# Patient Record
Sex: Female | Born: 1945 | Race: White | Hispanic: No | Marital: Married | Smoking: Never smoker
Health system: Southern US, Community
[De-identification: ages and names within clinical notes are randomized; demographics above are authoritative.]

## PROBLEM LIST (undated history)

## (undated) DIAGNOSIS — C801 Malignant (primary) neoplasm, unspecified: Secondary | ICD-10-CM

## (undated) DIAGNOSIS — E785 Hyperlipidemia, unspecified: Secondary | ICD-10-CM

## (undated) HISTORY — DX: Hyperlipidemia, unspecified: E78.5

## (undated) HISTORY — PX: TONSILLECTOMY: SUR1361

---

## 2015-01-31 ENCOUNTER — Ambulatory Visit
Admission: EM | Admit: 2015-01-31 | Discharge: 2015-01-31 | Disposition: A | Discharge status: Home / Self Care | Payer: Medicare Other

## 2020-12-24 NOTE — Progress Notes (Signed)
 Results reviewed with patient on the evening of 12/19/2020. Labs do not demonstrate clear explanation for right upper quadrant abdominal discomfort. X-ray demonstrates constipation.  Possibility the right upper quadrant pain could be due to constipation. Treatment for constipation reviewed. Standard education guidance.  Conservative measures reviewed.  Precautions reviewed. Notify office if not improving as expected. Keep planned follow-up appointment.

## 2020-12-27 DIAGNOSIS — C801 Malignant (primary) neoplasm, unspecified: Secondary | ICD-10-CM

## 2020-12-27 HISTORY — DX: Malignant (primary) neoplasm, unspecified: C80.1

## 2020-12-28 NOTE — Telephone Encounter (Signed)
 Patient is requesting the following refill Requested Prescriptions   Pending Prescriptions Disp Refills  . alendronate (FOSAMAX) 70 MG tablet [Pharmacy Med Name: ALENDRONATE SODIUM 70 MG TAB] 12 tablet 1    Sig: TAKE 1 TABLET BY MOUTH EVERY SEVEN DAYS.    Recent Visits Date Type Provider Dept  12/19/20 Office Visit Alm Alto Eagles, MD [facility]  07/07/20 Office Visit Alm [place] Eagles, MD [facility]  Showing recent visits within past 365 days with a meds authorizing provider and meeting all other requirements Future Appointments Date Type Provider Dept  01/09/21 Appointment Alm Alto Eagles, MD [facility]  Showing future appointments within next 365 days with a meds authorizing provider and meeting all other requirements    Labs: Not applicable this refill

## 2020-12-29 NOTE — Telephone Encounter (Signed)
 Patient was seen today in clinic for right sided lower chest upper abdominal pain.  She had presented on 12/19/2020 with some upper quadrant abdominal pain.  Please see that office visit notes, labs and x-rays for evaluation.  She came into the office today for reevaluation.  See office notes for details.  In summary she had ongoing pain in the right low chest right upper abdomen and was seen today on 12/29/2020 for evaluation.  CT of the abdomen pelvis with contrast was ordered.  See CT of the abdomen pelvis with contrast from 12/29/2020.  Radiology call report.  Findings significant for:  1. Short segment colonic wall thickening at the hepatic flexure with pericolonic stranding and nodularity, concerning for primary colonic neoplasm. Recommend correlation with colonoscopy. 2. Findings of metastatic disease including multifocal omental, mesenteric, and peritoneal/retroperitoneal soft tissue implants, portocaval and retroperitoneal lymphadenopathy and multiple hepatic metastases. 3. Question lucent lesion involving the S2 vertebral body with soft tissue density extending into the adjacent canal and partially expanded left S2 neural foramina. Recommend dedicated MRI of the sacrum for further characterization. 4. Subpleural 0.7 cm nodule in the lingula is indeterminate. Further evaluation with dedicated CT chest is recommended. 5. A 1.0 cm lesion in the pancreatic neck without pancreatic ductal dilatation. This could be further characterized with MRI of the abdomen.  Results discussed with radiology reading provider.  Patient and husband were called back to clinic and results were discussed in detail.  Of course a surprising diagnosis for patient.  She had prior colonoscopy 03/08/2016.  Results available for review under notes section, operative reports/op note in epic for 03/08/2016.  Patient had some fatigue leading up to this but did not make much of it.  No additional current concerning  symptoms.  Specifically no fevers, nausea, vomiting or clear evidence or symptoms of bowel obstruction.  She had been taking stool softeners, over-the-counter generic MiraLAX and over-the-counter stimulant laxative with results.  No blood or mucus in stools.  No current analgesia for pain.  Denies symptoms clearly treatable to sacral lesion noted on MRI.  She has some general back pain on the right side but does not attribute any lower sacral pain, paresthesias or change in bowel or bladder function or perineal or saddle anesthesia.  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  Discussed options for intervention and further evaluation.  Discussed healthcare systems available for evaluation regionally.  Discussed likelihood of chemotherapy and multiple need for further consultations imaging procedures and therapeutic interventions.  At time discussion patient wanted to go home and speak with daughters and discuss further.  Subsequent phone call in the evening 12/29/2020 to patient who placed me on speaker phone with her self, husband and 2 daughters.  Phone call for approximately 30 minutes providing and reviewing available information options and the impact of the pandemic which has made scheduling more challenging including possibility of delays in evaluation, additional imaging and treatment interventions.  Patient does not report new symptoms or concerns.  Standard education guidance including red flag symptoms, precautions, dietary intake, adequate fiber diet, use of MiraLAX and stool softeners, symptoms to watch for including fever symptoms of bowel obstruction or red flag symptoms related to spinal lesions.  Referrals placed to oncology [facility], oncology externally, referral to general surgery for colonoscopy at [facility], referral for interventional radiology for possible biopsy liver lesions,  MRI of the lumbar spine to evaluate lumbosacral lesions, referral for radiation oncology for additional  consultation given the spinal lesions.  Given that today is Thursday, 12/29/2020 late in the evening advised patient that referral coordinators would likely work on these referrals on Friday, 12/30/2020.  Advised patient if she was not hearing anything back by 01/02/2021 to contact the office.  For now we will also cancel ultrasound has been scheduled for 01/02/2021.  For now we will plan to keep appointment for 01/09/2021 with me however will reschedule as appropriate.  Patient voiced appropriate understanding of assessment and plan of care.  Alm Eagles, MD [facility] [address], [address] [place], [place] [address] Phone [phone]

## 2020-12-31 NOTE — Progress Notes (Signed)
 Results reviewed with patient in person in the office on 12/29/2020.  Follow-up telephone encounter on the evening of 12/29/2020.  See corresponding documentation for details.

## 2021-01-01 NOTE — Progress Notes (Signed)
 Assessment/Plan   Diagnoses and all orders for this visit:  Patient with right upper quadrant abdominal discomfort, acute right low back pain and weight loss.  Clinical concern is for yet identified intra-abdominal pathology given ongoing symptoms without current clear explanation. Standard education guidance.  Conservative measures reviewed.  Will obtain CT of the abdomen pelvis with contrast.  Right upper quadrant abdominal pain -     CT Abdomen Pelvis W Contrast; Future  Acute right-sided low back pain without sciatica -     CT Abdomen Pelvis W Contrast; Future  Weight loss -     CT Abdomen Pelvis W Contrast; Future    Addendum: CT abdomen pelvis with contrast demonstrates findings concerning for an intra-abdominal malignancy metastatic. Please see telephone encounter from 12/29/2020. Patient and husband were called back to the office where the findings related to CT from 12/29/2020 were reviewed. Standard education guidance reviewed. Discussed need for additional evaluation including consultation, likely further imaging and biopsy. Discussed regional health care. Discussed delays associated with the COVID-19 pandemic. Will begin with placing referrals to hematology oncology and oncology externally. Patient husband was At home and talk with their daughters before making further decisions. See telephone encounter from 12/29/2020.  Metastatic malignant neoplasm, unspecified site (CMS-HCC) -     Ambulatory referral to Hematology / Oncology; Future -     Oncology; Future    Diagnoses and plan along with any newly prescribed medication(s), recommendations, orders and therapies were discussed in detail with patient today.  Standard education and anticipatory guidance regarding goals, objectives and potential sequelae were reviewed. Patient voiced appropriate understanding of assessment and plan of care.  Pertinent handouts were given today and reviewed with the patient as  indicated.  The Care Plan and Self-Management goals have been included on the AVS and the AVS has been printed.  Where helpful for monitoring and management I have encouraged the patient to keep regular logs for me to review at their next visit. Any outside resources or referrals needed at this time are noted above. No additional referrals necessary at this time. Patient voiced understanding and all questions have been answered to satisfaction.   To complete this documentation in a timely manner, portions of this encounter may have been completed utilizing Dragon voice recognition software. Please take this into consideration in noting nuances of documentation, grammar, punctuation, and wording. If and when a question exists, or for clarification, please contact the author.  Norma Eagles, MD [facility] at [facility] [address], [address] [place], [place] [address] Phone [phone]  Subjective:   Norma FORBES Eagles, MD  Norma Morales, is a 75 y.o. female  Patient presents: Alone.  Chief Complaint  Patient presents with  . Chest Pain    Patient states that last night she was having a pain under her her right boob. States the pain has gotten a little better. Patient stated that if she took a really deep breath then the pain would get worse.    HPI:  Nursing notes and history reviewed with patient.  Patient presents the office initially describing what sounds like right-sided chest pain and concern for heart.  However further review shows that this is pain in the right upper quadrant of the abdomen below the breast and inferiorly.  Symptoms seem to be worse with deep breathing and lying down.  Patient reports appetite has been okay.  No nausea or vomiting.  She did take MiraLAX and over-the-counter stimulant laxative  which seemed to be beneficial.  Symptoms of the right upper quadrant discomfort have otherwise persisted.  No nausea or vomiting.  Notes  weight loss of 153 down to 148.  She reports she has been somewhat fatigued for the past few weeks but really has not made much of it.  She has some mild right-sided low back pain.  No hematuria.   No lower urinary tract symptoms.  No other complaints. No additional recent health care visits elsewhere. No urgent care or ED visits. No additional palliative, provocative, modifying, quantifying, qualifying, or other related factors. Unless otherwise outlined above patient is pleased with current medication regimen, admits compliance and denies side effects of medication.  The following portions of the patient's history were reviewed and updated as appropriate: allergies, current medications, past family history, past medical history, past social history, past surgical history and problem list.  ROS: Pertinent positives and negatives are as outlined in the HPI above. The remaining balance of the 12 point review of systems is otherwise benign.   PCMH Components:    Goals    . Increase water intake       Family characteristics, patient's social characteristics, patient's cultural characteristics, patient's communication needs, patient's health literacy and behaviors affecting patient's health are outlined under the patient's History Section, Longitudinal Care Plan or Problem List in Epic or as pertinent in History of Present Illness.  Barriers to goals identified and addressed.  See Assessment and Plan for additional pertinent details.  Objective:   Physical Exam  BP 128/64   Pulse 67   Temp 36.7 C (98 F) (Oral)   Resp 18   Ht 162.6 cm (5' 4.02)   Wt 67.4 kg (148 lb 9 oz)   SpO2 98%   BMI 25.49 kg/m  Constitutional: Oriented to person, place, and time. Appears well-developed and well-nourished. No distress.  Head: Normocephalic and atraumatic.  Mouth/Throat: Oropharynx is without mass or concerning lesions. Moist mucous membranes.   Eyes: Conjunctivae are normal. No scleral  icterus.  Neck: Neck supple. Normal range of motion. No JVD present. No thyromegaly present. No concerning neck mass.  Cardiovascular: Normal rate. Regular rhythm. Normal heart sounds. No murmur heard. Pulmonary/Chest: Effort normal. No respiratory distress. Breath sounds normal. No wheezes. No rhonchi. No rales. No focal breath sound changes. No chest tenderness. Abdominal: Soft. Non-distended.  There appears to be a little more prominence of the liver and the right upper quadrant with mild tenderness with moderate to deep palpation. No rebound. No guarding.  Musculoskeletal: No active synovitis. No concerning joint erythema, warmth or swelling.  Range of motion is without evidence of significant impairment.  Strength is without evidence of significant impairment.  No edema of the extremities.  Lymphadenopathy: There is no lymphadenopathy about the head or neck. Neurological: Alert and oriented to person, place, and time. No cranial nerve deficit. Normal muscle tone. Normal coordination. Skin: Skin is warm and dry. No pallor. No rash noted.  Psychiatric: Normal mood, affect and behavior. Judgment and thought content normal.

## 2021-01-02 ENCOUNTER — Telehealth: Payer: Self-pay | Admitting: Oncology

## 2021-01-02 NOTE — Telephone Encounter (Signed)
[facility] Scheduling Phone Note  I contacted Norma Morales regarding a referral from Dr. Jene Every [facility] [place]) for purpose of evaluation and work up of findings concerning for colonic neoplasm.  Norma Morales was aware of her referral and reason.  Information on reason for referral was not necessary.  Patient was informed about the [facility] and the intent being to complete a diagnostic/prognostic work-up for her suspicious findings.  She is aware her appointment is with our Physician Assistant to identify a diagnostic plan of care and to arrange further oncologist or specialist care as indicated.  I confirmed with Norma Morales she has transportation to her [facility] appointment.  Patient is aware to bring a list of medications, as well as insurance cards.  Visitor policy reviewed with patient as well, including what to expect on arrival to the [facility].  Thank you for the referral, and we look forward to meeting Norma Morales and completing her diagnostic work-up and arranging her subsequent appointment with our oncologist team.  Diagnostic Clinic Specific Info:  Best contact/way to contact patient:  [phone] [facility] updated to reflect?:  not applicable Is there a HC POA?:  No  Location of Diagnostic Clinic Appointment and Contact Info Provided to Patient: Norma Morales at [facility], [place] [address] [phone]   Clinical referral information:  All referral records, including imaging referenced below are in Media > AMB New Patient  11/3 CT Abdomen  Short segment colonic wall thickening at the hepatic flexure with pericolonic stranding and nodularity, concerning for primary colonic neoplasm.  Findings of metastatic disease including multifocal omental, mesenteric, and peritoneal/retroperitoneal soft tissue implants, portocaval and retroperitoneal lymphadenopathy and  multiple hepatic metastases.  Lesion in pancreatic neck as well.

## 2021-01-04 ENCOUNTER — Inpatient Hospital Stay: Payer: Medicare HMO

## 2021-01-04 ENCOUNTER — Other Ambulatory Visit: Payer: Self-pay

## 2021-01-04 ENCOUNTER — Encounter: Payer: Self-pay | Admitting: Physician Assistant

## 2021-01-04 ENCOUNTER — Inpatient Hospital Stay: Payer: Medicare HMO | Attending: Physician Assistant | Admitting: Physician Assistant

## 2021-01-04 VITALS — BP 140/65 | HR 76 | Temp 99.6°F | Resp 16 | Ht 62.0 in | Wt 146.3 lb

## 2021-01-04 DIAGNOSIS — C787 Secondary malignant neoplasm of liver and intrahepatic bile duct: Secondary | ICD-10-CM | POA: Insufficient documentation

## 2021-01-04 DIAGNOSIS — C801 Malignant (primary) neoplasm, unspecified: Secondary | ICD-10-CM | POA: Insufficient documentation

## 2021-01-04 DIAGNOSIS — R970 Elevated carcinoembryonic antigen [CEA]: Secondary | ICD-10-CM | POA: Insufficient documentation

## 2021-01-04 DIAGNOSIS — C7880 Secondary malignant neoplasm of unspecified digestive organ: Secondary | ICD-10-CM

## 2021-01-04 DIAGNOSIS — C799 Secondary malignant neoplasm of unspecified site: Secondary | ICD-10-CM

## 2021-01-04 DIAGNOSIS — Z79899 Other long term (current) drug therapy: Secondary | ICD-10-CM | POA: Insufficient documentation

## 2021-01-04 DIAGNOSIS — C786 Secondary malignant neoplasm of retroperitoneum and peritoneum: Secondary | ICD-10-CM | POA: Diagnosis present

## 2021-01-04 DIAGNOSIS — R1011 Right upper quadrant pain: Secondary | ICD-10-CM | POA: Insufficient documentation

## 2021-01-04 DIAGNOSIS — R978 Other abnormal tumor markers: Secondary | ICD-10-CM | POA: Insufficient documentation

## 2021-01-04 DIAGNOSIS — R933 Abnormal findings on diagnostic imaging of other parts of digestive tract: Secondary | ICD-10-CM | POA: Diagnosis not present

## 2021-01-04 DIAGNOSIS — R590 Localized enlarged lymph nodes: Secondary | ICD-10-CM | POA: Diagnosis not present

## 2021-01-04 DIAGNOSIS — K6389 Other specified diseases of intestine: Secondary | ICD-10-CM

## 2021-01-04 LAB — CBC WITH DIFFERENTIAL (CANCER CENTER ONLY)
Abs Immature Granulocytes: 0.13 10*3/uL — ABNORMAL HIGH (ref 0.00–0.07)
Basophils Absolute: 0.1 10*3/uL (ref 0.0–0.1)
Basophils Relative: 1 %
Eosinophils Absolute: 0.4 10*3/uL (ref 0.0–0.5)
Eosinophils Relative: 3 %
HCT: 33.3 % — ABNORMAL LOW (ref 36.0–46.0)
Hemoglobin: 10.8 g/dL — ABNORMAL LOW (ref 12.0–15.0)
Immature Granulocytes: 1 %
Lymphocytes Relative: 11 %
Lymphs Abs: 1.2 10*3/uL (ref 0.7–4.0)
MCH: 27 pg (ref 26.0–34.0)
MCHC: 32.4 g/dL (ref 30.0–36.0)
MCV: 83.3 fL (ref 80.0–100.0)
Monocytes Absolute: 0.9 10*3/uL (ref 0.1–1.0)
Monocytes Relative: 8 %
Neutro Abs: 8.4 10*3/uL — ABNORMAL HIGH (ref 1.7–7.7)
Neutrophils Relative %: 76 %
Platelet Count: 378 10*3/uL (ref 150–400)
RBC: 4 MIL/uL (ref 3.87–5.11)
RDW: 13.2 % (ref 11.5–15.5)
WBC Count: 11 10*3/uL — ABNORMAL HIGH (ref 4.0–10.5)
nRBC: 0 % (ref 0.0–0.2)

## 2021-01-04 LAB — CMP (CANCER CENTER ONLY)
ALT: 54 U/L — ABNORMAL HIGH (ref 0–44)
AST: 74 U/L — ABNORMAL HIGH (ref 15–41)
Albumin: 3.4 g/dL — ABNORMAL LOW (ref 3.5–5.0)
Alkaline Phosphatase: 228 U/L — ABNORMAL HIGH (ref 38–126)
Anion gap: 10 (ref 5–15)
BUN: 6 mg/dL — ABNORMAL LOW (ref 8–23)
CO2: 26 mmol/L (ref 22–32)
Calcium: 9.3 mg/dL (ref 8.9–10.3)
Chloride: 102 mmol/L (ref 98–111)
Creatinine: 0.67 mg/dL (ref 0.44–1.00)
GFR, Estimated: >60 mL/min (ref >60)
Glucose, Bld: 91 mg/dL (ref 70–99)
Potassium: 4 mmol/L (ref 3.5–5.1)
Sodium: 138 mmol/L (ref 135–145)
Total Bilirubin: 0.4 mg/dL (ref 0.3–1.2)
Total Protein: 7.2 g/dL (ref 6.5–8.1)

## 2021-01-04 LAB — CEA (IN HOUSE-CHCC): CEA (CHCC-In House): 40.71 ng/mL — ABNORMAL HIGH (ref 0.00–5.00)

## 2021-01-04 NOTE — Progress Notes (Signed)
[place] [phone]   [phone]  INITIAL CONSULTATION:  Patient Care Team: Dustin Flock, Hershal Coria as PCP - General (Physician Assistant)  CHIEF COMPLAINTS/PURPOSE OF CONSULTATION:  Abnormal CT imaging concerning for primary colonic neoplasm.   ONCOLOGIC HISTORY: 1) 12/19/2020: Presented to PCP with RUQ abdominal pain and constipation.   2) 12/28/2020: CT abdomen/pelvis: Short segment colonic wall thickening at the hepatic flexure with pericolonic stranding and nodularity, concerning for primary colonic neoplasm. Recommend correlation with colonoscopy.  Findings of metastatic disease including multifocal omental, mesenteric, and peritoneal/retroperitoneal soft tissue implants, portocaval and retroperitoneal lymphadenopathy and multiple hepatic metastases. Question lucent lesion involving the S2 vertebral body with soft tissue density extending into the adjacent canal and partially expanded left S2 neural foramina. Recommend dedicated MRI of the sacrum for further characterization. Subpleural 0.7 cm nodule in the lingula is indeterminate. Further evaluation with dedicated CT chest is recommended.  A 1.0 cm lesion in the pancreatic neck without pancreatic ductal dilatation. This could be further characterized with MRI of the abdomen.  3) 01/04/2021: Establish care at [facility].    HISTORY OF PRESENTING ILLNESS:  Norma Morales 75 y.o. female with medical history significant for hyperlipidemia presents to the clinic for evaluation for CT imaging concerning for primary colonic neoplasm with metastatic disease.  Patient is accompanied by her 2 daughters for this visit.  On exam today, Norma Morales reports progressive fatigue for the last 2 weeks.  She can complete her daily activities independently but has noticed she requires frequent resting.  Her appetite has decreased with an approximate 5 to 6 pound weight loss over the  last 3 to 4 months.  She denies any nausea or vomiting.  In the last 2 weeks, patient has noticed intermittent episodes of right upper quadrant pain that she rates as 4 out of 10 on the pain scale.  She does not require any pain medication for the abdominal pain.  Her bowel movements are unchanged without any diarrhea or constipation.  She denies easy bruising or signs of bleeding.  Patient denies neuropathy or peripheral edema.  Patient had 1 episode of right sided chest pain with deep inspiration that has not reoccurred.  She has chronic low back pain that has been present for several years but pain has worsened in the last couple of weeks.  She denies any fevers, chills, night sweats, shortness of breath, cough.  She has no other complaints.  Rest of the 10 point ROS is below.  MEDICAL HISTORY:  Past Medical History:  Diagnosis Date   Hyperlipidemia     SURGICAL HISTORY: Past Surgical History:  Procedure Laterality Date   TONSILLECTOMY      SOCIAL HISTORY: Social History   Socioeconomic History   Marital status: Married    Spouse name: Not on file   Number of children: Not on file   Years of education: Not on file   Highest education level: Not on file  Occupational History   Not on file  Tobacco Use   Smoking status: Never   Smokeless tobacco: Never  Substance and Sexual Activity   Alcohol use: Never   Drug use: Never   Sexual activity: Not on file  Other Topics Concern   Not on file  Social History Narrative   Not on file   Social Determinants of Health   Financial Resource Strain: Not on file  Food Insecurity: Not on file  Transportation Needs: Not on file  Physical  Activity: Not on file  Stress: Not on file  Social Connections: Not on file  Intimate Partner Violence: Not on file    FAMILY HISTORY: History reviewed. No pertinent family history.  ALLERGIES:  is allergic to aspirin.  MEDICATIONS:  Current Outpatient Medications  Medication Sig Dispense  Refill   alendronate (FOSAMAX) 70 MG tablet TAKE 1 TABLET BY MOUTH EVERY SEVEN DAYS.     ascorbic acid (VITAMIN C) 500 MG tablet Take by mouth.     atenolol (TENORMIN) 25 MG tablet Take by mouth.     b complex vitamins capsule Take 1 capsule by mouth daily.     bisacodyl (DULCOLAX) 5 MG EC tablet Take by mouth.     Calcium Carbonate (CALCIUM 600 PO) Take by mouth daily.     Cholecalciferol 25 MCG (1000 UT) capsule Take by mouth.     Coenzyme Q10 (COQ10 PO) Take 50 mg by mouth daily.     ezetimibe (ZETIA) 10 MG tablet Take 1 tablet by mouth daily.     Glucosamine-Chondroitin (GLUCOSAMINE CHONDR COMPLEX PO) Take by mouth daily.     Omega-3 1000 MG CAPS Take by mouth.     polyethylene glycol powder (GLYCOLAX/MIRALAX) 17 GM/SCOOP powder Take by mouth.     Probiotic Product (PROBIOTIC BLEND PO) Take by mouth daily.     Resveratrol-Quercetin 100-100 MG TABS Take by mouth.     Vitamin E 268 MG (400 UNIT) CAPS Take by mouth daily.     Zinc 50 MG TABS Take by mouth daily.     No current facility-administered medications for this visit.    REVIEW OF SYSTEMS:   Constitutional: ( - ) fevers, ( - )  chills , ( - ) night sweats Eyes: ( - ) blurriness of vision, ( - ) double vision, ( - ) watery eyes Ears, nose, mouth, throat, and face: ( - ) mucositis, ( - ) sore throat Respiratory: ( - ) cough, ( - ) dyspnea, ( - ) wheezes Cardiovascular: ( - ) palpitation, ( - ) chest discomfort, ( - ) lower extremity swelling Gastrointestinal:  ( - ) nausea, ( - ) heartburn, ( - ) change in bowel habits Skin: ( - ) abnormal skin rashes Lymphatics: ( - ) new lymphadenopathy, ( - ) easy bruising Neurological: ( - ) numbness, ( - ) tingling, ( - ) new weaknesses Behavioral/Psych: ( - ) mood change, ( - ) new changes  All other systems were reviewed with the patient and are negative.  PHYSICAL EXAMINATION: ECOG PERFORMANCE STATUS: 1 - Symptomatic but completely ambulatory  Vitals:   01/04/21 1253  BP: 140/65   Pulse: 76  Resp: 16  Temp: 99.6 F (37.6 C)  SpO2: 100%   Filed Weights   01/04/21 1253  Weight: 146 lb 4.8 oz (66.4 kg)    GENERAL: well appearing female in NAD  SKIN: skin color, texture, turgor are normal, no rashes or significant lesions EYES: conjunctiva are pink and non-injected, sclera clear OROPHARYNX: no exudate, no erythema; lips, buccal mucosa, and tongue normal  NECK: supple, non-tender LYMPH:  no palpable lymphadenopathy in the cervical, axillary or supraclavicular lymph nodes.  LUNGS: clear to auscultation and percussion with normal breathing effort HEART: regular rate & rhythm and no murmurs and no lower extremity edema ABDOMEN: soft, non-distended, normal bowel sounds. Tenderness to palpation in RUQ and epigastric region. No hepatosplenomegaly.  Musculoskeletal: no cyanosis of digits and no clubbing  PSYCH: alert & oriented x 3, fluent  speech NEURO: no focal motor/sensory deficits  LABORATORY DATA:  I have reviewed the data as listed CBC Latest Ref Rng & Units 01/04/2021  WBC 4.0 - 10.5 K/uL 11.0(H)  Hemoglobin 12.0 - 15.0 g/dL 10.8(L)  Hematocrit 36.0 - 46.0 % 33.3(L)  Platelets 150 - 400 K/uL 378    CMP Latest Ref Rng & Units 01/04/2021  Glucose 70 - 99 mg/dL 91  BUN 8 - 23 mg/dL 6(L)  Creatinine 0.44 - 1.00 mg/dL 0.67  Sodium 135 - 145 mmol/L 138  Potassium 3.5 - 5.1 mmol/L 4.0  Chloride 98 - 111 mmol/L 102  CO2 22 - 32 mmol/L 26  Calcium 8.9 - 10.3 mg/dL 9.3  Total Protein 6.5 - 8.1 g/dL 7.2  Total Bilirubin 0.3 - 1.2 mg/dL 0.4  Alkaline Phos 38 - 126 U/L 228(H)  AST 15 - 41 U/L 74(H)  ALT 0 - 44 U/L 54(H)     RADIOGRAPHIC STUDIES: I have personally reviewed the radiological images as listed and agreed with the findings in the report. No results found.  OSI CT abdomen/pelvis from 12/29/2020:  1. Short segment colonic wall thickening at the hepatic flexure with pericolonic stranding and nodularity, concerning for primary colonic neoplasm.  Recommend correlation with colonoscopy.  2. Findings of metastatic disease including multifocal omental, mesenteric, and peritoneal/retroperitoneal soft tissue implants, portocaval and retroperitoneal lymphadenopathy and multiple hepatic metastases.  3. Question lucent lesion involving the S2 vertebral body with soft tissue density extending into the adjacent canal and partially expanded left S2 neural foramina. Recommend dedicated MRI of the sacrum for further characterization.  4. Subpleural 0.7 cm nodule in the lingula is indeterminate. Further evaluation with dedicated CT chest is recommended.  5. A 1.0 cm lesion in the pancreatic neck without pancreatic ductal dilatation. This could be further characterized with MRI of the abdomen.   ASSESSMENT & PLAN Norma Morales is a 75 y.o. female who presents to the diagnostic clinic for abnormal CT imaging that is concerning for primary colonic neoplasm.  I reviewed the CT scan results in detail the patient and her daughters that shows multifocal metastatic disease involving the liver, peritoneum, lymph nodes and possibly S2 vertebrae.  Patient is scheduled for a diagnostic colonoscopy with Dr. Kathreen Devoid (general surgery) this Friday on 01/06/2021.  If colonoscopy is nondiagnostic, we will need to pursue another location for a biopsy.   Additionally, staging needs to be complete with a CT scan of the chest. Dr. Marian Sorrow has ordered an MRI of the lumbar spine to further evaluate the questionable lesion involving the S2 vertebral, currently scheduled for 01/27/2021.   I briefly reviewed the goals of therapy with metastatic colon cancer which in most cases is not curable.  Goal of treatment is to slow the growth and improve associated symptoms.  The mainstay treatment will include intravenous chemotherapy and she will need Port-A-Cath.  Once we get pathologic confirmation, we will request molecular studies.   #Abnormal CT scan concerning for colonic neoplasm with  metastatic disease: --Colonoscopy scheduled for 01/06/2021 with Dr. Marian Sorrow ([facility]) --MRI lumbar spine scheduled for 01/27/2021 to evaluate S2 vertebral lesion.  --Labs today to check CBC, CMP, CEA and CA19-9 levels --Need CT chest to complete staging --RTC in 1 week to follow up with Dr. Lorenso Courier to review pathology and finalize treatment recommendations.   #Pancreatic neck mass: --seen on CT scan on 12/29/2020. No evidence of ductal dilation. --Need to consider MRI abdomen to further characterize.   #RUQ pain: --Likely secondary  to hepatic metastases.  --Currently managed with OTC analgesics.   #Supportive care: --Placed order for port a cath placement --Placed referral to nutrition  Orders Placed This Encounter  Procedures   IR Fluoro Guide CV Line Right    Indicate type of CVC ordering    Standing Status:   Future    Standing Expiration Date:   01/05/2022    Order Specific Question:   Reason for exam:    Answer:   port placement for chemotherapy    Order Specific Question:   Preferred Imaging Location?    Answer:   [facility]   CT Chest W Contrast    Standing Status:   Future    Standing Expiration Date:   01/05/2022    Order Specific Question:   If indicated for the ordered procedure, I authorize the administration of contrast media per Radiology protocol    Answer:   Yes    Order Specific Question:   Preferred imaging location?    Answer:   [facility]   CBC with Differential ([place] Only)    Standing Status:   Future    Number of Occurrences:   1    Standing Expiration Date:   01/04/2022   CEA (IN HOUSE-[facility])FOR [facility] ONLY    Standing Status:   Future    Number of Occurrences:   1    Standing Expiration Date:   01/04/2022   CMP ([place] only)    Standing Status:   Future    Number of Occurrences:   1    Standing Expiration Date:   01/04/2022   CA 19.9    Standing Status:   Future    Number of Occurrences:   1     Standing Expiration Date:   01/04/2022   Ambulatory Referral to [facility] Nutrition    Referral Priority:   Routine    Referral Type:   Consultation    Referral Reason:   Specialty Services Required    Number of Visits Requested:   1    All questions were answered. The patient knows to call the clinic with any problems, questions or concerns.  I have spent a total of 60 minutes minutes of face-to-face and non-face-to-face time, preparing to see the patient, obtaining and/or reviewing separately obtained history, performing a medically appropriate examination, counseling and educating the patient, ordering tests/procedures, referring and communicating with other health care professionals, documenting clinical information in the electronic health record,  and care coordination.   Dede Query, PA-C Department of Hematology/Oncology [place] at [facility] Phone: [phone]  Patient was seen with Dr. Lorenso Courier.  I have read the above note and personally examined the patient. I agree with the assessment and plan as noted above.  Briefly Norma Morales is a 75 year old female who presents for evaluation of metastatic carcinoma, work-up in process for possible adenocarcinoma of the colon.  She recently had a CT scan of the abdomen concerning for metastatic colon cancer with thickening of the colonic mucosa.  She is currently scheduled for a colonoscopy on 01/06/2021 in order to obtain a biopsy to confirm diagnosis.  Today we will plan to complete staging with CT scan of the chest.  Additionally we will order tumor markers CEA and CA 19-9.  We will plan to see the patient back next week to discuss the results of the biopsy.  In the interim we will also order a port in preparation for starting chemotherapy.  The patient and her daughters voiced understanding of this plan moving forward.   Ledell Peoples, MD Department of Hematology/Oncology [place] at [facility] Phone: [phone] Pager: [phone] Email: [email or web address]

## 2021-01-05 ENCOUNTER — Encounter: Payer: Self-pay | Admitting: Physician Assistant

## 2021-01-05 ENCOUNTER — Ambulatory Visit
Admission: RE | Admit: 2021-01-05 | Discharge: 2021-01-05 | Disposition: A | Discharge status: Home / Self Care | Payer: Self-pay | Source: Ambulatory Visit | Attending: Physician Assistant | Admitting: Physician Assistant

## 2021-01-05 DIAGNOSIS — K6389 Other specified diseases of intestine: Secondary | ICD-10-CM

## 2021-01-05 DIAGNOSIS — C799 Secondary malignant neoplasm of unspecified site: Secondary | ICD-10-CM | POA: Insufficient documentation

## 2021-01-05 LAB — CANCER ANTIGEN 19-9: CA 19-9: 180 U/mL — ABNORMAL HIGH (ref 0–35)

## 2021-01-05 NOTE — Progress Notes (Signed)
Placed order for CT imaging to be downloaded to MR.

## 2021-01-06 ENCOUNTER — Telehealth: Payer: Self-pay | Admitting: Physician Assistant

## 2021-01-06 DIAGNOSIS — M899 Disorder of bone, unspecified: Secondary | ICD-10-CM

## 2021-01-06 NOTE — Telephone Encounter (Signed)
Scheduled per sch msg. Called and spoke with patient. Confifmed appt

## 2021-01-06 NOTE — Telephone Encounter (Signed)
I called and spoke to [name] to review the lab results form 01/04/2021.  CBC shows normocytic anemia with a hemoglobin of 10.8 mild leukocytosis with neutrophil predominance.  Additionally, CMP reveals mild elevation of liver enzymes which could be coming from hepatic metastasis seen on CT imaging.  Both CEA and CA 19-9 tumor markers were elevated.  Patient is scheduled for colonoscopy later today.  I explained that we will await the pathology results and plan to have a follow-up in one 1 week to finalize treatment recommendations.  Patient adds that she would like for [place] to schedule the MRI of the sacrum as [facility] has currently scheduled the scan for 01/27/2021.  She has asked for [place] to expedite the scan if possible.  We are in the process of scheduling staging CT scan of the chest.

## 2021-01-09 ENCOUNTER — Other Ambulatory Visit: Payer: Self-pay

## 2021-01-09 ENCOUNTER — Telehealth: Payer: Self-pay | Admitting: Nutrition

## 2021-01-09 ENCOUNTER — Other Ambulatory Visit: Payer: Self-pay | Admitting: Physician Assistant

## 2021-01-09 DIAGNOSIS — C7989 Secondary malignant neoplasm of other specified sites: Secondary | ICD-10-CM

## 2021-01-09 NOTE — Progress Notes (Signed)
Opened in error

## 2021-01-09 NOTE — Telephone Encounter (Signed)
Cancelled nutrition appt per 11/14 pt request, pt said they will call back when they want to r/s

## 2021-01-10 ENCOUNTER — Other Ambulatory Visit: Payer: Self-pay

## 2021-01-10 ENCOUNTER — Ambulatory Visit (HOSPITAL_COMMUNITY)
Admission: RE | Admit: 2021-01-10 | Discharge: 2021-01-10 | Disposition: A | Discharge status: Home / Self Care | Payer: Medicare HMO | Source: Ambulatory Visit | Attending: Physician Assistant | Admitting: Physician Assistant

## 2021-01-10 DIAGNOSIS — M899 Disorder of bone, unspecified: Secondary | ICD-10-CM | POA: Insufficient documentation

## 2021-01-10 IMAGING — MR MR SACRUM / SI JOINTS WO/W CM
9 series · 48 of 48 positions shown · non-contrast
Comparison: None.

CT examination dated [DATE]

CLINICAL DATA: Suspected bone lesion seen on prior CT examination

EXAM:
MRI SACRUM WITH AND WITHOUT CONTRAST
TECHNIQUE: Multiplanar multi-sequence MR imaging of the sacrum was performed
without and with intravenous contrast.

[Series 4: T1 · axial · 4.0mm · 0.94mm/px · z∈[-118,+13]mm · 6 of 34 slices shown (1 of 2)]
[im 1/34]
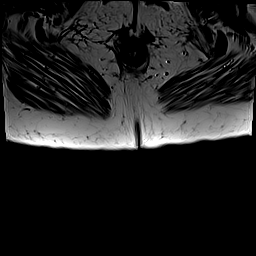
[im 7/34]
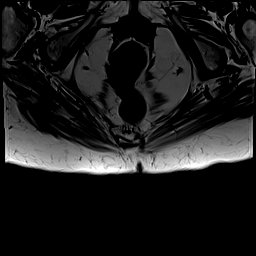
[im 14/34]
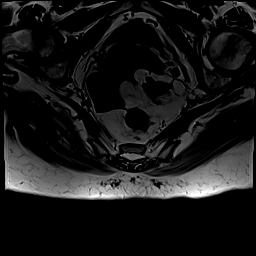
[im 20/34]
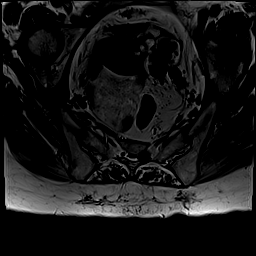
[im 27/34]
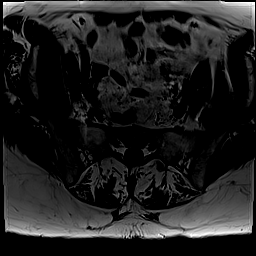
[im 34/34]
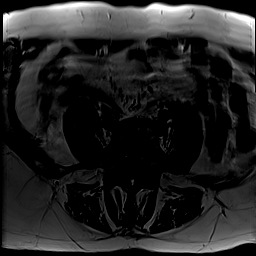

[Series 5: T2 fat-sat · axial · 4.0mm · 0.94mm/px · z∈[-118,+13]mm · 5 of 34 slices shown (1 of 2)]
[im 1/34]
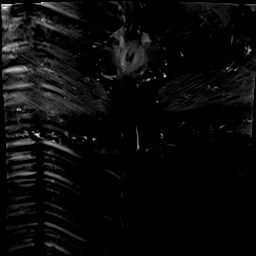
[im 9/34]
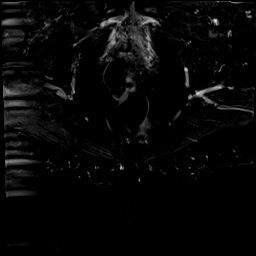
[im 17/34]
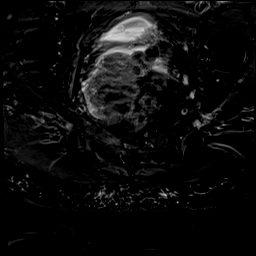
[im 25/34]
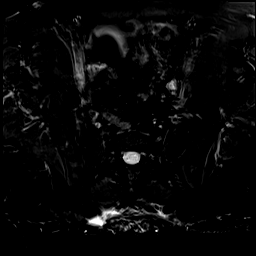
[im 34/34]
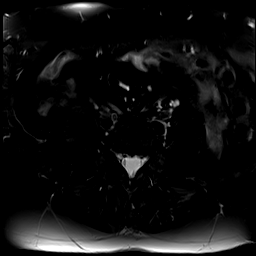

[Series 6: T1 · oblique · 3.0mm · 0.86mm/px · 6 of 38 slices shown (2 of 2)]
[im 1/38]
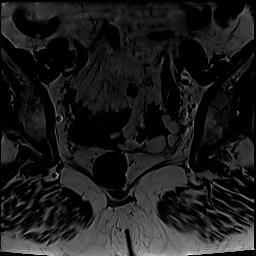
[im 8/38]
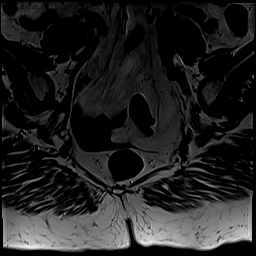
[im 15/38]
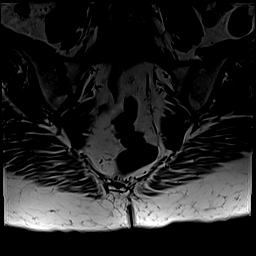
[im 23/38]
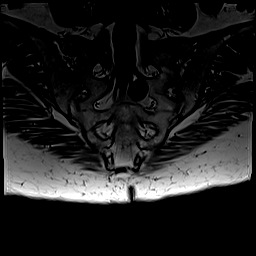
[im 30/38]
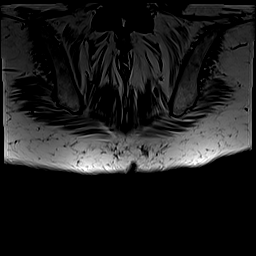
[im 38/38]
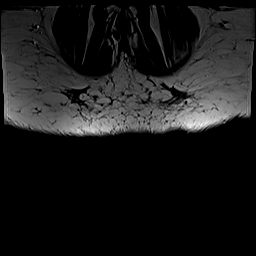

[Series 7: STIR · oblique · 3.0mm · 0.43mm/px · 6 of 38 slices shown]
[im 1/38]
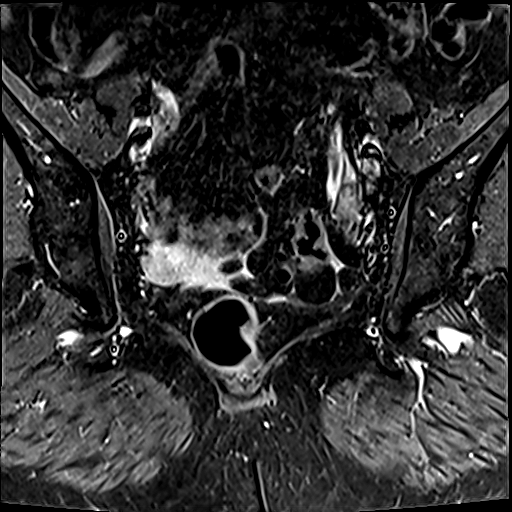
[im 8/38]
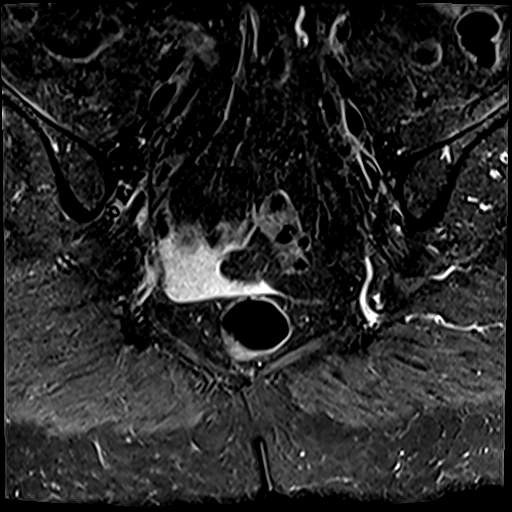
[im 15/38]
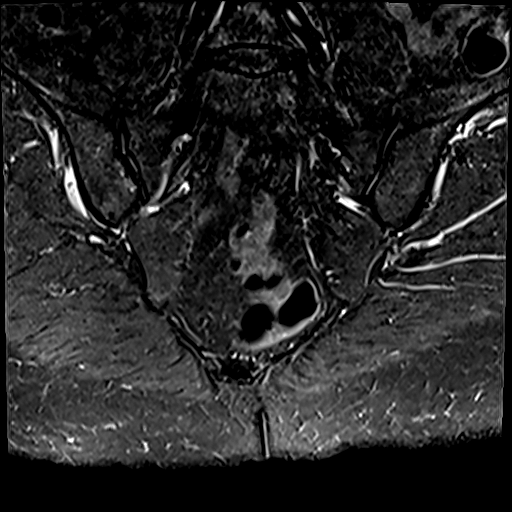
[im 23/38]
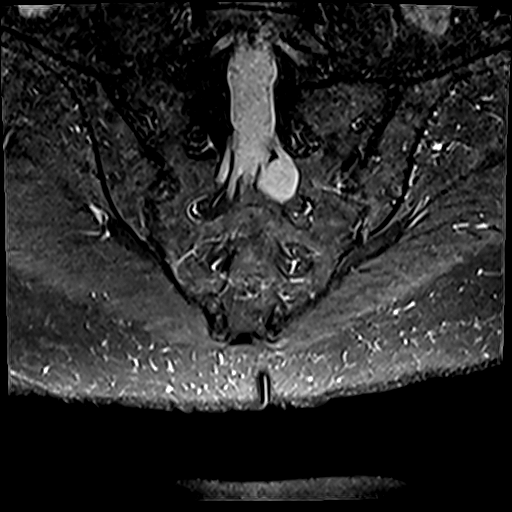
[im 30/38]
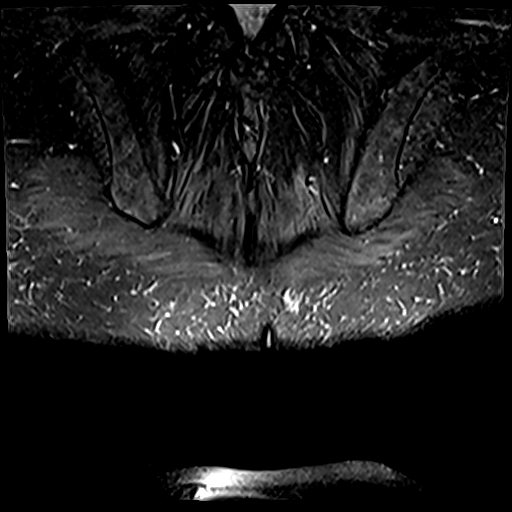
[im 38/38]
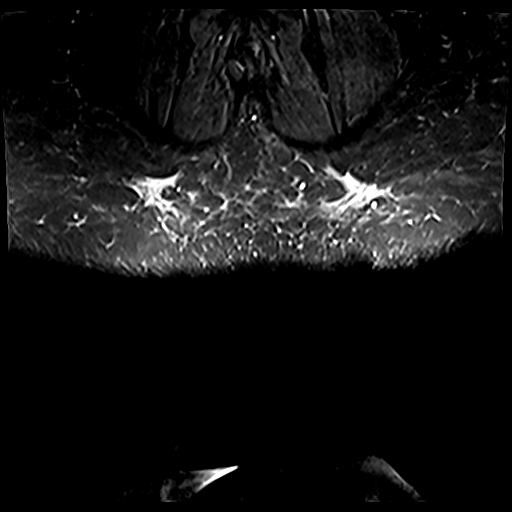

[Series 8: T2 · axial · 4.0mm · 0.94mm/px · z∈[-118,+13]mm · 5 of 34 slices shown]
[im 1/34]
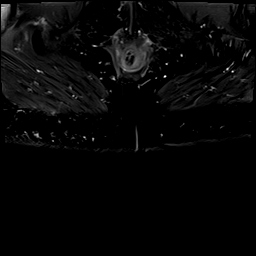
[im 9/34]
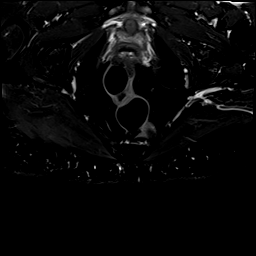
[im 17/34]
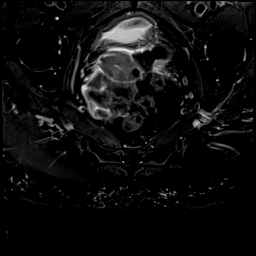
[im 25/34]
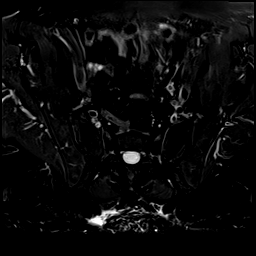
[im 34/34]
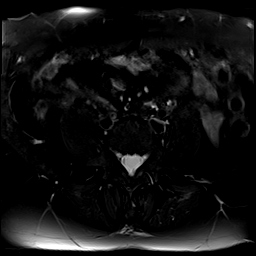

[Series 9: T2 fat-sat · sagittal · 4.0mm · 0.75mm/px · 4 of 29 slices shown (2 of 2)]
[im 1/29]
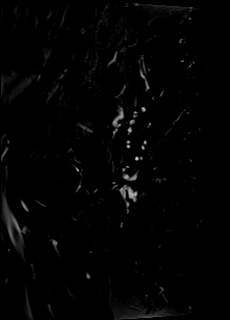
[im 10/29]
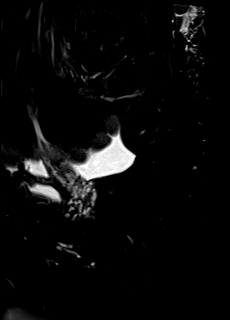
[im 19/29]
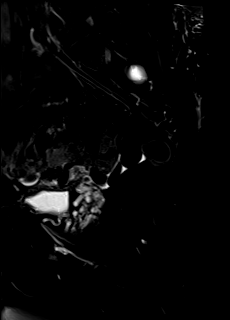
[im 29/29]
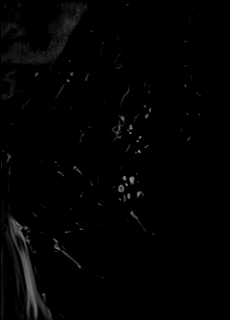

[Series 10: T1 fat-sat · axial · non-contrast · 4.0mm · 0.94mm/px · z∈[-62,+101]mm · 5 of 34 slices shown]
[im 1/34]
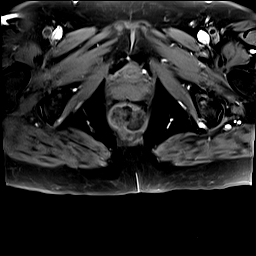
[im 9/34]
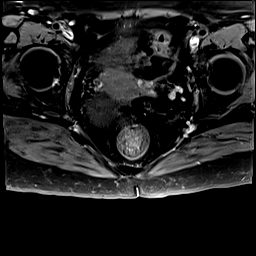
[im 17/34]
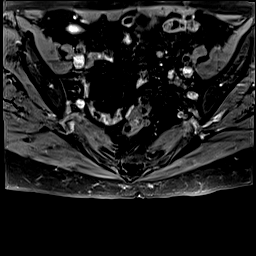
[im 25/34]
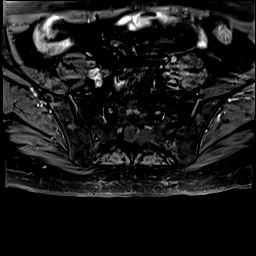
[im 34/34]
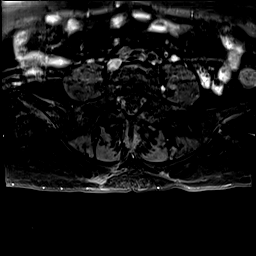

[Series 11: T1 fat-sat post-contrast · axial · 4.0mm · 0.94mm/px · z∈[-118,+13]mm · 5 of 34 slices shown (1 of 2)]
[im 1/34]
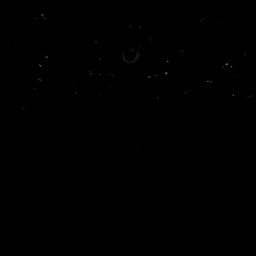
[im 9/34]
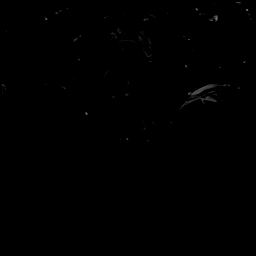
[im 17/34]
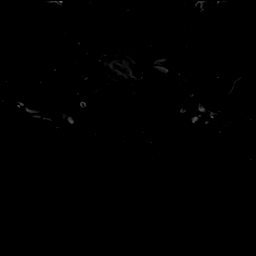
[im 25/34]
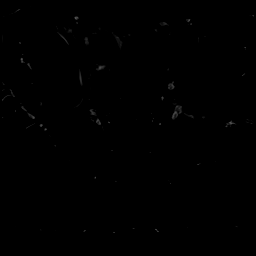
[im 34/34]
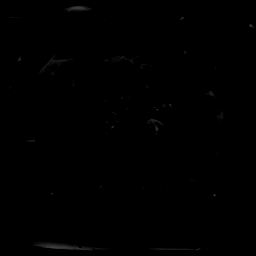

[Series 12: T1 fat-sat post-contrast · oblique · 3.0mm · 0.86mm/px · 6 of 38 slices shown (2 of 2)]
[im 1/38]
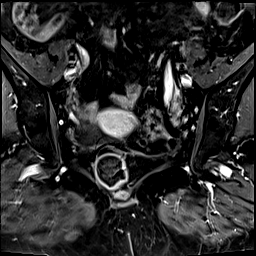
[im 8/38]
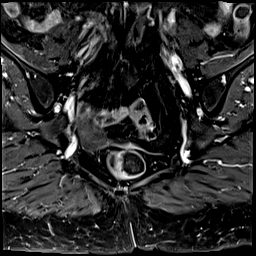
[im 15/38]
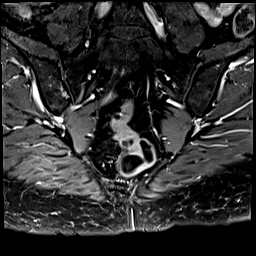
[im 23/38]
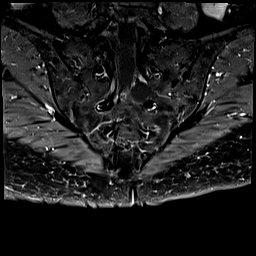
[im 30/38]
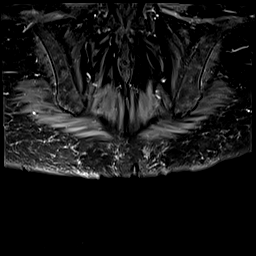
[im 38/38]
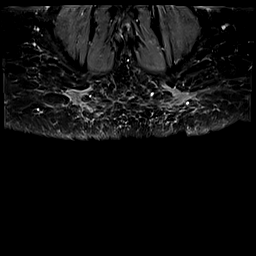

[48 of 48 positions shown; findings below may reference images not displayed]

FINDINGS: Bones/Joint/Cartilage

No fracture or dislocation. Normal alignment. No joint effusion. No
marrow signal abnormality.

No SI joint widening or erosive changes. No subchondral reactive
marrow changes. No SI joint effusion. There is a T1 hypointense, T2
hyperintense nonenhancing structure abutting the left dorsal root of
S2 measuring approximately 1.3 x 1.6 x 1.7 cm, most consistent with
a Tarlov cyst, a benign process.

No focal lumbar spine abnormality. Degenerate disc disease of the
L5-S1.

Ligaments, Muscles and Tendons

Muscles are normal. No muscle atrophy. No muscle edema. Piriformis
muscles are normal bilaterally without signal abnormality.

Soft tissue
No fluid collection or hematoma. No soft tissue mass. Normal
neurovascular bundles.

Visualized pelvic viscera are unremarkable.
IMPRESSION: 1.  Tarlov cyst about left dorsal root of S2, a benign process.

2.  No suspicious osseous lesion.

## 2021-01-10 MED ORDER — GADOBUTROL 1 MMOL/ML IV SOLN
7.0000 mL | Freq: Once | INTRAVENOUS | Status: AC | PRN
  Administered 2021-01-10: 7 mL via INTRAVENOUS

## 2021-01-10 NOTE — Progress Notes (Signed)
I spoke with Norma Morales and reviewed CT Chest appt, date, time, location, and instructions.  I also reviewed her upcoming appts at [facility]. She verbalized understanding.

## 2021-01-11 ENCOUNTER — Encounter (HOSPITAL_COMMUNITY): Payer: Self-pay

## 2021-01-11 ENCOUNTER — Telehealth: Payer: Self-pay | Admitting: Physician Assistant

## 2021-01-11 ENCOUNTER — Ambulatory Visit (HOSPITAL_COMMUNITY)
Admission: RE | Admit: 2021-01-11 | Discharge: 2021-01-11 | Disposition: A | Discharge status: Home / Self Care | Payer: Medicare HMO | Source: Ambulatory Visit | Attending: Physician Assistant | Admitting: Physician Assistant

## 2021-01-11 DIAGNOSIS — R160 Hepatomegaly, not elsewhere classified: Secondary | ICD-10-CM

## 2021-01-11 DIAGNOSIS — C7989 Secondary malignant neoplasm of other specified sites: Secondary | ICD-10-CM | POA: Diagnosis present

## 2021-01-11 IMAGING — CT CT CHEST W/ CM
1 of 4 series · 13 of 31 positions shown, 17 images · IV contrast (OMNIPAQUE)
Comparison: None.

CLINICAL DATA: Staging for metastatic disease in the abdomen/pelvis

EXAM:
CT CHEST WITH CONTRAST
TECHNIQUE: Multidetector CT imaging of the chest was performed during
intravenous contrast administration.
CONTRAST:  60mL OMNIPAQUE IOHEXOL 350 MG/ML SOLN

[Series 2: axial st · axial · 0.67mm/px · z∈[-316,-22]mm · 13 of 173 slices shown, 17 images]
[im 13/173  mediastinal]
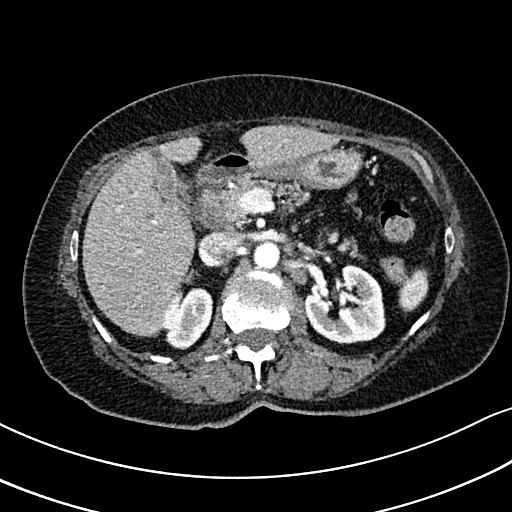
[im 13/173  lung]
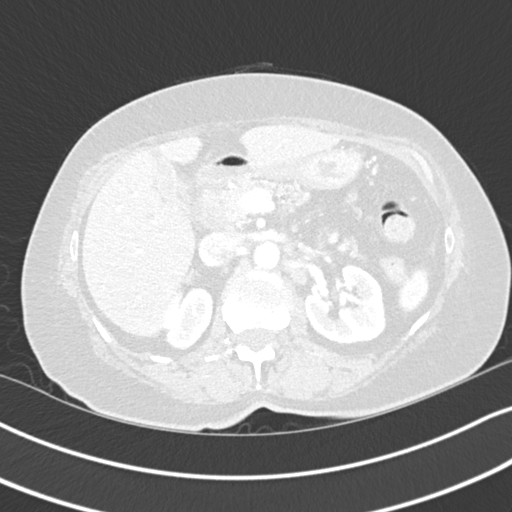
[im 25/173  lung]
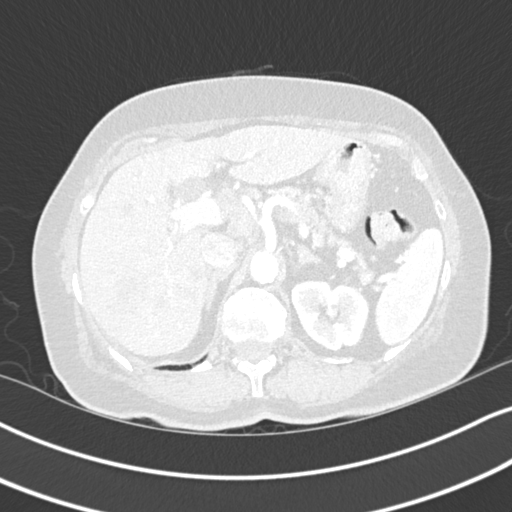
[im 37/173  lung]
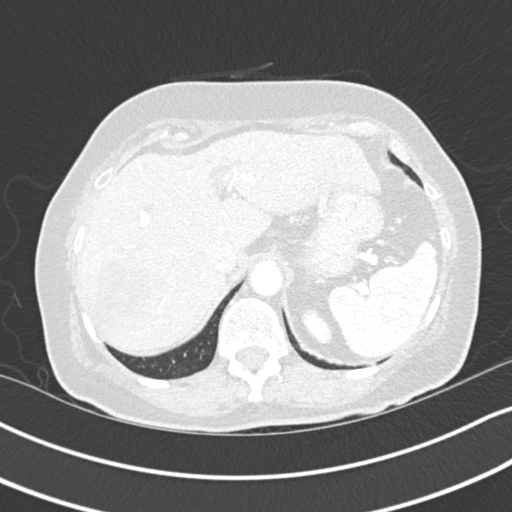
[im 50/173  lung]
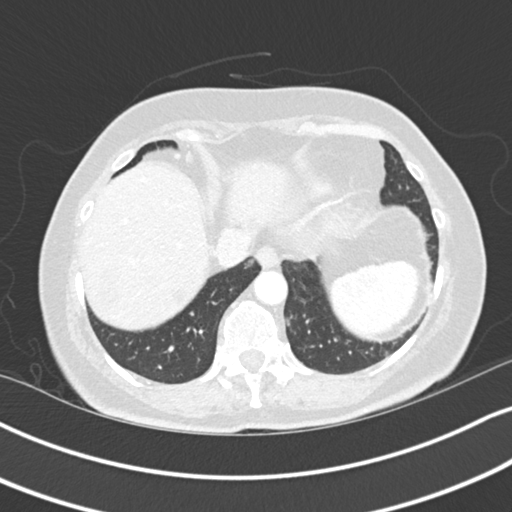
[im 62/173  mediastinal]
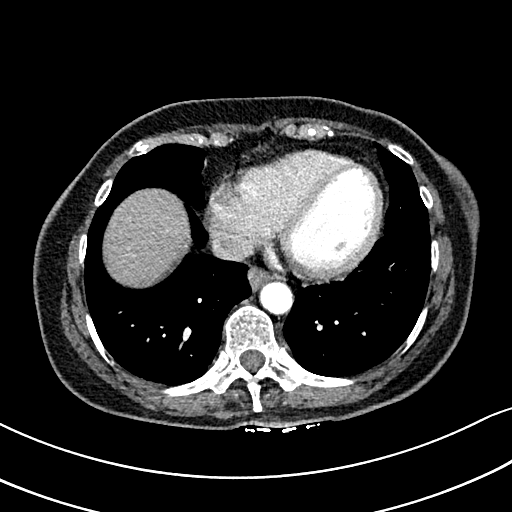
[im 62/173  lung]
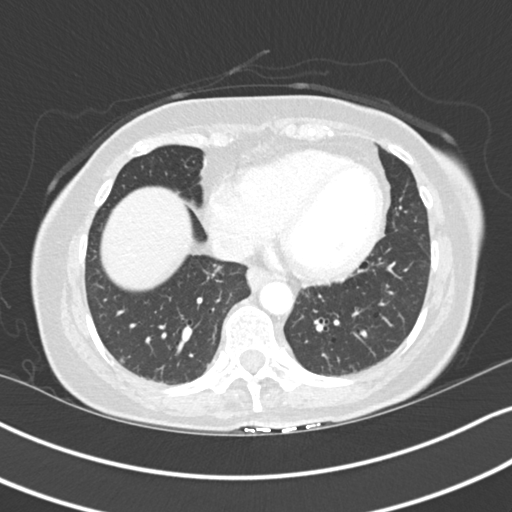
[im 74/173  lung]
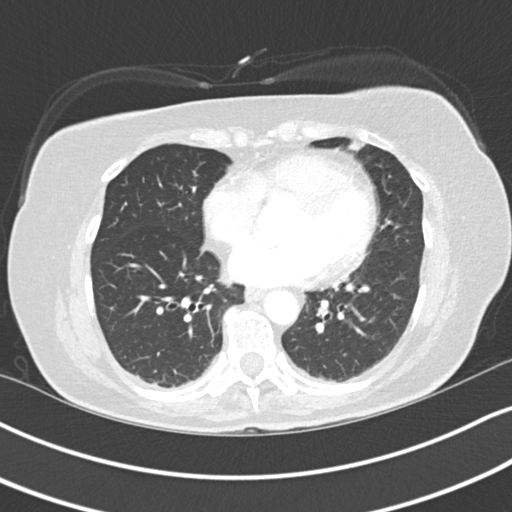
[im 87/173  lung]
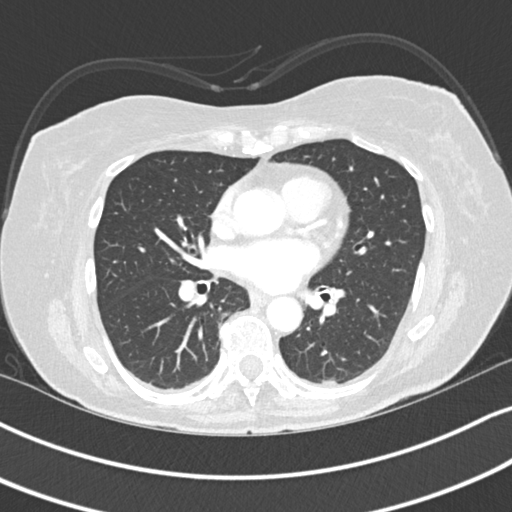
[im 99/173  lung]
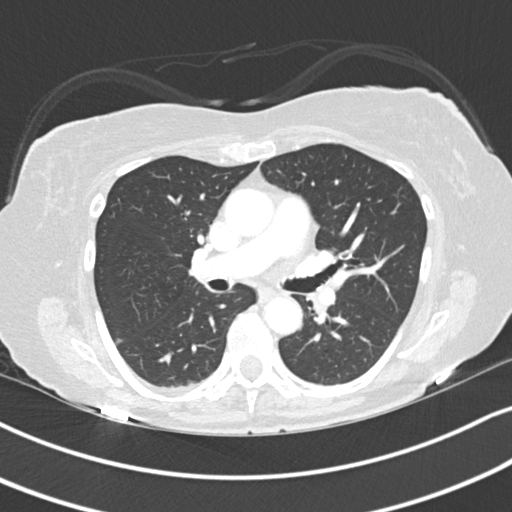
[im 111/173  mediastinal]
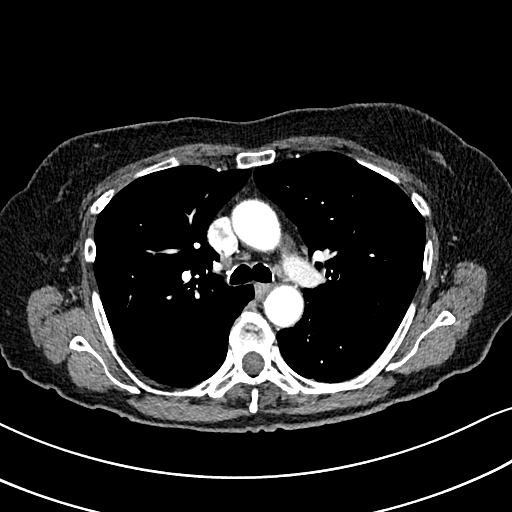
[im 111/173  lung]
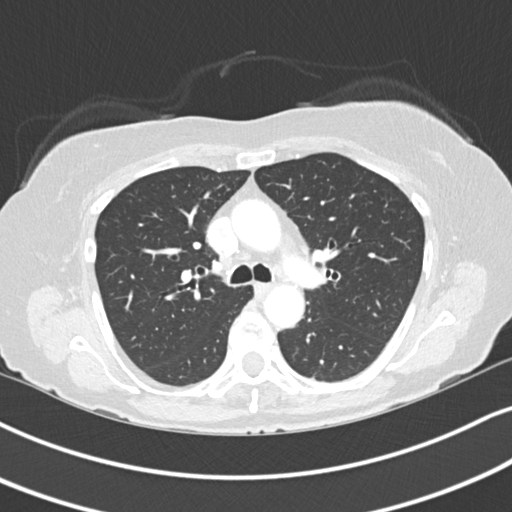
[im 123/173  lung]
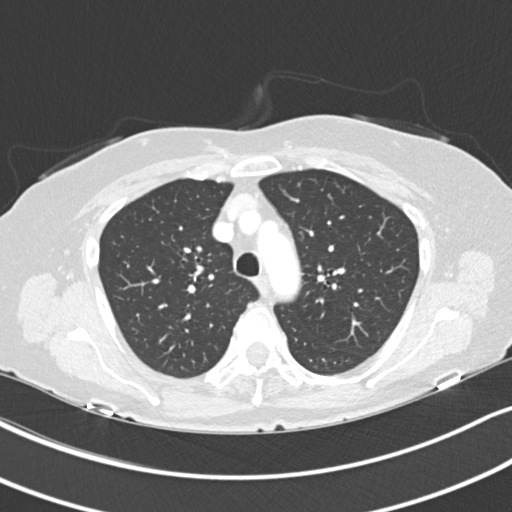
[im 136/173  lung]
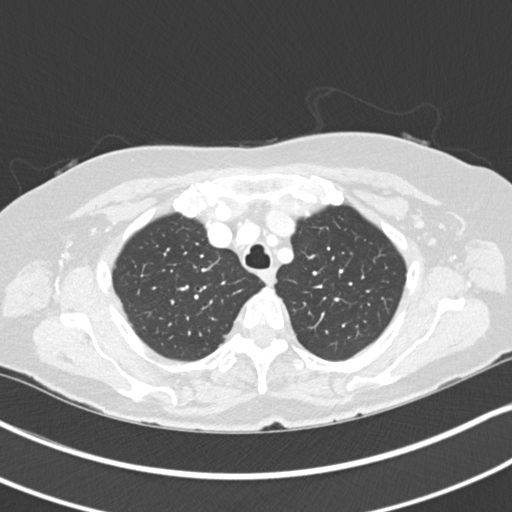
[im 148/173  lung]
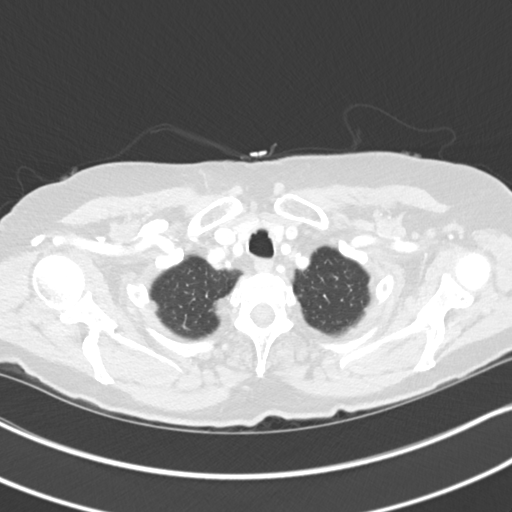
[im 160/173  mediastinal]
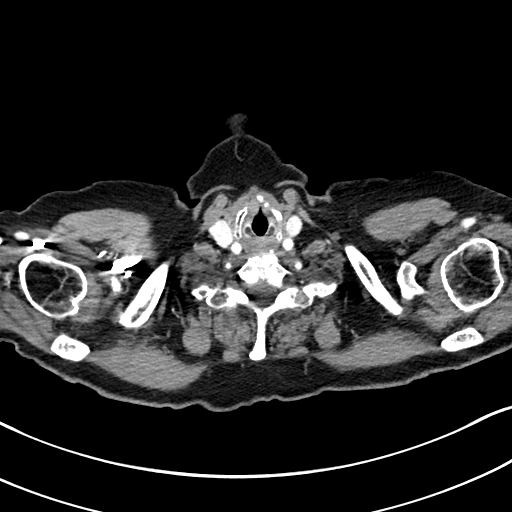
[im 160/173  lung]
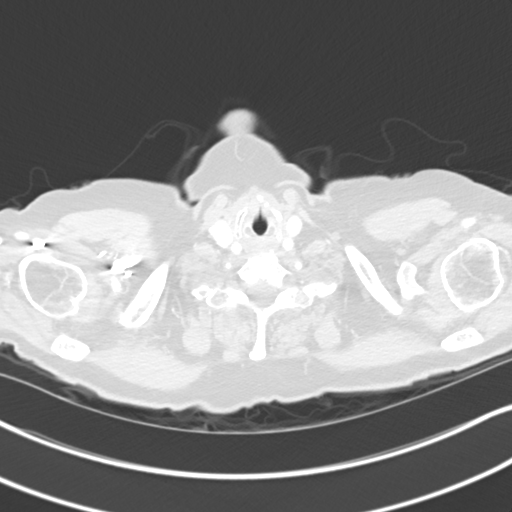

[13 of 31 positions shown; findings below may reference images not displayed]

FINDINGS: Cardiovascular: No significant vascular findings. Normal heart size.
No pericardial effusion.

Mediastinum/Nodes: No enlarged mediastinal, hilar, or axillary lymph
nodes. Thyroid gland, trachea, and esophagus demonstrate no
significant findings.

Lungs/Pleura: Trachea and central bronchi are patent. No
endobronchial lesion. Biapical pleural/parenchymal scarring. 2 mm
nodular density in the anterior aspect of the right upper lobe
(status 7 image 47). 3 mm nodular density in the posterior right
lower lobe (series 7 image 80). There is a 6 mm density in the
medial aspect of the left lower lobe abutting the pleura (series 7
image 122). Another density on the same image measuring 1.0 x
cm. Another density in the posterolateral aspect of the left lower
lobe (image 115) measuring approximately 0.8 x 0.9 cm. And other
nodular density in the left lower lobe on image 92 measuring
approximately 4 mm.

Upper Abdomen: There is a ill-defined hypodense lesion in the right
hepatic lobe on series 2 image 136, measures at least 5.5 x 4.0 cm.
Few other small hypodense lesions in the liver.

Musculoskeletal: No chest wall abnormality. No acute or significant
osseous findings.
IMPRESSION: 1. Multiple small pulmonary densities in the bilateral lower lobes,
of indeterminate etiology, this may represent atelectasis or
metastatic disease. Short-term follow-up examination in 3-6 months
is recommended.

2. Ill-defined hypodense lesion in the liver measuring at least
x 4.0 cm as well as few small hypodense lesions concerning for
malignant process.

3.  No mediastinal lymphadenopathy.

4.  No suspicious osseous lesion.

## 2021-01-11 MED ORDER — IOHEXOL 350 MG/ML SOLN
75.0000 mL | Freq: Once | INTRAVENOUS | Status: AC | PRN
  Administered 2021-01-11: 60 mL via INTRAVENOUS

## 2021-01-11 NOTE — Telephone Encounter (Signed)
I spoke to [name] to review the CT scan and MRI results. MRI of the sacrum revealed a benign process involving the left dorsal root of S2, most consistent with Tarlov cyst. The CT chest showed multiple pulmonary nodules, largest measuring 1 cm, indeterminate etiology. Plan to follow with serial imaging.   Patient inquired about biopsy results from the colonoscopy on 01/06/2021. 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 There was no pathologic confirmation of malignancy. I discussed the need for another biopsy to confirm diagnosis. Discussed case with Dr. Lorenso Morales who recommend a liver biopsy. We will reach out to IR and see if they can arrange a STAT liver biopsy.   We will delay our follow up visit until the biopsy is done. Patient expressed understanding of the plan provided.

## 2021-01-12 ENCOUNTER — Telehealth: Payer: Self-pay | Admitting: Physician Assistant

## 2021-01-12 NOTE — Telephone Encounter (Signed)
Scheduled per 11/17 request, pt has been called and confirmed appt

## 2021-01-13 ENCOUNTER — Other Ambulatory Visit: Payer: Medicare (Managed Care)

## 2021-01-13 ENCOUNTER — Ambulatory Visit: Payer: Medicare (Managed Care) | Admitting: Physician Assistant

## 2021-01-16 ENCOUNTER — Other Ambulatory Visit: Payer: Self-pay | Admitting: Internal Medicine

## 2021-01-16 ENCOUNTER — Encounter: Payer: Medicare (Managed Care) | Admitting: Nutrition

## 2021-01-16 NOTE — H&P (Signed)
Chief Complaint: Patient was seen in consultation today for port a catheter placement and liver mass biopsy at the request of Rockledge T  Referring Physician(s): Lincoln Brigham  Supervising Physician: Daryll Brod  Patient Status: [facility] - Out-pt  History of Present Illness: Norma Morales is a 75 y.o. female with PMH of HLD. 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 Pt referred to IR for port a catheter placement and liver mass biopsy by Dr. Dede Query. Liver mass biopsy was approved by Dr. Vernard Gambles, IR.  Past Medical History:  Diagnosis Date   Hyperlipidemia     Past Surgical History:  Procedure Laterality Date   TONSILLECTOMY      Allergies: Aspirin  Medications: Prior to Admission medications   Medication Sig Start Date End Date Taking? Authorizing Provider  alendronate (FOSAMAX) 70 MG tablet TAKE 1 TABLET BY MOUTH EVERY SEVEN DAYS. 12/28/20   [provider]  ascorbic acid (VITAMIN C) 500 MG tablet Take by mouth.    [provider]  atenolol (TENORMIN) 25 MG tablet Take by mouth. 06/14/10   [provider]  b complex vitamins capsule Take 1 capsule by mouth daily.    [provider]  bisacodyl (DULCOLAX) 5 MG EC tablet Take by mouth. 01/02/21 02/01/21  [provider]  Calcium Carbonate (CALCIUM 600 PO) Take by mouth daily.    [provider]  Cholecalciferol 25 MCG (1000 UT) capsule Take by mouth.    [provider]  Coenzyme Q10 (COQ10 PO) Take 50 mg by mouth daily.    [provider]  ezetimibe (ZETIA) 10 MG tablet Take 1 tablet by mouth daily. 07/07/20   [provider]  Glucosamine-Chondroitin (GLUCOSAMINE CHONDR COMPLEX PO) Take by mouth daily.    [provider]  Omega-3 1000 MG CAPS Take by mouth.    [provider]  polyethylene glycol powder (GLYCOLAX/MIRALAX) 17 GM/SCOOP powder Take by  mouth. 12/19/20 01/18/21  [provider]  Probiotic Product (PROBIOTIC BLEND PO) Take by mouth daily.    [provider]  Resveratrol-Quercetin 100-100 MG TABS Take by mouth.    [provider]  Vitamin E 268 MG (400 UNIT) CAPS Take by mouth daily.    [provider]  Zinc 50 MG TABS Take by mouth daily.    [provider]     No family history on file.  Social History   Socioeconomic History   Marital status: Married    Spouse name: Not on file   Number of children: Not on file   Years of education: Not on file   Highest education level: Not on file  Occupational History   Not on file  Tobacco Use   Smoking status: Never   Smokeless tobacco: Never  Substance and Sexual Activity   Alcohol use: Never   Drug use: Never   Sexual activity: Not on file  Other Topics Concern   Not on file  Social History Narrative   Not on file   Social Determinants of Health   Financial Resource Strain: Not on file  Food Insecurity: Not on file  Transportation Needs: Not on file  Physical Activity: Not on file  Stress: Not on file  Social Connections: Not on file     Review of Systems: A 12 point ROS discussed and pertinent positives are indicated in the HPI above.  All other systems are negative.  Review of Systems  Constitutional:  Negative for chills and fever.  HENT:  Negative for nosebleeds.   Eyes:  Negative for visual disturbance.  Respiratory:  Negative for cough and shortness of breath.   Cardiovascular:  Negative for chest pain and leg swelling.  Gastrointestinal:  Positive for abdominal pain. Negative for blood in stool, nausea and vomiting.  Genitourinary:  Negative for hematuria.  Musculoskeletal:  Positive for back pain.  Neurological:  Negative for dizziness, light-headedness and headaches.   Vital Signs: BP (!) 122/52   Pulse 70   Temp 98 F (36.7 C) (Oral)   Resp 16   SpO2 100%   Physical Exam Constitutional:       Appearance: Normal appearance. She is not ill-appearing.  HENT:     Head: Normocephalic and atraumatic.     Mouth/Throat:     Mouth: Mucous membranes are moist.     Pharynx: Oropharynx is clear.  Eyes:     Pupils: Pupils are equal, round, and reactive to light.  Cardiovascular:     Rate and Rhythm: Normal rate and regular rhythm.     Pulses: Normal pulses.     Heart sounds: Normal heart sounds. No murmur heard.   No friction rub. No gallop.  Pulmonary:     Effort: Pulmonary effort is normal. No respiratory distress.     Breath sounds: Normal breath sounds. No stridor. No wheezing, rhonchi or rales.  Abdominal:     General: Bowel sounds are normal. There is no distension.     Palpations: Abdomen is soft.     Tenderness: There is abdominal tenderness. There is no guarding.  Musculoskeletal:     Right lower leg: No edema.     Left lower leg: No edema.  Skin:    General: Skin is warm and dry.  Neurological:     Mental Status: She is alert and oriented to person, place, and time.  Psychiatric:        Mood and Affect: Mood normal.        Behavior: Behavior normal.        Thought Content: Thought content normal.        Judgment: Judgment normal.    Imaging: CT Chest W Contrast  Result Date: 01/11/2021 CLINICAL DATA:  Staging for metastatic disease in the abdomen/pelvis EXAM: CT CHEST WITH CONTRAST TECHNIQUE: Multidetector CT imaging of the chest was performed during intravenous contrast administration. CONTRAST:  63mL OMNIPAQUE IOHEXOL 350 MG/ML SOLN COMPARISON:  None. FINDINGS: Cardiovascular: No significant vascular findings. Normal heart size. No pericardial effusion. Mediastinum/Nodes: No enlarged mediastinal, hilar, or axillary lymph nodes. Thyroid gland, trachea, and esophagus demonstrate no significant findings. Lungs/Pleura: Trachea and central bronchi are patent. No endobronchial lesion. Biapical pleural/parenchymal scarring. 2 mm nodular density in the anterior aspect of  the right upper lobe (status 7 image 47). 3 mm nodular density in the posterior right lower lobe (series 7 image 80). There is a 6 mm density in the medial aspect of the left lower lobe abutting the pleura (series 7 image 122). Another density on the same image measuring 1.0 x 0.3 cm. Another density in the posterolateral aspect of the left lower lobe (image 115) measuring approximately 0.8 x 0.9 cm. And other nodular density in the left lower lobe on image 92 measuring approximately 4 mm. Upper Abdomen: There is a ill-defined hypodense lesion in the right hepatic lobe on series 2 image 136, measures at least 5.5 x 4.0 cm. Few other small hypodense lesions in the liver. Musculoskeletal: No chest  wall abnormality. No acute or significant osseous findings. IMPRESSION: 1. Multiple small pulmonary densities in the bilateral lower lobes, of indeterminate etiology, this may represent atelectasis or metastatic disease. Short-term follow-up examination in 3-6 months is recommended. 2. Ill-defined hypodense lesion in the liver measuring at least 5.5 x 4.0 cm as well as few small hypodense lesions concerning for malignant process. 3.  No mediastinal lymphadenopathy. 4.  No suspicious osseous lesion. Electronically Signed   By: Keane Police D.O.   On: 01/11/2021 15:01   MR SACRUM SI JOINTS W WO CONTRAST  Result Date: 01/10/2021 CLINICAL DATA:  Suspected bone lesion seen on prior CT examination EXAM: MRI SACRUM WITH AND WITHOUT CONTRAST TECHNIQUE: Multiplanar multi-sequence MR imaging of the sacrum was performed without and with intravenous contrast. COMPARISON:  None. CT examination dated December 29, 2020 FINDINGS: Bones/Joint/Cartilage No fracture or dislocation. Normal alignment. No joint effusion. No marrow signal abnormality. No SI joint widening or erosive changes. No subchondral reactive marrow changes. No SI joint effusion. There is a T1 hypointense, T2 hyperintense nonenhancing structure abutting the left dorsal  root of S2 measuring approximately 1.3 x 1.6 x 1.7 cm, most consistent with a Tarlov cyst, a benign process. No focal lumbar spine abnormality. Degenerate disc disease of the L5-S1. Ligaments, Muscles and Tendons Muscles are normal. No muscle atrophy. No muscle edema. Piriformis muscles are normal bilaterally without signal abnormality. Soft tissue No fluid collection or hematoma. No soft tissue mass. Normal neurovascular bundles. Visualized pelvic viscera are unremarkable. IMPRESSION: 1.  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 2.  No suspicious osseous lesion. Electronically Signed   By: Keane Police D.O.   On: 01/10/2021 17:50    Labs:  CBC: Recent Labs    01/04/21 1424 01/17/21 1140  WBC 11.0* 14.0*  HGB 10.8* 11.1*  HCT 33.3* 34.8*  PLT 378 454*    COAGS: No results for input(s): INR, APTT in the last 8760 hours.  BMP: Recent Labs    01/04/21 1424  NA 138  K 4.0  CL 102  CO2 26  GLUCOSE 91  BUN 6*  CALCIUM 9.3  CREATININE 0.67  GFRNONAA >60    LIVER FUNCTION TESTS: Recent Labs    01/04/21 1424  BILITOT 0.4  AST 74*  ALT 54*  ALKPHOS 228*  PROT 7.2  ALBUMIN 3.4*    TUMOR MARKERS: No results for input(s): AFPTM, CEA, CA199, CHROMGRNA in the last 8760 hours.  Assessment and Plan: History of HLD. 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 Pt referred to IR for port a catheter placement and liver mass biopsy by Dr. Dede Query. Liver mass biopsy was approved by Dr. Vernard Gambles, IR.   Pt resting quietly on stretcher. She is is A&O, calm and pleasant. She is in no distress.  Pt states she is NPO per order.  Today's labs pending.  VSS.   Risks and benefits of image guided port-a-catheter placement was discussed with the patient including, but not limited to bleeding, infection, pneumothorax, or fibrin sheath development and need for additional procedures.  All of the  patient's questions were answered, patient is agreeable to proceed. Consent signed and in chart.   Risks and benefits of liver mass biopsy was discussed with the patient and/or patient's family including, but not limited to bleeding, infection, damage to adjacent structures or low yield requiring additional tests.  All of the questions were answered and there  is agreement to proceed.  Consent signed and in chart.   Thank you for this interesting consult.  I greatly enjoyed meeting Norma Morales and look forward to participating in their care.  A copy of this report was sent to the requesting provider on this date.  Electronically Signed: Tyson Alias, NP 01/17/2021, 11:59 AM   I spent a total of 30 minutes in face to face in clinical consultation, greater than 50% of which was counseling/coordinating care for port a catheter placement and liver mass biopsy.

## 2021-01-17 ENCOUNTER — Ambulatory Visit (HOSPITAL_COMMUNITY)
Admission: RE | Admit: 2021-01-17 | Discharge: 2021-01-17 | Disposition: A | Discharge status: Home / Self Care | Payer: Medicare HMO | Source: Ambulatory Visit | Attending: Physician Assistant | Admitting: Physician Assistant

## 2021-01-17 ENCOUNTER — Other Ambulatory Visit: Payer: Self-pay | Admitting: Physician Assistant

## 2021-01-17 ENCOUNTER — Other Ambulatory Visit: Payer: Self-pay

## 2021-01-17 ENCOUNTER — Encounter (HOSPITAL_COMMUNITY): Payer: Self-pay

## 2021-01-17 DIAGNOSIS — R160 Hepatomegaly, not elsewhere classified: Secondary | ICD-10-CM

## 2021-01-17 DIAGNOSIS — K6389 Other specified diseases of intestine: Secondary | ICD-10-CM

## 2021-01-17 DIAGNOSIS — C189 Malignant neoplasm of colon, unspecified: Secondary | ICD-10-CM | POA: Insufficient documentation

## 2021-01-17 DIAGNOSIS — E785 Hyperlipidemia, unspecified: Secondary | ICD-10-CM | POA: Diagnosis not present

## 2021-01-17 DIAGNOSIS — C787 Secondary malignant neoplasm of liver and intrahepatic bile duct: Secondary | ICD-10-CM | POA: Diagnosis not present

## 2021-01-17 HISTORY — PX: IR IMAGING GUIDED PORT INSERTION: IMG5740

## 2021-01-17 HISTORY — DX: Malignant (primary) neoplasm, unspecified: C80.1

## 2021-01-17 HISTORY — PX: IR US GUIDE BX ASP/DRAIN: IMG2392

## 2021-01-17 LAB — COMPREHENSIVE METABOLIC PANEL
ALT: 187 U/L — ABNORMAL HIGH (ref 0–44)
AST: 201 U/L — ABNORMAL HIGH (ref 15–41)
Albumin: 3.6 g/dL (ref 3.5–5.0)
Alkaline Phosphatase: 404 U/L — ABNORMAL HIGH (ref 38–126)
Anion gap: 11 (ref 5–15)
BUN: 9 mg/dL (ref 8–23)
CO2: 26 mmol/L (ref 22–32)
Calcium: 9.2 mg/dL (ref 8.9–10.3)
Chloride: 100 mmol/L (ref 98–111)
Creatinine, Ser: 0.64 mg/dL (ref 0.44–1.00)
GFR, Estimated: >60 mL/min (ref >60)
Glucose, Bld: 114 mg/dL — ABNORMAL HIGH (ref 70–99)
Potassium: 4.1 mmol/L (ref 3.5–5.1)
Sodium: 137 mmol/L (ref 135–145)
Total Bilirubin: 0.9 mg/dL (ref 0.3–1.2)
Total Protein: 7.7 g/dL (ref 6.5–8.1)

## 2021-01-17 LAB — CBC
HCT: 34.8 % — ABNORMAL LOW (ref 36.0–46.0)
Hemoglobin: 11.1 g/dL — ABNORMAL LOW (ref 12.0–15.0)
MCH: 26.7 pg (ref 26.0–34.0)
MCHC: 31.9 g/dL (ref 30.0–36.0)
MCV: 83.7 fL (ref 80.0–100.0)
Platelets: 454 10*3/uL — ABNORMAL HIGH (ref 150–400)
RBC: 4.16 MIL/uL (ref 3.87–5.11)
RDW: 13.5 % (ref 11.5–15.5)
WBC: 14 10*3/uL — ABNORMAL HIGH (ref 4.0–10.5)
nRBC: 0 % (ref 0.0–0.2)

## 2021-01-17 LAB — PROTIME-INR
INR: 1 (ref 0.8–1.2)
Prothrombin Time: 13.7 seconds (ref 11.4–15.2)

## 2021-01-17 IMAGING — US IR US GUIDANCE
1 series · 8 of 8 positions shown · non-contrast
Comparison: none

INDICATION: HEPATIC METASTATIC DISEASE.

[Series 1: ir us guidance · 8 acquisitions, 8 frames shown]
[im 1/8]
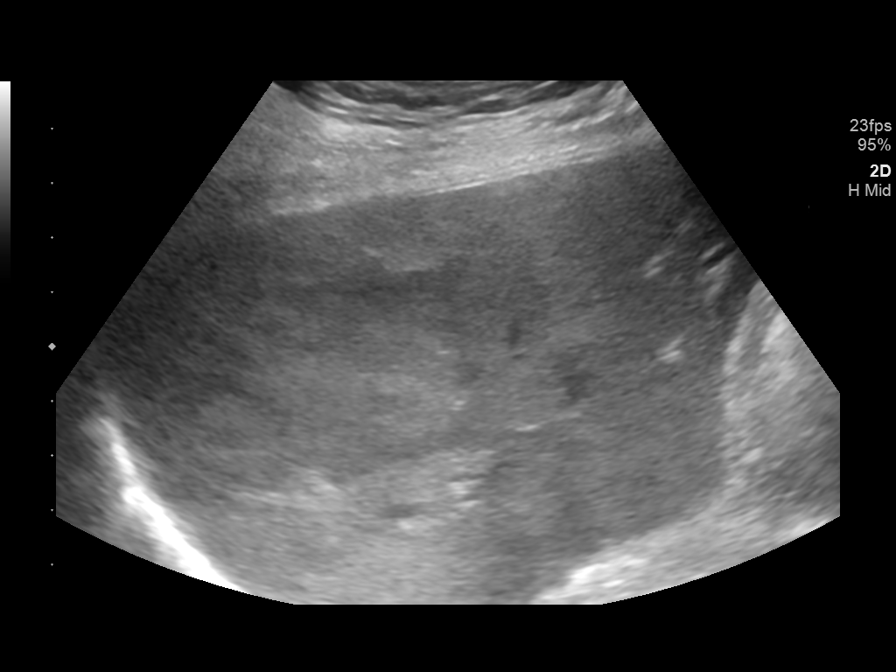
[im 2/8]
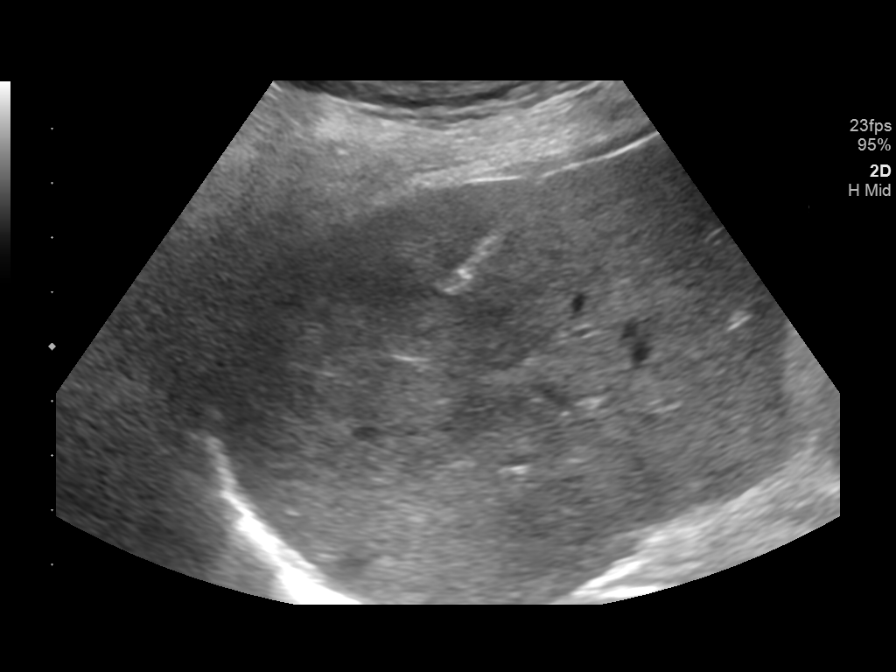
[im 3/8]
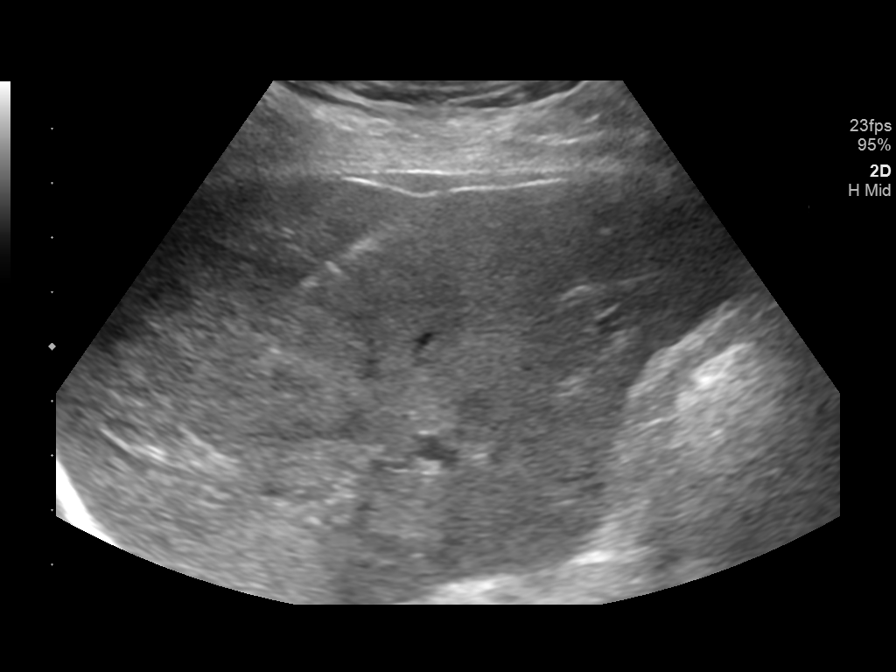
[im 4/8]
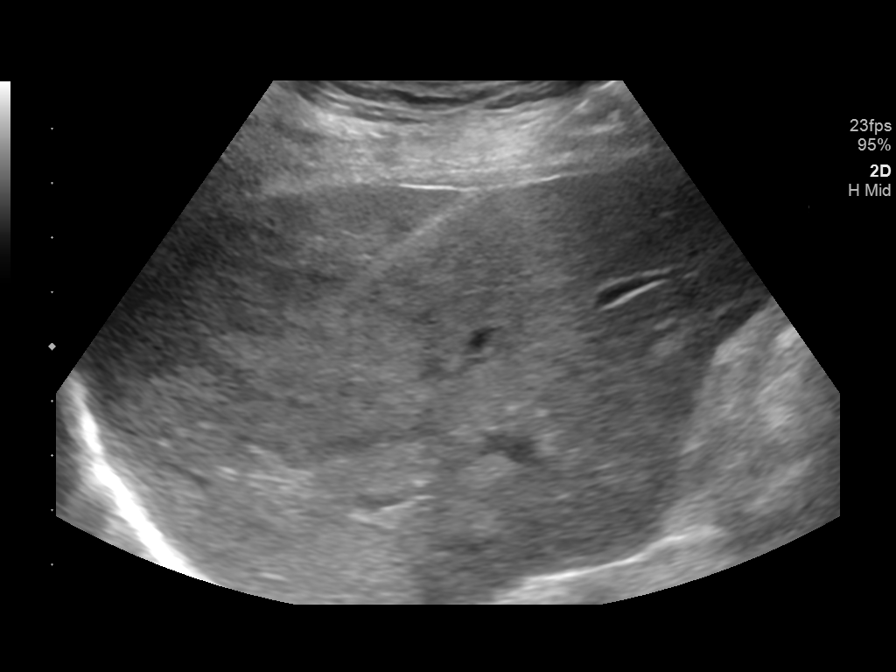
[im 5/8]
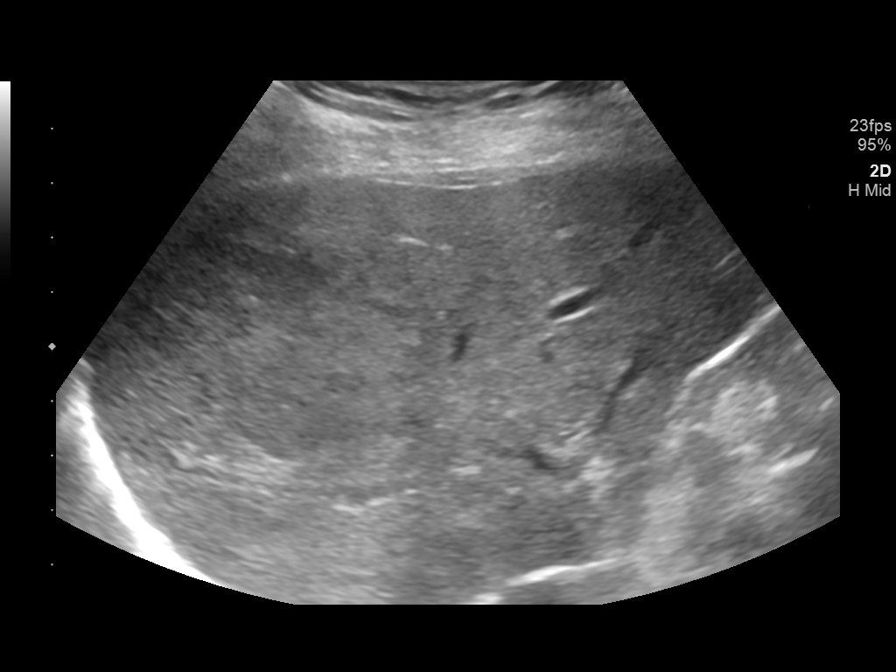
[im 6/8]
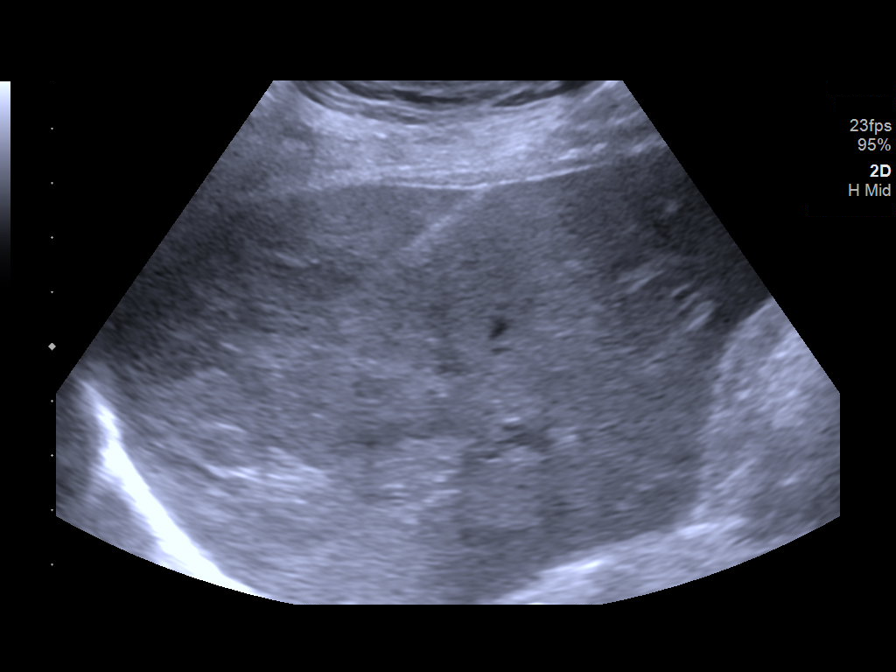
[im 7/8]
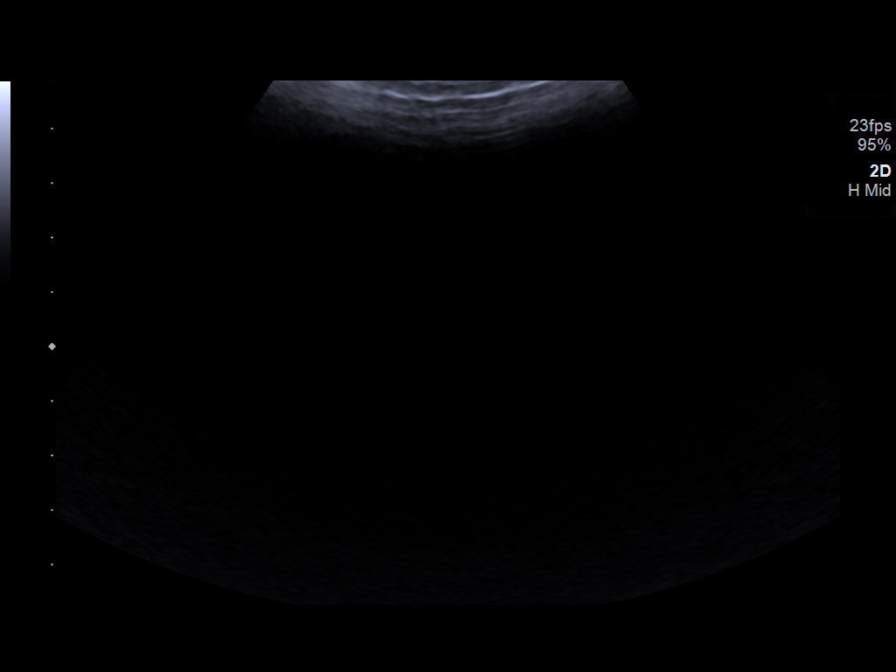
[im 8/8]
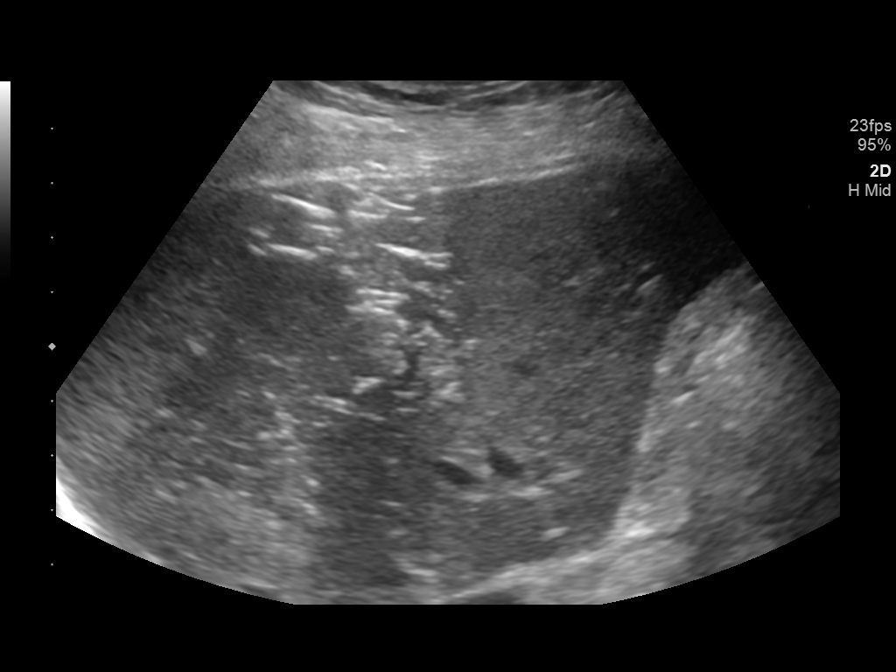

[8 of 8 positions shown; findings below may reference images not displayed]

EXAM:
ULTRASOUND CORE BIOPSY RIGHT HEPATIC MASS

MEDICATIONS:
1% LIDOCAINE

ANESTHESIA/SEDATION:
Moderate (conscious) sedation was employed during this procedure. A
total of Versed 2.0 mg and Fentanyl 100 mcg was administered
intravenously by the radiology nurse.

Total intra-service moderate Sedation Time: 10 minutes. The
patient's level of consciousness and vital signs were monitored
continuously by radiology nursing throughout the procedure under my
direct supervision.

FLUOROSCOPY TIME:  Fluoroscopy Time: None.

COMPLICATIONS:
None immediate.

PROCEDURE:
Informed written consent was obtained from the patient after a
thorough discussion of the procedural risks, benefits and
alternatives. All questions were addressed. Maximal Sterile Barrier
Technique was utilized including caps, mask, sterile gowns, sterile
gloves, sterile drape, hand hygiene and skin antiseptic. A timeout
was performed prior to the initiation of the procedure.

Previous imaging reviewed. Preliminary ultrasound performed. Right
hepatic lesion was localized and marked for a mid axillary line
approach through a lower intercostal space.

Under sterile conditions and local anesthesia, a 17 gauge 11.8 cm
guide was advanced to the lesion. Needle position confirmed with
ultrasound. Images obtained for documentation. 18 gauge core
biopsies obtained under direct ultrasound. Samples were intact and
non fragmented. These were placed in formalin. Needle tract occluded
with Gel-Foam. Postprocedure imaging demonstrates no hemorrhage or
hematoma. Patient tolerated biopsy well.
IMPRESSION: Successful ultrasound right hepatic mass 18 gauge core biopsy

## 2021-01-17 IMAGING — US IR IMAGING GUIDED PORT INSERTION
1 series · 1 of 1 positions shown · non-contrast
Comparison: none

CLINICAL DATA: Imaging findings consistent with metastatic colon
cancer to liver

[Series 1: ir fluoro/shunt/fist · 1 of 1 slices shown]
[im 1/1]
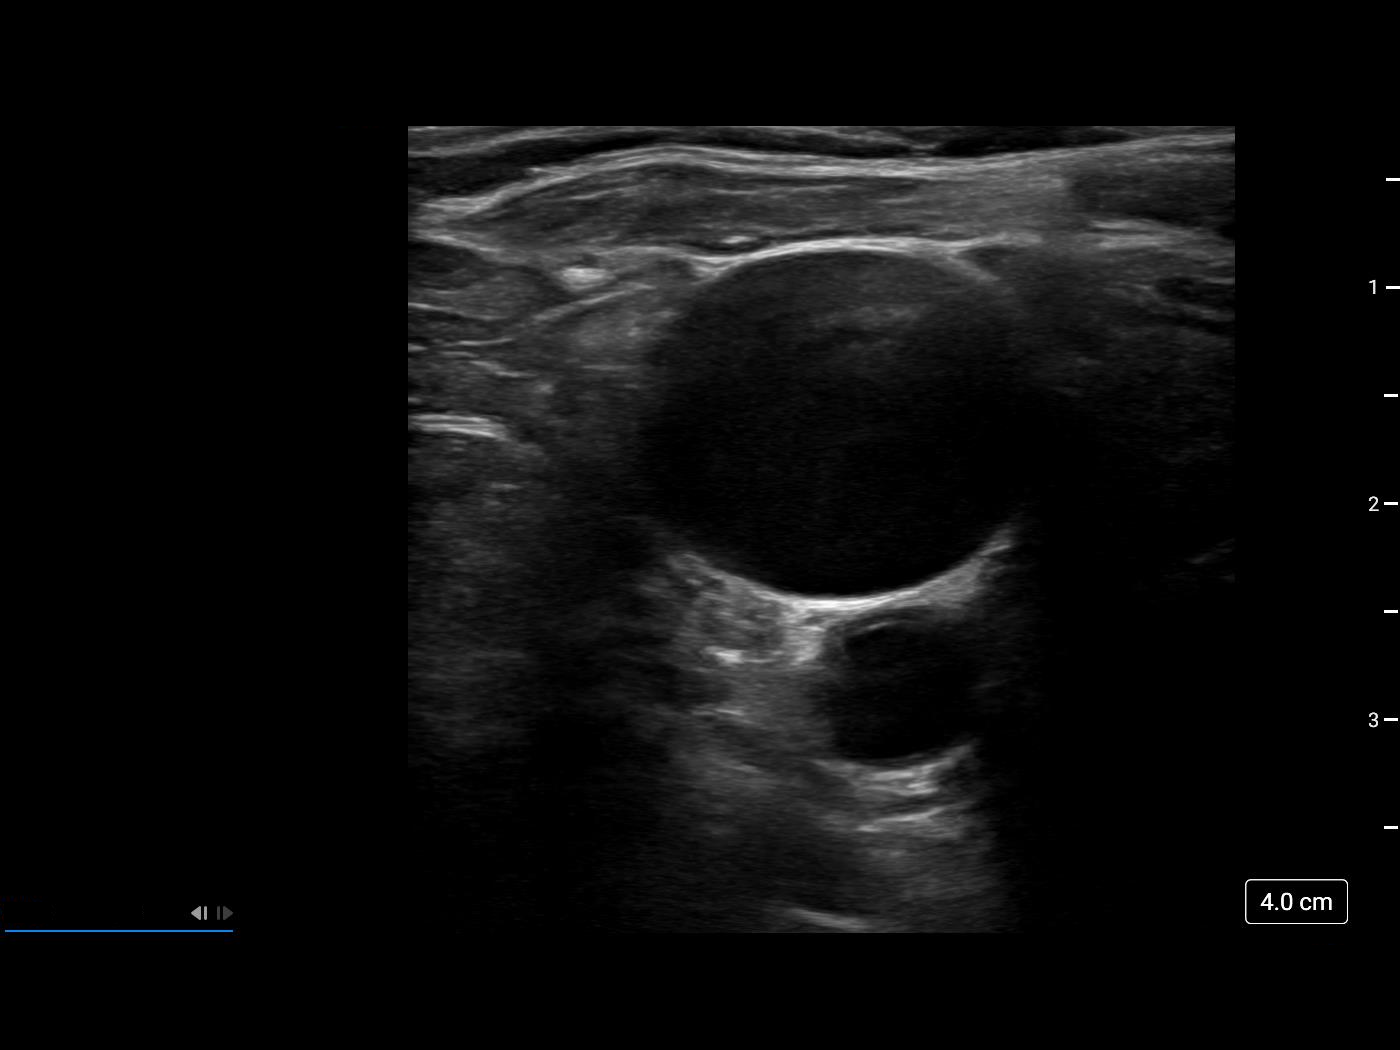

[1 of 1 positions shown; findings below may reference images not displayed]

EXAM:
RIGHT INTERNAL JUGULAR SINGLE LUMEN POWER PORT CATHETER INSERTION

Date:  [DATE] [DATE] [DATE]

Radiologist:  LENTZ

Guidance:  Ultrasound and fluoroscopic

MEDICATIONS:
1% lidocaine local with epinephrine

ANESTHESIA/SEDATION:
Versed 2.0 mg IV; Fentanyl 100 mcg IV;

Moderate Sedation Time:  23 minute

The patient was continuously monitored during the procedure by the
interventional radiology nurse under my direct supervision.

FLUOROSCOPY TIME:  0 minutes, 36 seconds (1 mGy)

COMPLICATIONS:
None immediate.

CONTRAST:  None.

PROCEDURE:
Informed consent was obtained from the patient following explanation
of the procedure, risks, benefits and alternatives. The patient
understands, agrees and consents for the procedure. All questions
were addressed. A time out was performed.

Maximal barrier sterile technique utilized including caps, mask,
sterile gowns, sterile gloves, large sterile drape, hand hygiene,
and 2% chlorhexidine scrub.

Under sterile conditions and local anesthesia, right internal
jugular micropuncture venous access was performed. Access was
performed with ultrasound. Images were obtained for documentation of
the patent right internal jugular vein. A guide wire was inserted
followed by a transitional dilator. This allowed insertion of a
guide wire and catheter into the IVC. Measurements were obtained
from the SVC / RA junction back to the right IJ venotomy site. In
the right infraclavicular chest, a subcutaneous pocket was created
over the second anterior rib. This was done under sterile conditions
and local anesthesia. 1% lidocaine with epinephrine was utilized for
this. A 2.5 cm incision was made in the skin. Blunt dissection was
performed to create a subcutaneous pocket over the right pectoralis
major muscle. The pocket was flushed with saline vigorously. There
was adequate hemostasis. The port catheter was assembled and checked
for leakage. The port catheter was secured in the pocket with two
retention sutures. The tubing was tunneled subcutaneously to the
right venotomy site and inserted into the SVC/RA junction through a
valved peel-away sheath. Position was confirmed with fluoroscopy.
Images were obtained for documentation. The patient tolerated the
procedure well. No immediate complications. Incisions were closed in
a two layer fashion with 4 - 0 Vicryl suture. Dermabond was applied
to the skin. The port catheter was accessed, blood was aspirated
followed by saline and heparin flushes. Needle was removed. A dry
sterile dressing was applied.
IMPRESSION: Ultrasound and fluoroscopically guided right internal jugular single
lumen power port catheter insertion. Tip in the SVC/RA junction.
Catheter ready for use.

## 2021-01-17 MED ORDER — SODIUM CHLORIDE 0.9 % IV SOLN: INTRAVENOUS | Status: DC

## 2021-01-17 MED ORDER — MIDAZOLAM HCL 2 MG/2ML IJ SOLN
INTRAMUSCULAR | Status: AC | PRN
  Administered 2021-01-17 (×4): 1 mg via INTRAVENOUS

## 2021-01-17 MED ORDER — HEPARIN SOD (PORK) LOCK FLUSH 100 UNIT/ML IV SOLN
INTRAVENOUS | Status: AC
  Filled 2021-01-17: qty 5

## 2021-01-17 MED ORDER — GELATIN ABSORBABLE 12-7 MM EX MISC
CUTANEOUS | Status: AC | PRN
  Administered 2021-01-17: 1 via TOPICAL

## 2021-01-17 MED ORDER — LIDOCAINE-EPINEPHRINE 2 %-1:100000 IJ SOLN
INTRAMUSCULAR | Status: AC | PRN
  Administered 2021-01-17: 20 mL
  Administered 2021-01-17: 10 mL

## 2021-01-17 MED ORDER — GELATIN ABSORBABLE 12-7 MM EX MISC
CUTANEOUS | Status: AC
  Filled 2021-01-17: qty 1

## 2021-01-17 MED ORDER — LIDOCAINE-EPINEPHRINE (PF) 2 %-1:200000 IJ SOLN
INTRAMUSCULAR | Status: AC
  Filled 2021-01-17: qty 40

## 2021-01-17 MED ORDER — FENTANYL CITRATE (PF) 100 MCG/2ML IJ SOLN
INTRAMUSCULAR | Status: AC | PRN
  Administered 2021-01-17 (×2): 50 ug via INTRAVENOUS

## 2021-01-17 MED ORDER — MIDAZOLAM HCL 2 MG/2ML IJ SOLN
INTRAMUSCULAR | Status: AC
  Filled 2021-01-17: qty 4

## 2021-01-17 MED ORDER — FENTANYL CITRATE (PF) 100 MCG/2ML IJ SOLN
INTRAMUSCULAR | Status: AC
  Filled 2021-01-17: qty 2

## 2021-01-17 MED ORDER — HEPARIN SOD (PORK) LOCK FLUSH 100 UNIT/ML IV SOLN
INTRAVENOUS | Status: AC | PRN
  Administered 2021-01-17: 500 [IU] via INTRAVENOUS

## 2021-01-17 NOTE — Discharge Instructions (Signed)
Interventional radiology phone numbers [phone] After hours [phone]    You have skin glue (dermabond) over your new port. Do not use the lidocaine cream (EMLA cream) over the skin glue until it has healed. The petroleum in the lidocaine cream will dissolve the skin glue resulting in an infection of your new port. Use ice in a zip lock bag for 1-2 minutes over your new port before the cancer center nurses access your port.   Implanted Port Insertion, Care After This sheet gives you information about how to care for yourself after your procedure. Your health care provider may also give you more specific instructions. If you have problems or questions, contact your health care provider. What can I expect after the procedure? After the procedure, it is common to have: Discomfort at the port insertion site. Bruising on the skin over the port. This should improve over 3-4 days. Follow these instructions at home: Port care After your port is placed, you will get a manufacturer's information card. The card has information about your port. Keep this card with you at all times. Take care of the port as told by your health care provider. Ask your health care provider if you or a family member can get training for taking care of the port at home. A home health care nurse may also take care of the port. Make sure to remember what type of port you have. Incision care Follow instructions from your health care provider about how to take care of your port insertion site. Make sure you: Wash your hands with soap and water before and after you change your bandage (dressing). If soap and water are not available, use hand sanitizer. Change your dressing as told by your health care provider. Leave skin glue in place. These skin closures may need to stay in place for 2 weeks or longer.  Check your port insertion site every day for signs of infection. Check for: Redness, swelling, or pain. Fluid or  blood. Warmth. Pus or a bad smell.      Activity Return to your normal activities as told by your health care provider. Ask your health care provider what activities are safe for you. Do not lift anything that is heavier than 10 lb (4.5 kg), or the limit that you are told, until your health care provider says that it is safe. General instructions Take over-the-counter and prescription medicines only as told by your health care provider. Do not take baths, swim, or use a hot tub until your health care provider approves.You may remove your dressing tomorrow and shower 24 hours after your procedure. Do not drive for 24 hours if you were given a sedative during your procedure. Wear a medical alert bracelet in case of an emergency. This will tell any health care providers that you have a port. Keep all follow-up visits as told by your health care provider. This is important. Contact a health care provider if: You cannot flush your port with saline as directed, or you cannot draw blood from the port. You have a fever or chills. You have redness, swelling, or pain around your port insertion site. You have fluid or blood coming from your port insertion site. Your port insertion site feels warm to the touch. You have pus or a bad smell coming from the port insertion site. Get help right away if: You have chest pain or shortness of breath. You have bleeding from your port that you cannot control. Summary Take care of   the port as told by your health care provider. Keep the manufacturer's information card with you at all times. Change your dressing as told by your health care provider. Contact a health care provider if you have a fever or chills or if you have redness, swelling, or pain around your port insertion site. Keep all follow-up visits as told by your health care provider. This information is not intended to replace advice given to you by your health care provider. Make sure you discuss any  questions you have with your health care provider. Document Revised: 09/10/2017 Document Reviewed: 09/10/2017 Elsevier Patient Education  2021 [employer].   Liver Biopsy, Care After These instructions give you information on caring for yourself after your procedure. Your doctor may also give you more specific instructions. Call your doctor if you have any problems or questions after your procedure. What can I expect after the procedure? After the procedure, it is common to have: Pain and soreness where the biopsy was done. Bruising around the area where the biopsy was done. Sleepiness and be tired for a few days. Follow these instructions at home: Medicines Take over-the-counter and prescription medicines only as told by your doctor. If you were prescribed an antibiotic medicine, take it as told by your doctor. Do not stop taking the antibiotic even if you start to feel better. Do not take medicines such as aspirin and ibuprofen. These medicines can thin your blood. Do not take these medicines unless your doctor tells you to take them. If you are taking prescription pain medicine, take actions to prevent or treat constipation. Your doctor may recommend that you: Drink enough fluid to keep your pee (urine) clear or pale yellow. Take over-the-counter or prescription medicines. Eat foods that are high in fiber, such as fresh fruits and vegetables, whole grains, and beans. Limit foods that are high in fat and processed sugars, such as fried and sweet foods. Caring for your cut Follow instructions from your doctor about how to take care of your cuts from surgery (incisions). Make sure you: Wash your hands with soap and water before you change your bandage (dressing). If you cannot use soap and water, use hand sanitizer. Change your bandage as told by your doctor. Check your cuts every day for signs of infection. Check for: Redness, swelling, or more pain. Fluid or blood. Pus or a bad  smell. Warmth. Do not take baths, swim, or use a hot tub until your doctor says it is okay to do so. You may remove your dressing tomorrow and shower. Activity Rest at home for 1-2 days or as told by your doctor. Avoid sitting for a long time without moving. Get up to take short walks every 1-2 hours. Return to your normal activities as told by your doctor. Ask what activities are safe for you. Do not do these things in the first 24 hours: Drive. Use machinery. Take a bath or shower. Do not lift more than 10 pounds (4.5 kg) or play contact sports for the first 2 weeks.   General instructions Do not drink alcohol in the first week after the procedure. Have someone stay with you for at least 24 hours after the procedure. Get your test results. Ask your doctor or the department that is doing the test: When will my results be ready? How will I get my results? What are my treatment options? What other tests do I need? What are my next steps? Keep all follow-up visits as told by your  doctor. This is important.   Contact a doctor if: A cut bleeds and leaves more than just a small spot of blood. A cut is red, puffs up (swells), or hurts more than before. Fluid or something else comes from a cut. A cut smells bad. You have a fever or chills. Get help right away if: You have swelling, bloating, or pain in your belly (abdomen). You get dizzy or faint. You have a rash. You feel sick to your stomach (nauseous) or throw up (vomit). You have trouble breathing, feel short of breath, or feel faint. Your chest hurts. You have problems talking or seeing. You have trouble with your balance or moving your arms or legs. Summary After the procedure, it is common to have pain, soreness, bruising, and tiredness. Your doctor will tell you how to take care of yourself at home. Change your bandage, take your medicines, and limit your activities as told by your doctor. Call your doctor if you have  symptoms of infection. Get help right away if your belly swells, your cut bleeds a lot, or you have trouble talking or breathing. This information is not intended to replace advice given to you by your health care provider. Make sure you discuss any questions you have with your health care provider. Document Revised: 02/21/2017 Document Reviewed: 02/22/2017 Elsevier Patient Education  2021 [place].     Moderate Conscious Sedation, Adult, Care After This sheet gives you information about how to care for yourself after your procedure. Your health care provider may also give you more specific instructions. If you have problems or questions, contact your health care provider. What can I expect after the procedure? After the procedure, it is common to have: Sleepiness for several hours. Impaired judgment for several hours. Difficulty with balance. Vomiting if you eat too soon. Follow these instructions at home: For the time period you were told by your health care provider: Rest. Do not participate in activities where you could fall or become injured. Do not drive or use machinery. Do not drink alcohol. Do not take sleeping pills or medicines that cause drowsiness. Do not make important decisions or sign legal documents. Do not take care of children on your own.     Eating and drinking Follow the diet recommended by your health care provider. Drink enough fluid to keep your urine pale yellow. If you vomit: Drink water, juice, or soup when you can drink without vomiting. Make sure you have little or no nausea before eating solid foods.   General instructions Take over-the-counter and prescription medicines only as told by your health care provider. Have a responsible adult stay with you for the time you are told. It is important to have someone help care for you until you are awake and alert. Do not smoke. Keep all follow-up visits as told by your health care provider. This is  important. Contact a health care provider if: You are still sleepy or having trouble with balance after 24 hours. You feel light-headed. You keep feeling nauseous or you keep vomiting. You develop a rash. You have a fever. You have redness or swelling around the IV site. Get help right away if: You have trouble breathing. You have new-onset confusion at home. Summary After the procedure, it is common to feel sleepy, have impaired judgment, or feel nauseous if you eat too soon. Rest after you get home. Know the things you should not do after the procedure. Follow the diet recommended by your health  care provider and drink enough fluid to keep your urine pale yellow. Get help right away if you have trouble breathing or new-onset confusion at home. This information is not intended to replace advice given to you by your health care provider. Make sure you discuss any questions you have with your health care provider. Document Revised: 06/12/2019 Document Reviewed: 01/08/2019 Elsevier Patient Education  2021 [employer].

## 2021-01-17 NOTE — Procedures (Signed)
Interventional Radiology Procedure Note  Procedure: [place] BX RT LIVER MET  RT IJ POWER PORT    Complications: None  Estimated Blood Loss:  MIN  Findings: 18 G CORES X 3  TIP SVCRA     Tamera Punt, MD

## 2021-01-17 NOTE — Sedation Documentation (Signed)
Begin port placement

## 2021-01-18 ENCOUNTER — Inpatient Hospital Stay (HOSPITAL_BASED_OUTPATIENT_CLINIC_OR_DEPARTMENT_OTHER): Payer: Medicare HMO | Admitting: Physician Assistant

## 2021-01-18 ENCOUNTER — Observation Stay (HOSPITAL_COMMUNITY)
Admission: EM | Admit: 2021-01-18 | Discharge: 2021-01-20 | Disposition: A | Discharge status: Home / Self Care | Payer: Medicare HMO | Attending: Emergency Medicine | Admitting: Emergency Medicine

## 2021-01-18 ENCOUNTER — Emergency Department (HOSPITAL_COMMUNITY): Payer: Medicare HMO

## 2021-01-18 ENCOUNTER — Other Ambulatory Visit: Payer: Self-pay | Admitting: Hematology and Oncology

## 2021-01-18 ENCOUNTER — Other Ambulatory Visit: Payer: Self-pay | Admitting: *Deleted

## 2021-01-18 ENCOUNTER — Inpatient Hospital Stay: Payer: Medicare HMO

## 2021-01-18 ENCOUNTER — Telehealth: Payer: Self-pay | Admitting: *Deleted

## 2021-01-18 ENCOUNTER — Encounter (HOSPITAL_COMMUNITY): Payer: Self-pay

## 2021-01-18 VITALS — BP 126/55 | HR 83 | Temp 98.2°F | Wt 146.6 lb

## 2021-01-18 DIAGNOSIS — R1011 Right upper quadrant pain: Secondary | ICD-10-CM | POA: Diagnosis present

## 2021-01-18 DIAGNOSIS — C7989 Secondary malignant neoplasm of other specified sites: Secondary | ICD-10-CM

## 2021-01-18 DIAGNOSIS — Z20822 Contact with and (suspected) exposure to covid-19: Secondary | ICD-10-CM | POA: Diagnosis not present

## 2021-01-18 DIAGNOSIS — R109 Unspecified abdominal pain: Secondary | ICD-10-CM | POA: Diagnosis not present

## 2021-01-18 DIAGNOSIS — Z79899 Other long term (current) drug therapy: Secondary | ICD-10-CM | POA: Diagnosis not present

## 2021-01-18 DIAGNOSIS — Z85038 Personal history of other malignant neoplasm of large intestine: Secondary | ICD-10-CM | POA: Diagnosis not present

## 2021-01-18 DIAGNOSIS — K6389 Other specified diseases of intestine: Secondary | ICD-10-CM

## 2021-01-18 DIAGNOSIS — K838 Other specified diseases of biliary tract: Secondary | ICD-10-CM

## 2021-01-18 LAB — URINALYSIS, ROUTINE W REFLEX MICROSCOPIC
Bacteria, UA: NONE SEEN
Bilirubin Urine: NEGATIVE
Glucose, UA: NEGATIVE mg/dL
Hgb urine dipstick: NEGATIVE
Ketones, ur: 20 mg/dL — AB
Nitrite: NEGATIVE
Protein, ur: NEGATIVE mg/dL
Specific Gravity, Urine: 1.021 (ref 1.005–1.030)
pH: 5 (ref 5.0–8.0)

## 2021-01-18 LAB — CBC WITH DIFFERENTIAL (CANCER CENTER ONLY)
Abs Immature Granulocytes: 0.05 10*3/uL (ref 0.00–0.07)
Basophils Absolute: 0.1 10*3/uL (ref 0.0–0.1)
Basophils Relative: 0 %
Eosinophils Absolute: 0 10*3/uL (ref 0.0–0.5)
Eosinophils Relative: 0 %
HCT: 32.7 % — ABNORMAL LOW (ref 36.0–46.0)
Hemoglobin: 10.5 g/dL — ABNORMAL LOW (ref 12.0–15.0)
Immature Granulocytes: 0 %
Lymphocytes Relative: 7 %
Lymphs Abs: 1 10*3/uL (ref 0.7–4.0)
MCH: 26.2 pg (ref 26.0–34.0)
MCHC: 32.1 g/dL (ref 30.0–36.0)
MCV: 81.5 fL (ref 80.0–100.0)
Monocytes Absolute: 1.4 10*3/uL — ABNORMAL HIGH (ref 0.1–1.0)
Monocytes Relative: 11 %
Neutro Abs: 10.6 10*3/uL — ABNORMAL HIGH (ref 1.7–7.7)
Neutrophils Relative %: 82 %
Platelet Count: 431 10*3/uL — ABNORMAL HIGH (ref 150–400)
RBC: 4.01 MIL/uL (ref 3.87–5.11)
RDW: 13.5 % (ref 11.5–15.5)
WBC Count: 13 10*3/uL — ABNORMAL HIGH (ref 4.0–10.5)
nRBC: 0 % (ref 0.0–0.2)

## 2021-01-18 LAB — SAMPLE TO BLOOD BANK

## 2021-01-18 LAB — RESP PANEL BY RT-PCR (FLU A&B, COVID) ARPGX2
Influenza A by PCR: NEGATIVE
Influenza B by PCR: NEGATIVE
SARS Coronavirus 2 by RT PCR: NEGATIVE

## 2021-01-18 LAB — LIPASE, BLOOD: Lipase: 31 U/L (ref 11–51)

## 2021-01-18 LAB — LACTIC ACID, PLASMA
Lactic Acid, Venous: 1.5 mmol/L (ref 0.5–1.9)
Lactic Acid, Venous: 1.5 mmol/L (ref 0.5–1.9)

## 2021-01-18 LAB — CMP (CANCER CENTER ONLY)
ALT: 234 U/L — ABNORMAL HIGH (ref 0–44)
AST: 246 U/L (ref 15–41)
Albumin: 3 g/dL — ABNORMAL LOW (ref 3.5–5.0)
Alkaline Phosphatase: 443 U/L — ABNORMAL HIGH (ref 38–126)
Anion gap: 9 (ref 5–15)
BUN: 10 mg/dL (ref 8–23)
CO2: 25 mmol/L (ref 22–32)
Calcium: 9 mg/dL (ref 8.9–10.3)
Chloride: 98 mmol/L (ref 98–111)
Creatinine: 0.66 mg/dL (ref 0.44–1.00)
GFR, Estimated: >60 mL/min (ref >60)
Glucose, Bld: 119 mg/dL — ABNORMAL HIGH (ref 70–99)
Potassium: 4 mmol/L (ref 3.5–5.1)
Sodium: 132 mmol/L — ABNORMAL LOW (ref 135–145)
Total Bilirubin: 0.8 mg/dL (ref 0.3–1.2)
Total Protein: 7.1 g/dL (ref 6.5–8.1)

## 2021-01-18 IMAGING — CT CT ABD-PELV W/ CM
2 of 5 series · 15 of 46 positions shown, 17 images · IV contrast (OMNIPAQUE 350)
Comparison: None.

CLINICAL DATA: Concern for abdominal abscess or infection. History
of metastatic colon cancer. Status post core biopsy of right hepatic
mass.

EXAM:
CT ABDOMEN AND PELVIS WITH CONTRAST
TECHNIQUE: Multidetector CT imaging of the abdomen and pelvis was performed
using the standard protocol following bolus administration of
intravenous contrast.
CONTRAST:  80mL OMNIPAQUE IOHEXOL 350 MG/ML SOLN

[Series 2: axial st · axial · 0.83mm/px · z∈[-434,-4]mm · 12 of 100 slices shown, 14 images]
[im 7/100  soft-tissue]
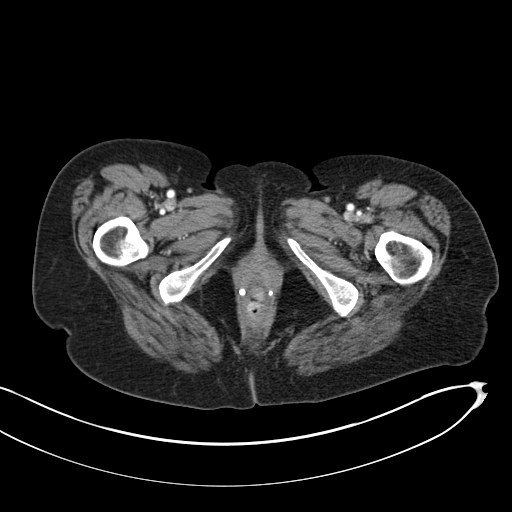
[im 7/100  bone]
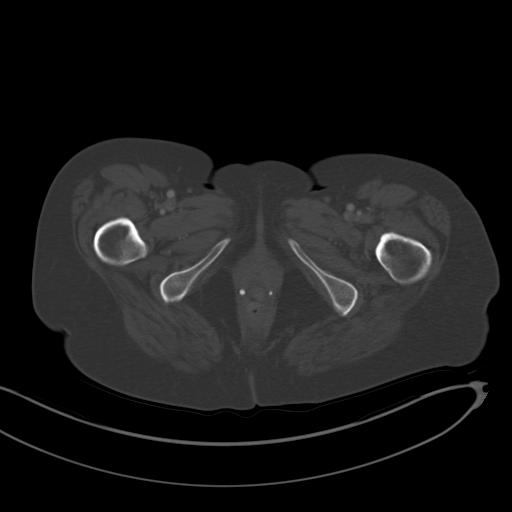
[im 14/100  soft-tissue]
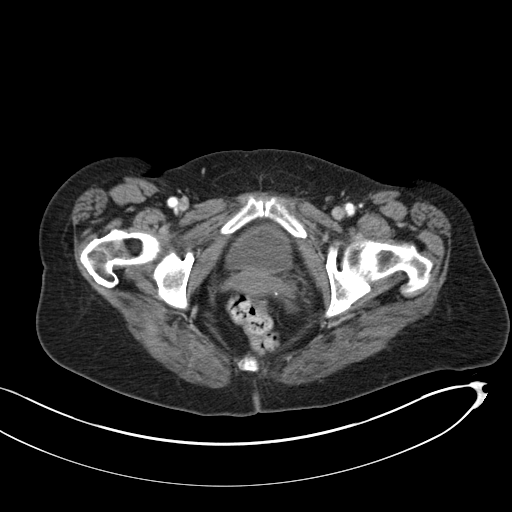
[im 20/100  soft-tissue]
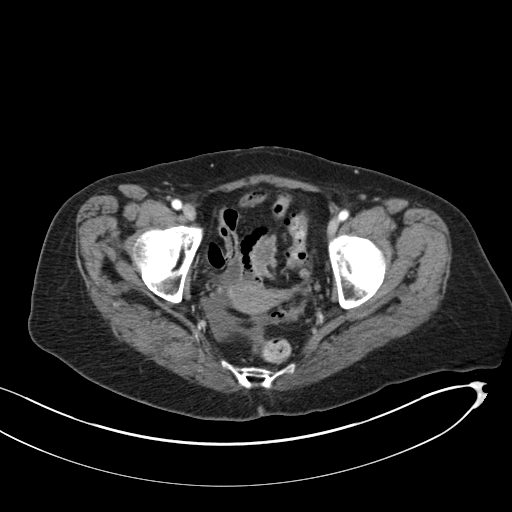
[im 34/100  soft-tissue]
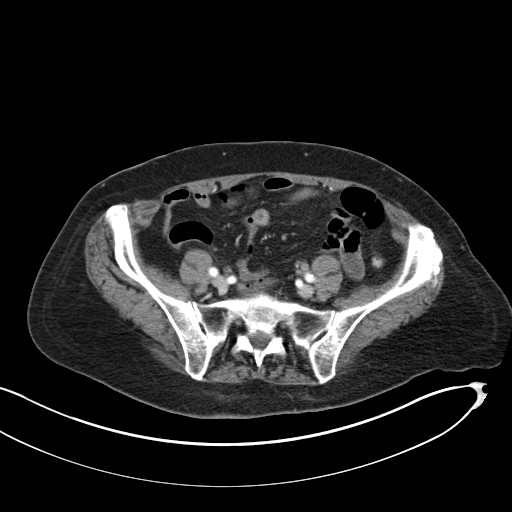
[im 40/100  soft-tissue]
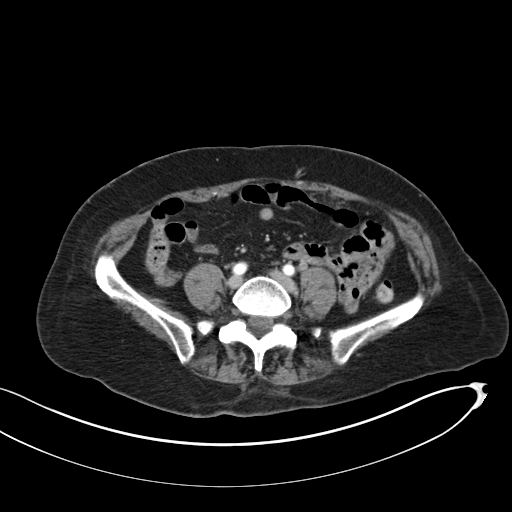
[im 47/100  soft-tissue]
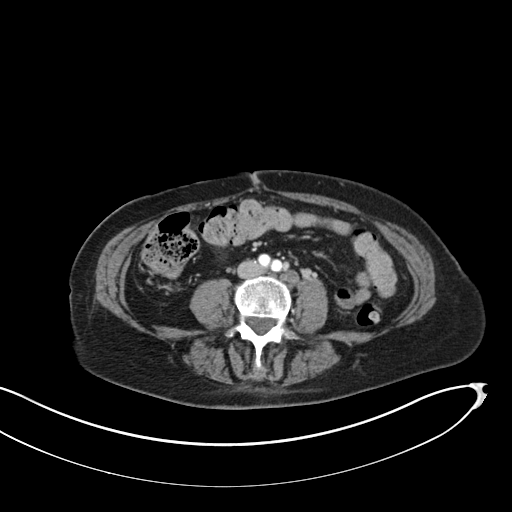
[im 53/100  soft-tissue]
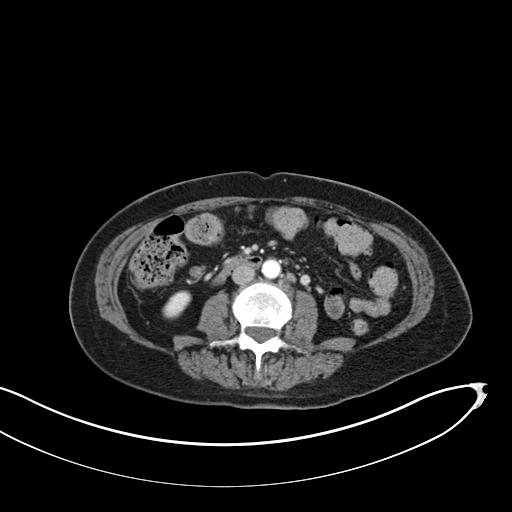
[im 60/100  soft-tissue]
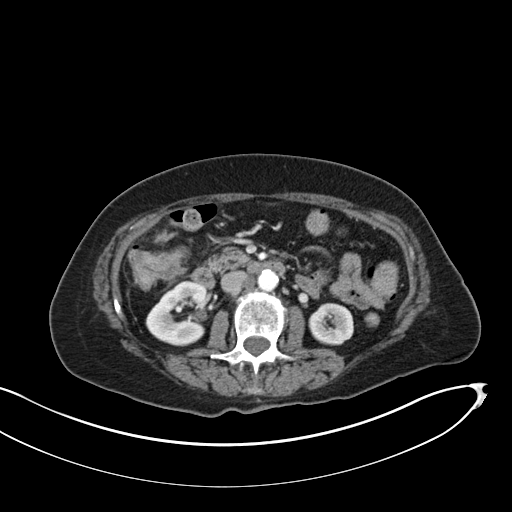
[im 67/100  soft-tissue]
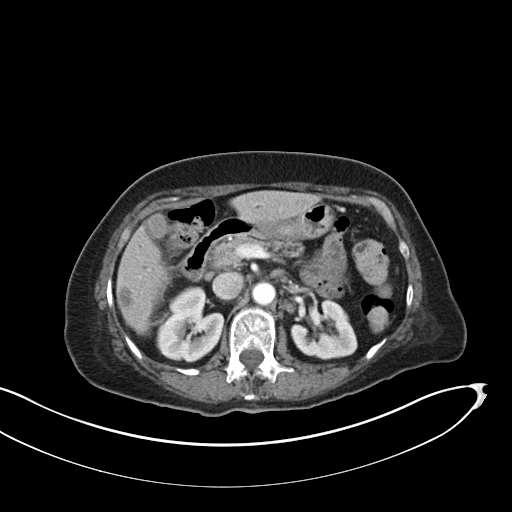
[im 67/100  bone]
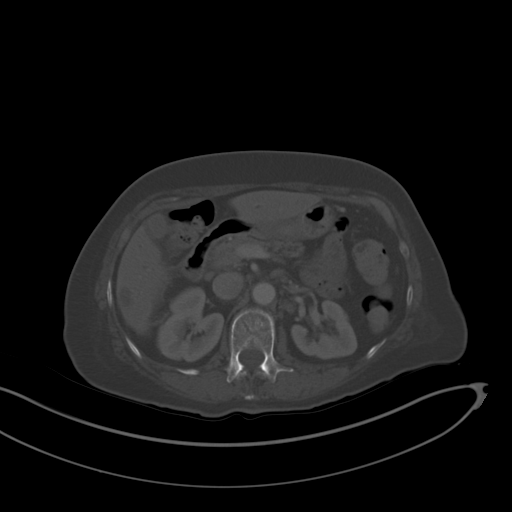
[im 80/100  soft-tissue]
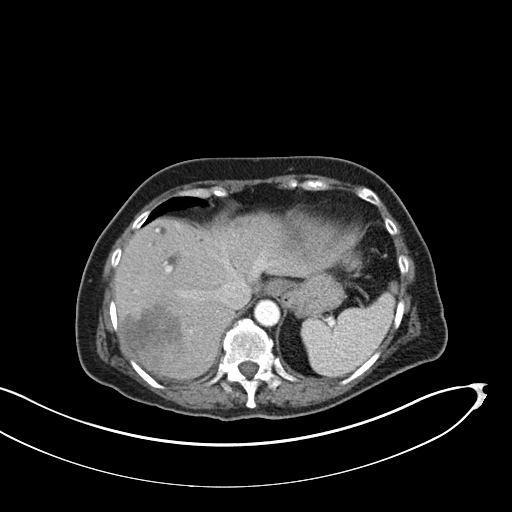
[im 86/100  soft-tissue]
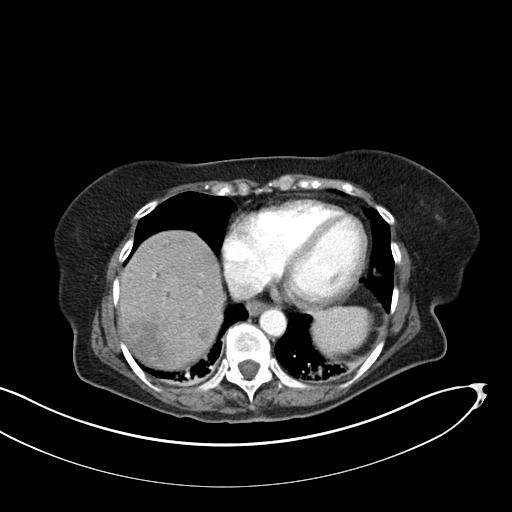
[im 93/100  soft-tissue]
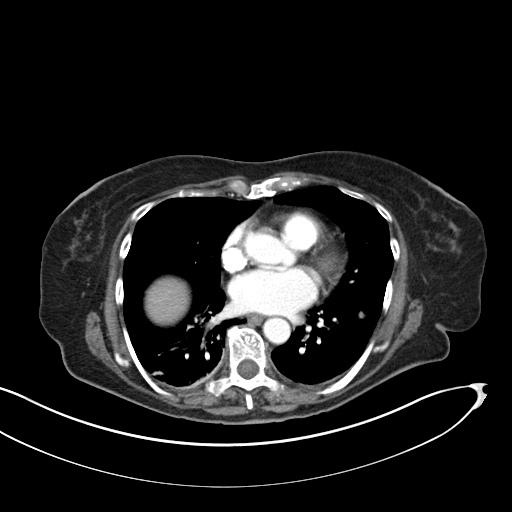

[Series 5: coronal st · coronal · 0.77mm/px · 3 of 147 slices shown]
[im 49/147  soft-tissue]
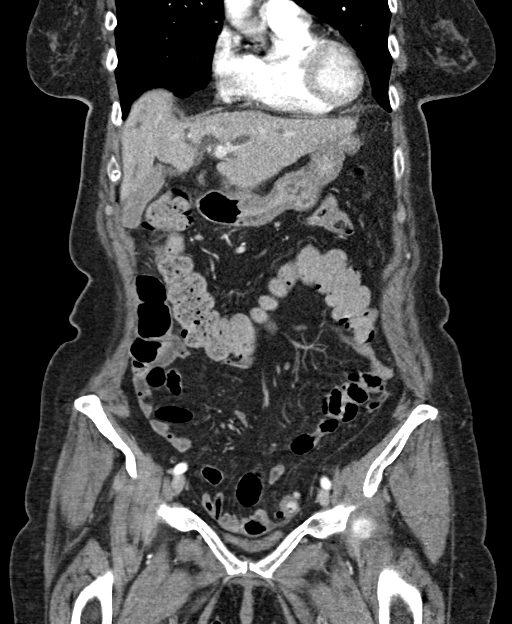
[im 65/147  soft-tissue]
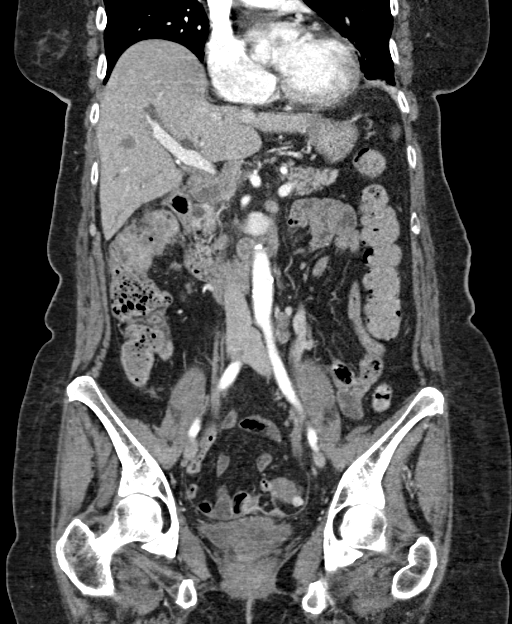
[im 82/147  soft-tissue]
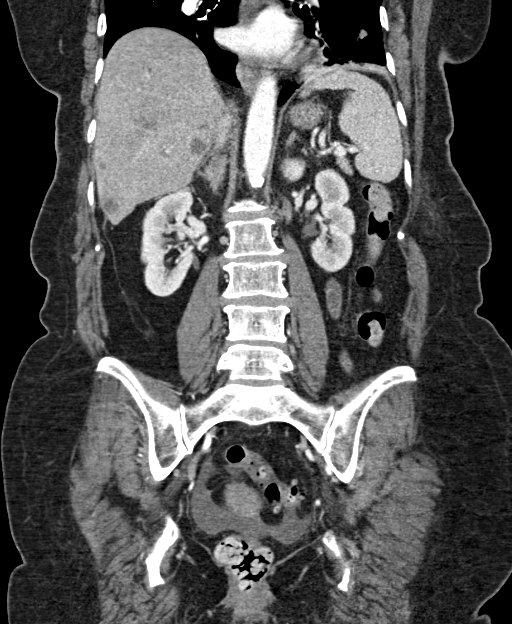

[15 of 46 positions shown; findings below may reference images not displayed]

FINDINGS: Lower chest: Bibasilar subpleural atelectasis/scarring. A 7 mm
nodular density in the left lower lobe most consistent with
metastatic disease.

No intra-abdominal free air.  Small free fluid in the pelvis.

Hepatobiliary: Multiple hepatic hypodense lesions consistent with
metastatic disease. There is a 5.3 x 5.1 cm hypoenhancing area in
the right lobe of the liver with small pockets of air corresponding
to the biopsied lesion. This likely represent an infiltrative mass
or metastasis. An infectious process or developing abscess is less
likely but not excluded. No drainable fluid collection identified.
No calcified gallstone. There is dilatation of the intrahepatic and
extrahepatic biliary tree. The common bile duct measures 17 mm in
diameter. There is somewhat narrowing of the central CBD which is
suboptimally evaluated on this CT but may be related to mass effect
and compression by adjacent adenopathy. Further evaluation with
MRI/MRCP is recommended.

Pancreas: There is a 12 x 10 mm low attenuating nodule in the body
of the pancreas ([DATE]). Several additional low attenuating lesions
in the region of the head of the pancreas noted which may represent
peripancreatic adenopathy or metastatic disease. No active
inflammatory changes of the pancreas. No gland atrophy or dilatation
of the main pancreatic duct.

Spleen: Normal in size without focal abnormality.

Adrenals/Urinary Tract: The adrenal glands are unremarkable. The
kidneys, visualized ureters, and urinary bladder appear
unremarkable.

Stomach/Bowel: There is sigmoid diverticulosis without active
inflammatory changes. There is moderate stool throughout the colon.
There is thickened and irregular appearance of the wall of the
ascending colon in the region of the hepatic flexure likely
representing known colonic mass/malignancy. There is no bowel
obstruction. The appendix is normal.

Vascular/Lymphatic: Mild aortoiliac atherosclerotic disease. The IVC
is unremarkable. No portal venous gas. Retroperitoneal adenopathy.

Reproductive: The uterus is grossly unremarkable.

Other: Scattered omental implants consistent with metastatic
disease. The largest implant in the left lower quadrant measures
approximately 17 x 20 mm (65/2).

Musculoskeletal: Degenerative changes of the spine. No acute osseous
pathology.
IMPRESSION: 1. Thickened and irregular appearance of the wall of the ascending
colon in the region of the hepatic flexure likely representing known
colonic mass/malignancy.
2. Multiple hepatic hypodense lesions consistent with metastatic
disease. The largest lesion in the right lobe of the liver has been
biopsied. No drainable fluid collection/abscess.
3. Dilated intrahepatic and extrahepatic biliary tree with findings
concerning for narrowing of the central CBD. Further evaluation with
MRI/MRCP is recommended.
4. Scattered omental implants consistent with metastatic disease.
5. Left lower lobe pulmonary nodule/metastatic disease.
6. Sigmoid diverticulosis. No bowel obstruction. Normal appendix.
7. Aortic Atherosclerosis ([1J]-[1J]).

## 2021-01-18 MED ORDER — MORPHINE SULFATE (PF) 4 MG/ML IV SOLN
4.0000 mg | Freq: Once | INTRAVENOUS | Status: AC
  Administered 2021-01-18: 4 mg via INTRAVENOUS
  Filled 2021-01-18: qty 1

## 2021-01-18 MED ORDER — IOHEXOL 350 MG/ML SOLN
80.0000 mL | Freq: Once | INTRAVENOUS | Status: AC | PRN
  Administered 2021-01-18: 80 mL via INTRAVENOUS

## 2021-01-18 MED ORDER — SODIUM CHLORIDE 0.9 % IV SOLN: INTRAVENOUS | Status: DC

## 2021-01-18 NOTE — Progress Notes (Signed)
Symptom Management Consult note [place]    Patient Care Team: Jene Every, MD as PCP - General (Family Medicine)    Name of the patient: Norma Morales  [id number]  [birth year]   Date of visit: 01/18/2021    Chief complaint/ Reason for visit- abdominal pain  Oncology History  Metastatic disease ([place])  01/05/2021 Initial Diagnosis   Metastatic disease ([place])   01/25/2021 -  Chemotherapy   Patient is on Treatment Plan : COLORECTAL FOLFOX + Bevacizumab q14d       Current Therapy: none  Interval history- Norma Morales is a 75 yo female with history of hyperlipidemia presenting to [facility] today with chief complaint of severe abdominal pain x 1 day. Pain is located in her RUQ. Pain is constant and sharp, does not radiate. Pain is 10/10 in severity. She reports associated nausea and fever today. Tmax of 101.2. No medications PTA. She has had difficulty passing stool for the last 4 days. Admits to passing 1 small round ball of stool this AM. Denies passing flatus today. She states pain is worse with cough, inspiration and movement. No sick contacts. Had flu and pneumonia vaccinations earlier this week. Denies chills, cough, chest pain, shortness of breath, emesis, urinary symptoms, diarrhea, blood in stool.  Patient had CT AP 12/28/20 that was concerning for primary colonic neoplasm, pancreatic neck mass and multiple hepatic masses. Patient underwent liver mass biopsy and port placement yesterday with IR. She reports tolerating those procedures well. Chart reviews shows patient had colonoscopy 01/06/21 at a [facility] with impression: circumferential, near obstructing colon mass concerning for malignancy. Further chart review from oncologist Dr. Libby Maw note today is plan for palliative chemo. Patient has not yet started, currently waiting for biopsy results.    ROS  All other systems are reviewed and are negative for acute change except as noted in the  HPI.    Allergies  Allergen Reactions   Aspirin Hives     Past Medical History:  Diagnosis Date   Cancer ([place])    colon   Hyperlipidemia      Past Surgical History:  Procedure Laterality Date   IR IMAGING GUIDED PORT INSERTION  01/17/2021   IR US GUIDE BX ASP/DRAIN  01/17/2021   TONSILLECTOMY      Social History   Socioeconomic History   Marital status: Married    Spouse name: Not on file   Number of children: Not on file   Years of education: Not on file   Highest education level: Not on file  Occupational History   Not on file  Tobacco Use   Smoking status: Never   Smokeless tobacco: Never  Substance and Sexual Activity   Alcohol use: Never   Drug use: Never   Sexual activity: Not on file  Other Topics Concern   Not on file  Social History Narrative   Not on file   Social Determinants of Health   Financial Resource Strain: Not on file  Food Insecurity: Not on file  Transportation Needs: Not on file  Physical Activity: Not on file  Stress: Not on file  Social Connections: Not on file  Intimate Partner Violence: Not on file    No family history on file.   Current Outpatient Medications:    alendronate (FOSAMAX) 70 MG tablet, TAKE 1 TABLET BY MOUTH EVERY SEVEN DAYS., Disp: , Rfl:    ascorbic acid (VITAMIN C) 500 MG tablet, Take by mouth., Disp: , Rfl:  atenolol (TENORMIN) 25 MG tablet, Take by mouth., Disp: , Rfl:    b complex vitamins capsule, Take 1 capsule by mouth daily., Disp: , Rfl:    bisacodyl (DULCOLAX) 5 MG EC tablet, Take by mouth., Disp: , Rfl:    Calcium Carbonate (CALCIUM 600 PO), Take by mouth daily., Disp: , Rfl:    Cholecalciferol 25 MCG (1000 UT) capsule, Take by mouth., Disp: , Rfl:    Coenzyme Q10 (COQ10 PO), Take 50 mg by mouth daily., Disp: , Rfl:    ezetimibe (ZETIA) 10 MG tablet, Take 1 tablet by mouth daily., Disp: , Rfl:    Glucosamine-Chondroitin (GLUCOSAMINE CHONDR COMPLEX PO), Take by mouth daily., Disp: , Rfl:     Omega-3 1000 MG CAPS, Take by mouth., Disp: , Rfl:    polyethylene glycol powder (GLYCOLAX/MIRALAX) 17 GM/SCOOP powder, Take by mouth., Disp: , Rfl:    Probiotic Product (PROBIOTIC BLEND PO), Take by mouth daily., Disp: , Rfl:    Resveratrol-Quercetin 100-100 MG TABS, Take by mouth., Disp: , Rfl:    Vitamin E 268 MG (400 UNIT) CAPS, Take by mouth daily., Disp: , Rfl:    Zinc 50 MG TABS, Take by mouth daily., Disp: , Rfl:  No current facility-administered medications for this visit.  Facility-Administered Medications Ordered in Other Visits:    0.9 %  sodium chloride infusion, , Intravenous, Continuous, Lacretia Leigh, MD  PHYSICAL EXAM: ECOG FS:1 - Symptomatic but completely ambulatory    Vitals:   01/18/21 1630  BP: (!) 126/55  Pulse: 83  Temp: 98.2 F (36.8 C)  TempSrc: Oral  SpO2: 99%  Weight: 146 lb 9.6 oz (66.5 kg)   Physical Exam Vitals and nursing note reviewed.  Constitutional:      General: She is not in acute distress.    Appearance: She is not ill-appearing.     Comments: Uncomfortable appearing female  HENT:     Head: Normocephalic and atraumatic.     Right Ear: Tympanic membrane and external ear normal.     Left Ear: Tympanic membrane and external ear normal.     Nose: Nose normal.     Mouth/Throat:     Mouth: Mucous membranes are moist.     Pharynx: Oropharynx is clear.  Eyes:     General: No scleral icterus.       Right eye: No discharge.        Left eye: No discharge.     Extraocular Movements: Extraocular movements intact.     Conjunctiva/sclera: Conjunctivae normal.     Pupils: Pupils are equal, round, and reactive to light.  Neck:     Vascular: No JVD.  Cardiovascular:     Rate and Rhythm: Normal rate and regular rhythm.     Pulses: Normal pulses.          Radial pulses are 2+ on the right side and 2+ on the left side.     Heart sounds: Normal heart sounds.  Pulmonary:     Comments: Lungs clear to auscultation in all fields. Symmetric chest  rise. No wheezing, rales, or rhonchi. Chest:     Comments: Port in right upper chest. Bandage still in place. No surrounding erythema visualized. Bandage is clean, dry and intact. Abdominal:     General: Bowel sounds are decreased.     Comments: Abdomen is soft. Tender to palpation of RUQ, RLQ, and epigastric areas. No rigidity, no guarding. No peritoneal signs.  Band aid over liver biopsy site with minimal bruising  noted.  Musculoskeletal:        General: Normal range of motion.     Cervical back: Normal range of motion.     Right lower leg: No edema.     Left lower leg: No edema.  Skin:    General: Skin is warm and dry.     Capillary Refill: Capillary refill takes less than 2 seconds.  Neurological:     Mental Status: She is oriented to person, place, and time.     GCS: GCS eye subscore is 4. GCS verbal subscore is 5. GCS motor subscore is 6.     Comments: Fluent speech, no facial droop.  Psychiatric:        Behavior: Behavior normal.       LABORATORY DATA: I have reviewed the data as listed CBC Latest Ref Rng & Units 01/18/2021 01/17/2021 01/04/2021  WBC 4.0 - 10.5 K/uL 13.0(H) 14.0(H) 11.0(H)  Hemoglobin 12.0 - 15.0 g/dL 10.5(L) 11.1(L) 10.8(L)  Hematocrit 36.0 - 46.0 % 32.7(L) 34.8(L) 33.3(L)  Platelets 150 - 400 K/uL 431(H) 454(H) 378     CMP Latest Ref Rng & Units 01/18/2021 01/17/2021 01/04/2021  Glucose 70 - 99 mg/dL 119(H) 114(H) 91  BUN 8 - 23 mg/dL 10 9 6(L)  Creatinine 0.44 - 1.00 mg/dL 0.66 0.64 0.67  Sodium 135 - 145 mmol/L 132(L) 137 138  Potassium 3.5 - 5.1 mmol/L 4.0 4.1 4.0  Chloride 98 - 111 mmol/L 98 100 102  CO2 22 - 32 mmol/L 25 26 26   Calcium 8.9 - 10.3 mg/dL 9.0 9.2 9.3  Total Protein 6.5 - 8.1 g/dL 7.1 7.7 7.2  Total Bilirubin 0.3 - 1.2 mg/dL 0.8 0.9 0.4  Alkaline Phos 38 - 126 U/L 443(H) 404(H) 228(H)  AST 15 - 41 U/L 246(HH) 201(H) 74(H)  ALT 0 - 44 U/L 234(H) 187(H) 54(H)       RADIOGRAPHIC STUDIES: I have personally reviewed the  radiological images as listed and agreed with the findings in the report. No images are attached to the encounter. CT Chest W Contrast  Result Date: 01/11/2021 CLINICAL DATA:  Staging for metastatic disease in the abdomen/pelvis EXAM: CT CHEST WITH CONTRAST TECHNIQUE: Multidetector CT imaging of the chest was performed during intravenous contrast administration. CONTRAST:  12mL OMNIPAQUE IOHEXOL 350 MG/ML SOLN COMPARISON:  None. FINDINGS: Cardiovascular: No significant vascular findings. Normal heart size. No pericardial effusion. Mediastinum/Nodes: No enlarged mediastinal, hilar, or axillary lymph nodes. Thyroid gland, trachea, and esophagus demonstrate no significant findings. Lungs/Pleura: Trachea and central bronchi are patent. No endobronchial lesion. Biapical pleural/parenchymal scarring. 2 mm nodular density in the anterior aspect of the right upper lobe (status 7 image 47). 3 mm nodular density in the posterior right lower lobe (series 7 image 80). There is a 6 mm density in the medial aspect of the left lower lobe abutting the pleura (series 7 image 122). Another density on the same image measuring 1.0 x 0.3 cm. Another density in the posterolateral aspect of the left lower lobe (image 115) measuring approximately 0.8 x 0.9 cm. And other nodular density in the left lower lobe on image 92 measuring approximately 4 mm. Upper Abdomen: There is a ill-defined hypodense lesion in the right hepatic lobe on series 2 image 136, measures at least 5.5 x 4.0 cm. Few other small hypodense lesions in the liver. Musculoskeletal: No chest wall abnormality. No acute or significant osseous findings. IMPRESSION: 1. Multiple small pulmonary densities in the bilateral lower lobes, of indeterminate etiology, this may  represent atelectasis or metastatic disease. Short-term follow-up examination in 3-6 months is recommended. 2. Ill-defined hypodense lesion in the liver measuring at least 5.5 x 4.0 cm as well as few small  hypodense lesions concerning for malignant process. 3.  No mediastinal lymphadenopathy. 4.  No suspicious osseous lesion. Electronically Signed   By: Keane Police D.O.   On: 01/11/2021 15:01   MR SACRUM SI JOINTS W WO CONTRAST  Result Date: 01/10/2021 CLINICAL DATA:  Suspected bone lesion seen on prior CT examination EXAM: MRI SACRUM WITH AND WITHOUT CONTRAST TECHNIQUE: Multiplanar multi-sequence MR imaging of the sacrum was performed without and with intravenous contrast. COMPARISON:  None. CT examination dated December 29, 2020 FINDINGS: Bones/Joint/Cartilage No fracture or dislocation. Normal alignment. No joint effusion. No marrow signal abnormality. No SI joint widening or erosive changes. No subchondral reactive marrow changes. No SI joint effusion. There is a T1 hypointense, T2 hyperintense nonenhancing structure abutting the left dorsal root of S2 measuring approximately 1.3 x 1.6 x 1.7 cm, most consistent with a Tarlov cyst, a benign process. No focal lumbar spine abnormality. Degenerate disc disease of the L5-S1. Ligaments, Muscles and Tendons Muscles are normal. No muscle atrophy. No muscle edema. Piriformis muscles are normal bilaterally without signal abnormality. Soft tissue No fluid collection or hematoma. No soft tissue mass. Normal neurovascular bundles. Visualized pelvic viscera are unremarkable. IMPRESSION: 1.  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 2.  No suspicious osseous lesion. Electronically Signed   By: Keane Police D.O.   On: 01/10/2021 17:50   IR US Guide Bx Asp/Drain  Result Date: 01/17/2021 INDICATION: HEPATIC METASTATIC DISEASE. EXAM: ULTRASOUND CORE BIOPSY RIGHT HEPATIC MASS MEDICATIONS: 1% LIDOCAINE ANESTHESIA/SEDATION: Moderate (conscious) sedation was employed during this procedure. A total of Versed 2.0 mg and Fentanyl 100 mcg was administered intravenously by the radiology nurse. Total intra-service moderate Sedation Time: 10 minutes. The patient's  level of consciousness and vital signs were monitored continuously by radiology nursing throughout the procedure under my direct supervision. FLUOROSCOPY TIME:  Fluoroscopy Time: None. COMPLICATIONS: None immediate. PROCEDURE: Informed written consent was obtained from the patient after a thorough discussion of the procedural risks, benefits and alternatives. All questions were addressed. Maximal Sterile Barrier Technique was utilized including caps, mask, sterile gowns, sterile gloves, sterile drape, hand hygiene and skin antiseptic. A timeout was performed prior to the initiation of the procedure. Previous imaging reviewed. Preliminary ultrasound performed. Right hepatic lesion was localized and marked for a mid axillary line approach through a lower intercostal space. Under sterile conditions and local anesthesia, a 17 gauge 11.8 cm guide was advanced to the lesion. Needle position confirmed with ultrasound. Images obtained for documentation. 18 gauge core biopsies obtained under direct ultrasound. Samples were intact and non fragmented. These were placed in formalin. Needle tract occluded with Gel-Foam. Postprocedure imaging demonstrates no hemorrhage or hematoma. Patient tolerated biopsy well. IMPRESSION: Successful ultrasound right hepatic mass 18 gauge core biopsy Electronically Signed   By: Jerilynn Mages.  Shick M.D.   On: 01/17/2021 14:56   IR IMAGING GUIDED PORT INSERTION  Result Date: 01/17/2021 CLINICAL DATA:  Imaging findings consistent with metastatic colon cancer to liver EXAM: RIGHT INTERNAL JUGULAR SINGLE LUMEN POWER PORT CATHETER INSERTION Date:  01/17/2021 01/17/2021 2:30 pm Radiologist:  M. Daryll Brod, MD Guidance:  Ultrasound and fluoroscopic MEDICATIONS: 1% lidocaine local with epinephrine ANESTHESIA/SEDATION: Versed 2.0 mg IV; Fentanyl 100 mcg IV; Moderate Sedation Time:  23 minute The patient was continuously monitored during the procedure  by the interventional radiology nurse under my direct  supervision. FLUOROSCOPY TIME:  0 minutes, 36 seconds (1 mGy) COMPLICATIONS: None immediate. CONTRAST:  None. PROCEDURE: Informed consent was obtained from the patient following explanation of the procedure, risks, benefits and alternatives. The patient understands, agrees and consents for the procedure. All questions were addressed. A time out was performed. Maximal barrier sterile technique utilized including caps, mask, sterile gowns, sterile gloves, large sterile drape, hand hygiene, and 2% chlorhexidine scrub. Under sterile conditions and local anesthesia, right internal jugular micropuncture venous access was performed. Access was performed with ultrasound. Images were obtained for documentation of the patent right internal jugular vein. A guide wire was inserted followed by a transitional dilator. This allowed insertion of a guide wire and catheter into the IVC. Measurements were obtained from the SVC / RA junction back to the right IJ venotomy site. In the right infraclavicular chest, a subcutaneous pocket was created over the second anterior rib. This was done under sterile conditions and local anesthesia. 1% lidocaine with epinephrine was utilized for this. A 2.5 cm incision was made in the skin. Blunt dissection was performed to create a subcutaneous pocket over the right pectoralis major muscle. The pocket was flushed with saline vigorously. There was adequate hemostasis. The port catheter was assembled and checked for leakage. The port catheter was secured in the pocket with two retention sutures. The tubing was tunneled subcutaneously to the right venotomy site and inserted into the SVC/RA junction through a valved peel-away sheath. Position was confirmed with fluoroscopy. Images were obtained for documentation. The patient tolerated the procedure well. No immediate complications. Incisions were closed in a two layer fashion with 4 - 0 Vicryl suture. Dermabond was applied to the skin. The port catheter  was accessed, blood was aspirated followed by saline and heparin flushes. Needle was removed. A dry sterile dressing was applied. IMPRESSION: Ultrasound and fluoroscopically guided right internal jugular single lumen power port catheter insertion. Tip in the SVC/RA junction. Catheter ready for use. Electronically Signed   By: Jerilynn Mages.  Shick M.D.   On: 01/17/2021 14:53     ASSESSMENT & PLAN: Patient is a 75 y.o. female with working diagnosis of metastatic colon cancer who recently established care with [facility] 01/04/21.  #)Abdominal pain-  Patient afebrile, HDS. On exam she is uncomfortable appearing. Has tenderness to RUQ, RLQ, and epigastric areas with decreased bowel sounds. Labs here show leukocytosis 13, hemoglobin 10.5 consistent with baseline. Patient had leukocytosis yesterday of 14, denies any recent steroid use, ?infection related given her reported fever of 101.2 earlier today. CMP shows mild hyponatremia 132, LFTs are elevated compared to labs yesterday, AST 201--> 246 and ALT 404-->443. No renal insufficiency. Given patient's complaints, exam, and lab findings there is concern for possible infection vs SBO, ddx also includes worsening cancer related pain, perforation, cholecystitis, pancreatitis, appendicitis, constipation. Discussed with patient that imaging would be needed to r/o emergent conditions. Patient and spouse agree with plan for ED evaluation. Unfortunately no ED beds are currently available after discussion with ED charge RN. Patient transported to ED lobby. CT AP and additional labs ordered by ED physician after giving report. Patient stable at time of transfer.   Visit Diagnosis: 1. Abdominal pain, unspecified abdominal location   2. Mass of colon      No orders of the defined types were placed in this encounter.   All questions were answered.  No barriers to learning was detected.  I have spent a  total of 30 minutes minutes of face-to-face and  non-face-to-face time, preparing to see the patient, obtaining and/or reviewing separately obtained history, performing a medically appropriate examination, counseling and educating the patient, ordering tests,  documenting clinical information in the electronic health record, and care coordination.     Thank you for allowing me to participate in the care of this patient.    Barrie Folk, PA-C Department of Hematology/Oncology [facility] at [facility] Phone: [phone]  [phone]    01/18/2021 5:22 PM

## 2021-01-18 NOTE — ED Provider Notes (Addendum)
[facility] DEPT Provider Note   CSN: [id number] Arrival date & time: 01/18/21  1644     History Chief Complaint  Patient presents with   Abdominal Pain    Norma Morales is a 75 y.o. female who presents for evaluation of right upper quadrant pain.  Pain described as sharp, nonradiating.  Pain is worse when sneezing coughing or laughing.  No alleviating factors.  She was seen at her oncologist yesterday for liver biopsy and for HD port placement.  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  Currently waiting results for her liver biopsy.  Prior to this patient was relatively healthy and was only taking medication for her cholesterol.  Patient denies chest pain, nausea, vomiting, diarrhea, fevers chills and myalgias.  She has no history of abdominal surgery.   Abdominal Pain Associated symptoms: no diarrhea, no fever, no nausea, no shortness of breath and no vomiting       Past Medical History:  Diagnosis Date   Cancer [facility])    colon   Hyperlipidemia     Patient Active Problem List   Diagnosis Date Noted   Metastatic disease ([place]) 01/05/2021    Past Surgical History:  Procedure Laterality Date   IR IMAGING GUIDED PORT INSERTION  01/17/2021   IR US GUIDE BX ASP/DRAIN  01/17/2021   TONSILLECTOMY       OB History   No obstetric history on file.     History reviewed. No pertinent family history.  Social History   Tobacco Use   Smoking status: Never   Smokeless tobacco: Never  Substance Use Topics   Alcohol use: Never   Drug use: Never    Home Medications Prior to Admission medications   Medication Sig Start Date End Date Taking? Authorizing Provider  alendronate (FOSAMAX) 70 MG tablet TAKE 1 TABLET BY MOUTH EVERY SEVEN DAYS. 12/28/20   [provider]  ascorbic acid (VITAMIN C) 500 MG tablet Take by mouth.    [provider]  atenolol (TENORMIN) 25 MG tablet Take  by mouth. 06/14/10   [provider]  b complex vitamins capsule Take 1 capsule by mouth daily.    [provider]  bisacodyl (DULCOLAX) 5 MG EC tablet Take by mouth. 01/02/21 02/01/21  [provider]  Calcium Carbonate (CALCIUM 600 PO) Take by mouth daily.    [provider]  Cholecalciferol 25 MCG (1000 UT) capsule Take by mouth.    [provider]  Coenzyme Q10 (COQ10 PO) Take 50 mg by mouth daily.    [provider]  ezetimibe (ZETIA) 10 MG tablet Take 1 tablet by mouth daily. 07/07/20   [provider]  Glucosamine-Chondroitin (GLUCOSAMINE CHONDR COMPLEX PO) Take by mouth daily.    [provider]  Omega-3 1000 MG CAPS Take by mouth.    [provider]  polyethylene glycol powder (GLYCOLAX/MIRALAX) 17 GM/SCOOP powder Take by mouth. 12/19/20 01/18/21  [provider]  Probiotic Product (PROBIOTIC BLEND PO) Take by mouth daily.    [provider]  Resveratrol-Quercetin 100-100 MG TABS Take by mouth.    [provider]  Vitamin E 268 MG (400 UNIT) CAPS Take by mouth daily.    [provider]  Zinc 50 MG TABS Take by mouth daily.    [provider]    Allergies    Aspirin  Review of Systems   Review of Systems  Constitutional:  Negative for fever.  HENT: Negative.    Eyes: Negative.   Respiratory:  Negative for shortness of breath.   Cardiovascular: Negative.   Gastrointestinal:  Positive for abdominal pain. Negative for diarrhea, nausea and vomiting.  Endocrine: Negative.   Genitourinary: Negative.   Musculoskeletal: Negative.   Skin:  Negative for rash.  Neurological:  Negative for headaches.  All other systems reviewed and are negative.  Physical Exam Updated Vital Signs BP (!) 105/54   Pulse 76   Temp 98.3 F (36.8 C) (Oral)   Resp (!) 21   Ht 5\' 3"  (1.6 m)   Wt 66.2 kg   SpO2 95%   BMI 25.86 kg/m   Physical Exam Vitals and nursing note  reviewed.  Constitutional:      General: She is not in acute distress.    Appearance: She is not ill-appearing.  HENT:     Head: Atraumatic.  Eyes:     Conjunctiva/sclera: Conjunctivae normal.  Cardiovascular:     Rate and Rhythm: Normal rate and regular rhythm.     Pulses: Normal pulses.          Radial pulses are 2+ on the right side and 2+ on the left side.       Dorsalis pedis pulses are 2+ on the right side and 2+ on the left side.     Heart sounds: No murmur heard. Pulmonary:     Effort: Pulmonary effort is normal. No respiratory distress.     Breath sounds: Normal breath sounds.  Abdominal:     General: Abdomen is flat. There is no distension.     Palpations: Abdomen is soft.     Tenderness: There is no abdominal tenderness.     Comments: Abdomen is soft nondistended.  Exquisite tenderness to palpation in the right upper quadrant.  Mild TTP in the bilateral lower quadrants.  Negative Murphy sign.  No rebound tenderness.  Normoactive bowel sounds.  No retroperitoneal signs.  Musculoskeletal:        General: Normal range of motion.     Cervical back: Normal range of motion.     Right lower leg: No edema.     Left lower leg: No edema.  Skin:    General: Skin is warm and dry.     Capillary Refill: Capillary refill takes less than 2 seconds.  Neurological:     General: No focal deficit present.     Mental Status: She is alert.  Psychiatric:        Mood and Affect: Mood normal.    ED Results / Procedures / Treatments   Labs (all labs ordered are listed, but only abnormal results are displayed) Labs Reviewed  URINALYSIS, ROUTINE W REFLEX MICROSCOPIC - Abnormal; Notable for the following components:      Result Value   Ketones, ur 20 (*)    Leukocytes,Ua TRACE (*)    All other components within normal limits  RESP PANEL BY RT-PCR (FLU A&B, COVID) ARPGX2  CULTURE, BLOOD (ROUTINE X 2)  CULTURE, BLOOD (ROUTINE X 2)  LIPASE, BLOOD  LACTIC ACID, PLASMA  LACTIC ACID,  PLASMA    EKG None  Radiology CT Abdomen Pelvis W Contrast  Result Date: 01/18/2021 CLINICAL DATA:  Concern for abdominal abscess or infection. History of metastatic colon cancer. Status post core biopsy of right hepatic mass. EXAM: CT ABDOMEN AND PELVIS WITH CONTRAST TECHNIQUE: Multidetector CT imaging of the abdomen and pelvis was performed using the standard protocol following bolus administration of intravenous contrast. CONTRAST:  59mL OMNIPAQUE IOHEXOL 350 MG/ML SOLN COMPARISON:  None. FINDINGS: Lower chest: Bibasilar subpleural atelectasis/scarring. A 7 mm nodular density in the left lower lobe most consistent with metastatic disease. No intra-abdominal free air.  Small free fluid in the pelvis. Hepatobiliary: Multiple hepatic hypodense lesions consistent with metastatic disease. There is a 5.3 x 5.1 cm hypoenhancing area in the right lobe of the liver with small pockets of air corresponding to the biopsied lesion. This likely represent an infiltrative mass or metastasis. An infectious process or developing abscess is less likely but not excluded. No drainable fluid collection identified. No calcified gallstone. There is dilatation of the intrahepatic and extrahepatic biliary tree. The common bile duct measures 17 mm in diameter. There is somewhat narrowing of the central CBD which is suboptimally evaluated on this CT but may be related to mass effect and compression by adjacent adenopathy. Further evaluation with MRI/MRCP is recommended. Pancreas: There is a 12 x 10 mm low attenuating nodule in the body of the pancreas (31/2). Several additional low attenuating lesions in the region of the head of the pancreas noted which may represent peripancreatic adenopathy or metastatic disease. No active inflammatory changes of the pancreas. No gland atrophy or dilatation of the main pancreatic duct. Spleen: Normal in size without focal abnormality. Adrenals/Urinary Tract: The adrenal glands are unremarkable.  The kidneys, visualized ureters, and urinary bladder appear unremarkable. Stomach/Bowel: There is sigmoid diverticulosis without active inflammatory changes. There is moderate stool throughout the colon. There is thickened and irregular appearance of the wall of the ascending colon in the region of the hepatic flexure likely representing known colonic mass/malignancy. There is no bowel obstruction. The appendix is normal. Vascular/Lymphatic: Mild aortoiliac atherosclerotic disease. The IVC is unremarkable. No portal venous gas. Retroperitoneal adenopathy. Reproductive: The uterus is grossly unremarkable. Other: Scattered omental implants consistent with metastatic disease. The largest implant in the left lower quadrant measures approximately 17 x 20 mm (65/2). Musculoskeletal: Degenerative changes of the spine. No acute osseous pathology. IMPRESSION: 1. Thickened and irregular appearance of the wall of the ascending colon in the region of the hepatic flexure likely representing known colonic mass/malignancy. 2. Multiple hepatic hypodense lesions consistent with metastatic disease. The largest lesion in the right lobe of the liver has been biopsied. No drainable fluid collection/abscess. 3. Dilated intrahepatic and extrahepatic biliary tree with findings concerning for narrowing of the central CBD. Further evaluation with MRI/MRCP is recommended. 4. Scattered omental implants consistent with metastatic disease. 5. Left lower lobe pulmonary nodule/metastatic disease. 6. Sigmoid diverticulosis. No bowel obstruction. Normal appendix. 7. Aortic Atherosclerosis (ICD10-I70.0). Electronically Signed   By: Anner Crete M.D.   On: 01/18/2021 21:04   IR US Guide Bx Asp/Drain  Result Date: 01/17/2021 INDICATION: HEPATIC METASTATIC DISEASE. EXAM: ULTRASOUND CORE BIOPSY RIGHT HEPATIC MASS MEDICATIONS: 1% LIDOCAINE ANESTHESIA/SEDATION: Moderate (conscious) sedation was employed during this procedure. A total of Versed 2.0  mg and Fentanyl 100 mcg was administered intravenously by the radiology nurse. Total intra-service moderate Sedation Time: 10 minutes. The patient's level of consciousness and vital signs were monitored continuously by radiology nursing throughout the procedure under my direct supervision. FLUOROSCOPY TIME:  Fluoroscopy Time: None. COMPLICATIONS: None immediate. PROCEDURE: Informed written consent was obtained from the patient after a thorough discussion of the procedural risks, benefits and alternatives. All questions were addressed. Maximal Sterile Barrier Technique was utilized including caps, mask, sterile gowns, sterile gloves, sterile drape, hand hygiene and skin antiseptic. A timeout was performed prior to the initiation of the procedure.  Previous imaging reviewed. Preliminary ultrasound performed. Right hepatic lesion was localized and marked for a mid axillary line approach through a lower intercostal space. Under sterile conditions and local anesthesia, a 17 gauge 11.8 cm guide was advanced to the lesion. Needle position confirmed with ultrasound. Images obtained for documentation. 18 gauge core biopsies obtained under direct ultrasound. Samples were intact and non fragmented. These were placed in formalin. Needle tract occluded with Gel-Foam. Postprocedure imaging demonstrates no hemorrhage or hematoma. Patient tolerated biopsy well. IMPRESSION: Successful ultrasound right hepatic mass 18 gauge core biopsy Electronically Signed   By: Jerilynn Mages.  Shick M.D.   On: 01/17/2021 14:56   IR IMAGING GUIDED PORT INSERTION  Result Date: 01/17/2021 CLINICAL DATA:  Imaging findings consistent with metastatic colon cancer to liver EXAM: RIGHT INTERNAL JUGULAR SINGLE LUMEN POWER PORT CATHETER INSERTION Date:  01/17/2021 01/17/2021 2:30 pm Radiologist:  M. Daryll Brod, MD Guidance:  Ultrasound and fluoroscopic MEDICATIONS: 1% lidocaine local with epinephrine ANESTHESIA/SEDATION: Versed 2.0 mg IV; Fentanyl 100 mcg IV;  Moderate Sedation Time:  23 minute The patient was continuously monitored during the procedure by the interventional radiology nurse under my direct supervision. FLUOROSCOPY TIME:  0 minutes, 36 seconds (1 mGy) COMPLICATIONS: None immediate. CONTRAST:  None. PROCEDURE: Informed consent was obtained from the patient following explanation of the procedure, risks, benefits and alternatives. The patient understands, agrees and consents for the procedure. All questions were addressed. A time out was performed. Maximal barrier sterile technique utilized including caps, mask, sterile gowns, sterile gloves, large sterile drape, hand hygiene, and 2% chlorhexidine scrub. Under sterile conditions and local anesthesia, right internal jugular micropuncture venous access was performed. Access was performed with ultrasound. Images were obtained for documentation of the patent right internal jugular vein. A guide wire was inserted followed by a transitional dilator. This allowed insertion of a guide wire and catheter into the IVC. Measurements were obtained from the SVC / RA junction back to the right IJ venotomy site. In the right infraclavicular chest, a subcutaneous pocket was created over the second anterior rib. This was done under sterile conditions and local anesthesia. 1% lidocaine with epinephrine was utilized for this. A 2.5 cm incision was made in the skin. Blunt dissection was performed to create a subcutaneous pocket over the right pectoralis major muscle. The pocket was flushed with saline vigorously. There was adequate hemostasis. The port catheter was assembled and checked for leakage. The port catheter was secured in the pocket with two retention sutures. The tubing was tunneled subcutaneously to the right venotomy site and inserted into the SVC/RA junction through a valved peel-away sheath. Position was confirmed with fluoroscopy. Images were obtained for documentation. The patient tolerated the procedure well. No  immediate complications. Incisions were closed in a two layer fashion with 4 - 0 Vicryl suture. Dermabond was applied to the skin. The port catheter was accessed, blood was aspirated followed by saline and heparin flushes. Needle was removed. A dry sterile dressing was applied. IMPRESSION: Ultrasound and fluoroscopically guided right internal jugular single lumen power port catheter insertion. Tip in the SVC/RA junction. Catheter ready for use. Electronically Signed   By: Jerilynn Mages.  Shick M.D.   On: 01/17/2021 14:53    Procedures Procedures   Medications Ordered in ED Medications  0.9 %  sodium chloride infusion ( Intravenous New Bag/Given 01/18/21 2009)  iohexol (OMNIPAQUE) 350 MG/ML injection 80 mL (80 mLs Intravenous Contrast Given 01/18/21 2041)  morphine 4 MG/ML injection 4 mg (4 mg Intravenous Given 01/18/21 2346)  ED Course  I have reviewed the triage vital signs and the nursing notes.  Pertinent labs & imaging results that were available during my care of the patient were reviewed by me and considered in my medical decision making (see chart for details).    MDM Rules/Calculators/A&P                         This is a 75 year old female with suspect colon cancer with liver metastasis presents to the ED for evaluation of right upper quadrant pain that started after a liver biopsy done yesterday.  Concern for bowel abscess/infection versus internal abdominal hemorrhage vs pancreatitis vs gallbladder etiology.   Lipase was normal.  Urine normal.  AST abdominal TG slightly elevated from values today prior, probably from damage secondary to biopsy.  White blood count is elevated however appears to be her baseline.  Lactic acid was normal.  Urinalysis without evidence of infection.  Blood cultures were collected.  CT scan did not identify any abscesses or hemorrhage.  Did note some dilated intrahepatic and extrahepatic biliary tree, possible narrowing of the central CBD.  Patient warrants  additional evaluation with MRI/MRCP.  Patient handed off to Dwaine Deter, PA-C who consulted with Dr. Alvera Singh and gastroenterology.  Patient likely to be admitted.  Remainder of treatment and disposition to be decided by admitting physician.  Final Clinical Impression(s) / ED Diagnoses Final diagnoses:  None    Rx / DC Orders ED Discharge Orders     None        Tonye Pearson, PA-C 01/19/21 0017    Tonye Pearson, PA-C 01/19/21 0023    Lacretia Leigh, MD 01/24/21 1520

## 2021-01-18 NOTE — Telephone Encounter (Signed)
Received call from pt. She is calling because she is experiencing new onset severe abdominal pain. Pain increases with breathing and coughing-mid abdominal area. Denies fever, nausea mor vomiting. States she is not passing gas and rare very small BM. She is drinking some but not eating much. Discussed with Dede Query and [facility]. They advise to have pt come here for eval and possible scans to r/o obstruction. Will gets labs here.  Advised pt of the above. She agreed and will have her husband bring her. They live about an hour away in [facility].  Scheduling message sent Labs orders placed.

## 2021-01-18 NOTE — ED Provider Notes (Signed)
I provided a substantive portion of the care of this patient.  I personally performed the entirety of the medical decision making for this encounter.  75 year old female with history of pancreatic cancer presents for possible bowel obstruction.  She has had no fever or emesis.  Abdominal CT was negative for obstruction but does show some narrowing of her common bile duct.  Will need to speak with GI and admit to the hospital service        Lacretia Leigh, MD 01/18/21 2124

## 2021-01-18 NOTE — ED Triage Notes (Signed)
Pt c/o abd pain, fever and nausea, seen at the cancer center today.

## 2021-01-18 NOTE — ED Provider Notes (Signed)
Emergency Medicine Provider Triage Evaluation Note  Norma Morales , a 75 y.o. female  was evaluated in triage.  Pt complains of abdominal pain.  Patient recently diagnosed with colorectal cancer with mets, brought over from the cancer center due to severe abdominal pain that started today and has been persistently worsening.  Patient has not moved her bowels or pass gas today.  Since abdominal pain and concern for possible bowel obstruction.  Patient did have a port placed yesterday and a biopsy done.  Husband also reports a fever of 101.1 at home  Review of Systems  Positive: Abdominal pain, fever, constipation Negative: Vomiting, diarrhea, chest pain, shortness of breath  Physical Exam  BP 131/67   Pulse 89   Temp 99 F (37.2 C) (Oral)   Resp 17   SpO2 97%  Gen:   Awake, no distress   Resp:  Normal effort  MSK:   Moves extremities without difficulty  Other:  Generalized abdominal tenderness without guarding.  Medical Decision Making  Medically screening exam initiated at 5:27 PM.  Appropriate orders placed.  Aubreyana Saltz was informed that the remainder of the evaluation will be completed by another provider, this initial triage assessment does not replace that evaluation, and the importance of remaining in the ED until their evaluation is complete.     Jacqlyn Larsen, PA-C 01/18/21 1743    Sherwood Gambler, MD 01/18/21 [phone]

## 2021-01-18 NOTE — Progress Notes (Signed)
START ON PATHWAY REGIMEN - Colorectal     A cycle is every 14 days:     Bevacizumab-xxxx      Oxaliplatin      Leucovorin      Fluorouracil      Fluorouracil   **Always confirm dose/schedule in your pharmacy ordering system**  Patient Characteristics: Distant Metastases, Nonsurgical Candidate, KRAS/NRAS Mutation Positive/Unknown (BRAF V600 Wild-Type/Unknown), Standard Cytotoxic Therapy, First Line Standard Cytotoxic Therapy, Bevacizumab Eligible, PS = 0,1 Tumor Location: Colon Therapeutic Status: Distant Metastases Microsatellite/Mismatch Repair Status: Unknown BRAF Mutation Status: Awaiting Test Results KRAS/NRAS Mutation Status: Awaiting Test Results Standard Cytotoxic Line of Therapy: First Line Standard Cytotoxic Therapy ECOG Performance Status: 1 Bevacizumab Eligibility: Eligible Intent of Therapy: Non-Curative / Palliative Intent, Discussed with Patient 

## 2021-01-19 ENCOUNTER — Encounter (HOSPITAL_COMMUNITY): Payer: Self-pay | Admitting: Internal Medicine

## 2021-01-19 ENCOUNTER — Emergency Department (HOSPITAL_COMMUNITY): Payer: Medicare HMO

## 2021-01-19 DIAGNOSIS — R1011 Right upper quadrant pain: Principal | ICD-10-CM

## 2021-01-19 DIAGNOSIS — R109 Unspecified abdominal pain: Secondary | ICD-10-CM | POA: Diagnosis present

## 2021-01-19 LAB — CBC WITH DIFFERENTIAL/PLATELET
Abs Immature Granulocytes: 0.04 10*3/uL (ref 0.00–0.07)
Basophils Absolute: 0 10*3/uL (ref 0.0–0.1)
Basophils Relative: 1 %
Eosinophils Absolute: 0.1 10*3/uL (ref 0.0–0.5)
Eosinophils Relative: 1 %
HCT: 28.8 % — ABNORMAL LOW (ref 36.0–46.0)
Hemoglobin: 9.2 g/dL — ABNORMAL LOW (ref 12.0–15.0)
Immature Granulocytes: 1 %
Lymphocytes Relative: 9 %
Lymphs Abs: 0.8 10*3/uL (ref 0.7–4.0)
MCH: 26.7 pg (ref 26.0–34.0)
MCHC: 31.9 g/dL (ref 30.0–36.0)
MCV: 83.5 fL (ref 80.0–100.0)
Monocytes Absolute: 1.1 10*3/uL — ABNORMAL HIGH (ref 0.1–1.0)
Monocytes Relative: 13 %
Neutro Abs: 6.3 10*3/uL (ref 1.7–7.7)
Neutrophils Relative %: 75 %
Platelets: 327 10*3/uL (ref 150–400)
RBC: 3.45 MIL/uL — ABNORMAL LOW (ref 3.87–5.11)
RDW: 13.8 % (ref 11.5–15.5)
WBC: 8.3 10*3/uL (ref 4.0–10.5)
nRBC: 0 % (ref 0.0–0.2)

## 2021-01-19 LAB — BASIC METABOLIC PANEL
Anion gap: 8 (ref 5–15)
BUN: 7 mg/dL — ABNORMAL LOW (ref 8–23)
CO2: 24 mmol/L (ref 22–32)
Calcium: 8.1 mg/dL — ABNORMAL LOW (ref 8.9–10.3)
Chloride: 100 mmol/L (ref 98–111)
Creatinine, Ser: 0.36 mg/dL — ABNORMAL LOW (ref 0.44–1.00)
GFR, Estimated: >60 mL/min (ref >60)
Glucose, Bld: 95 mg/dL (ref 70–99)
Potassium: 3.6 mmol/L (ref 3.5–5.1)
Sodium: 132 mmol/L — ABNORMAL LOW (ref 135–145)

## 2021-01-19 LAB — HEPATIC FUNCTION PANEL
ALT: 188 U/L — ABNORMAL HIGH (ref 0–44)
AST: 220 U/L — ABNORMAL HIGH (ref 15–41)
Albumin: 2.7 g/dL — ABNORMAL LOW (ref 3.5–5.0)
Alkaline Phosphatase: 368 U/L — ABNORMAL HIGH (ref 38–126)
Bilirubin, Direct: 1.2 mg/dL — ABNORMAL HIGH (ref 0.0–0.2)
Indirect Bilirubin: 0.8 mg/dL (ref 0.3–0.9)
Total Bilirubin: 2 mg/dL — ABNORMAL HIGH (ref 0.3–1.2)
Total Protein: 6 g/dL — ABNORMAL LOW (ref 6.5–8.1)

## 2021-01-19 IMAGING — MR MR ABDOMEN WO/W CM MRCP
15 of 20 series · 34 of 48 positions shown · IV contrast (GADAVIST)
Comparison: [DATE].

CLINICAL DATA: Right upper quadrant abdominal pain, nondiagnostic
ultrasound.

EXAM:
MRI ABDOMEN WITHOUT AND WITH CONTRAST (INCLUDING MRCP)
TECHNIQUE: Multiplanar multisequence MR imaging of the abdomen was performed
both before and after the administration of intravenous contrast.
Heavily T2-weighted images of the biliary and pancreatic ducts were
obtained, and three-dimensional MRCP images were rendered by post
processing.
CONTRAST:  7mL GADAVIST GADOBUTROL 1 MMOL/ML IV SOLN

[Series 3: T2 fat-sat · axial · 6.0mm · 1.14mm/px · 1 of 36 slices shown]
[im 1/36]
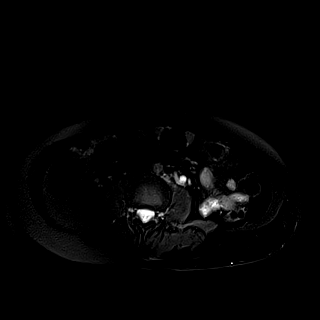

[Series 7: DWI · axial · 6.0mm · 1.36mm/px · z∈[-110,+142]mm · 3 of 72 slices shown (1 of 2)]
[im 1/72]
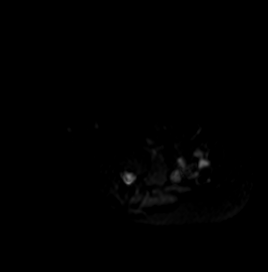
[im 36/72]
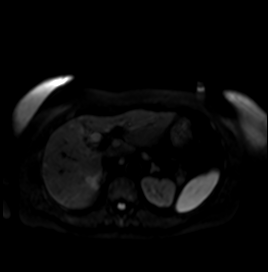
[im 72/72]
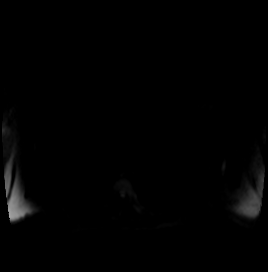

[Series 8: DWI · axial · 6.0mm · 1.36mm/px · 1 of 36 slices shown (2 of 2)]
[im 1/36]
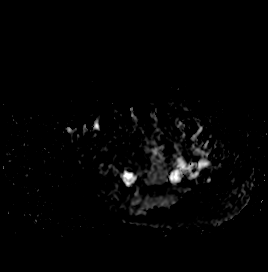

[Series 9: T1 · axial · 3.0mm · 1.11mm/px · z∈[-102,+135]mm · 3 of 80 slices shown (1 of 2)]
[im 1/80]
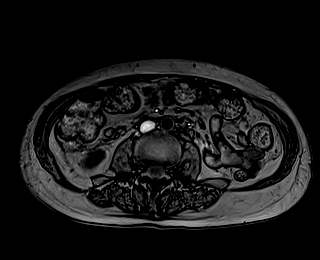
[im 40/80]
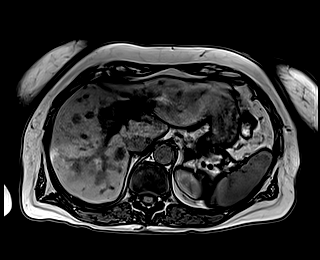
[im 80/80]
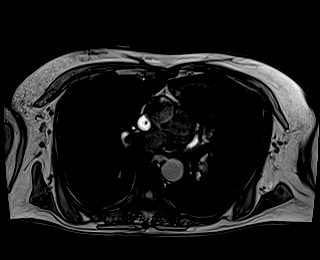

[Series 10: T1 · axial · 3.0mm · 1.11mm/px · z∈[-102,+135]mm · 3 of 80 slices shown (2 of 2)]
[im 1/80]
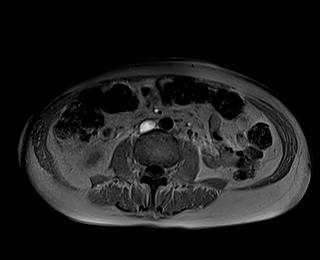
[im 40/80]
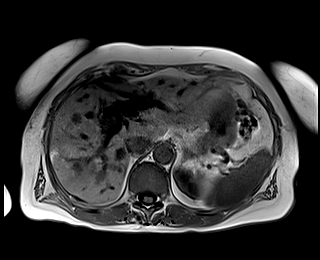
[im 80/80]
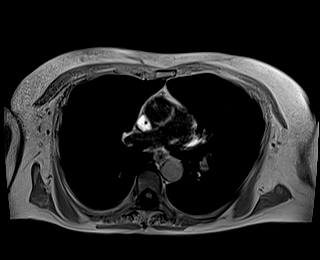

[Series 11: cor obl thk · sagittal · 50.0mm · 0.78mm/px · 1 of 8 slices shown]
[im 1/8]
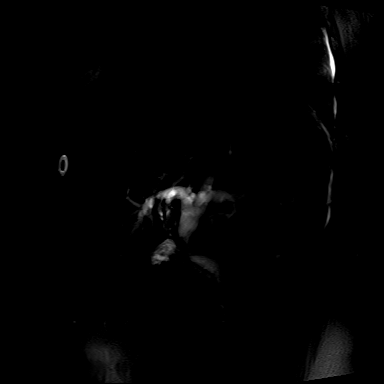

[Series 12: T2 · coronal · 6.0mm · 1.56mm/px · 1 of 30 slices shown (1 of 2)]
[im 1/30]
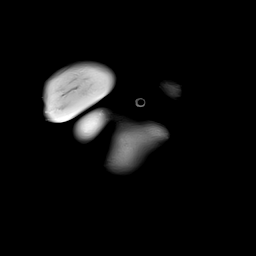

[Series 14: cor_3d_spc_trig · coronal · 1.1mm · 0.49mm/px · 3 of 72 slices shown]
[im 1/72]
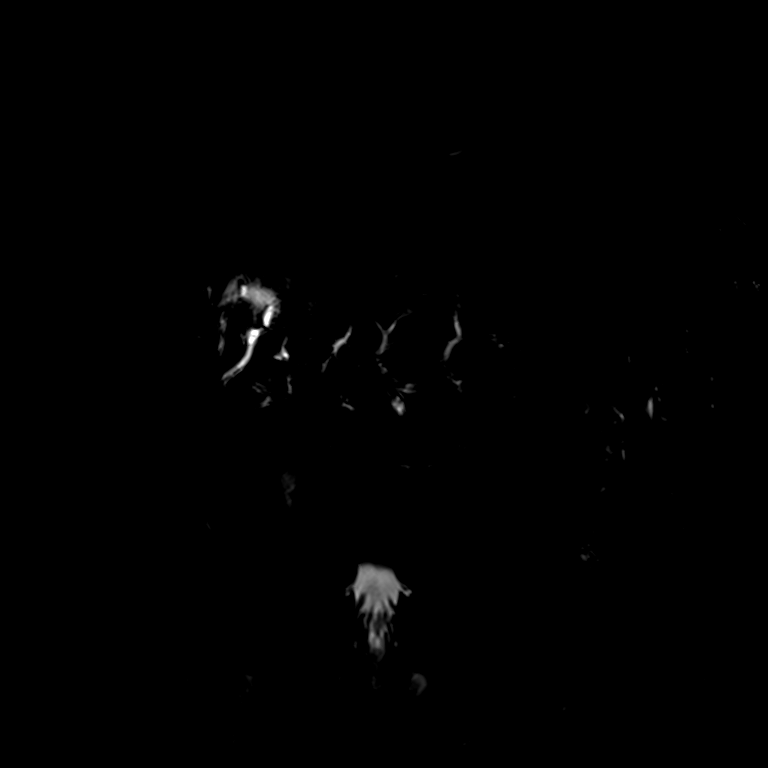
[im 36/72]
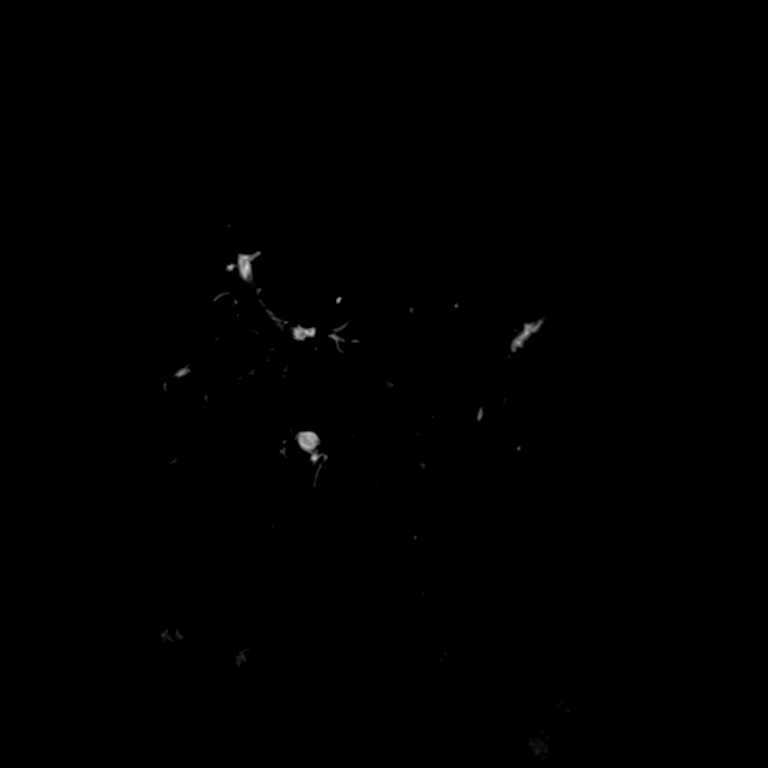
[im 72/72]
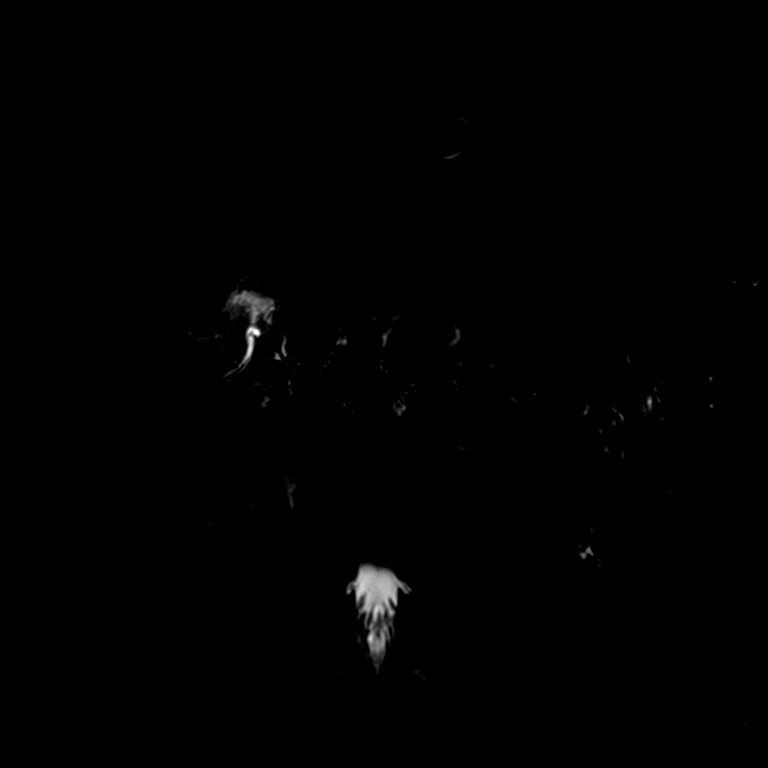

[Series 16: T2 · axial · 6.0mm · 1.33mm/px · 1 of 30 slices shown (2 of 2)]
[im 1/30]
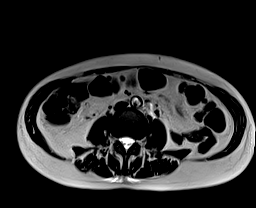

[Series 19: T1 dynamic · axial · 3.0mm · 1.10mm/px · z∈[-114,+147]mm · 3 of 88 slices shown (1 of 6)]
[im 1/88]
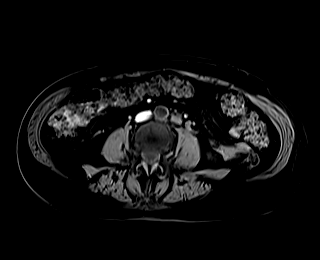
[im 44/88]
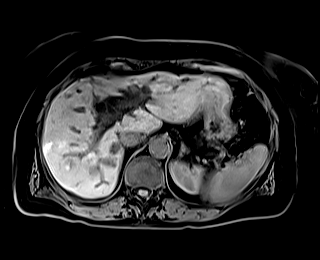
[im 88/88]
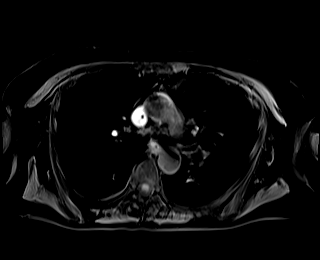

[Series 23: T1 dynamic · axial · 3.0mm · 1.10mm/px · z∈[-114,+147]mm · 3 of 88 slices shown (2 of 6)]
[im 1/88]
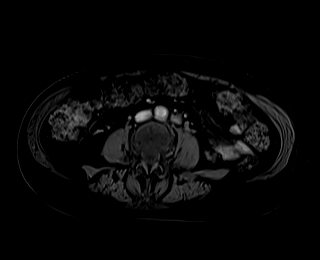
[im 44/88]
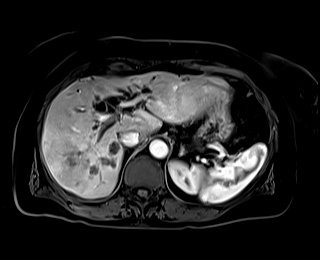
[im 88/88]
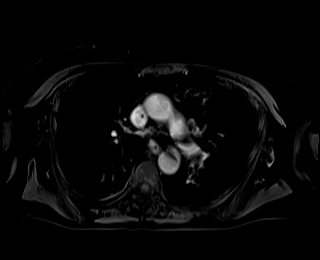

[Series 24: T1 dynamic · axial · 3.0mm · 1.10mm/px · z∈[-114,+147]mm · 3 of 88 slices shown (3 of 6)]
[im 1/88]
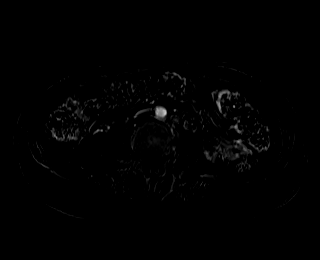
[im 44/88]
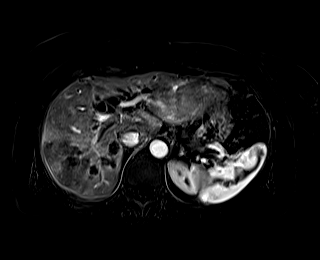
[im 88/88]
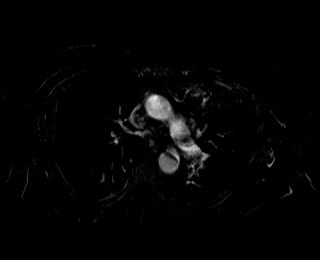

[Series 27: T1 dynamic · axial · 3.0mm · 1.10mm/px · z∈[-114,+147]mm · 3 of 88 slices shown (4 of 6)]
[im 1/88]
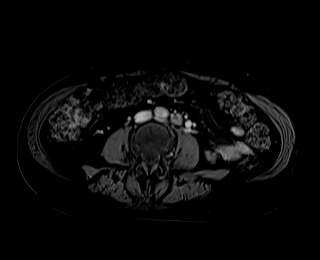
[im 44/88]
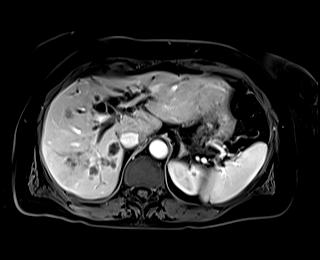
[im 88/88]
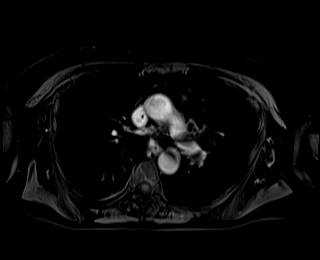

[Series 28: T1 dynamic · axial · 3.0mm · 1.10mm/px · z∈[-114,+147]mm · 3 of 88 slices shown (5 of 6)]
[im 1/88]
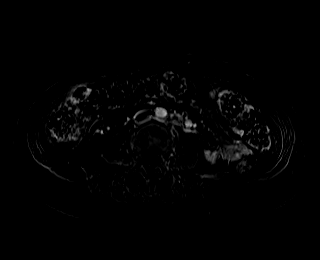
[im 44/88]
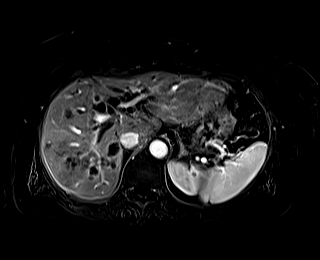
[im 88/88]
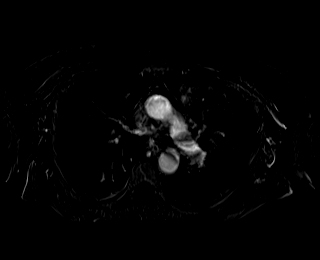

[Series 31: T1 dynamic · axial · 3.0mm · 1.10mm/px · z∈[-114,+15]mm · 2 of 88 slices shown (6 of 6)]
[im 1/88]
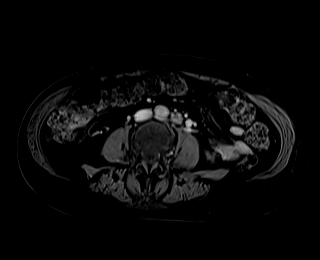
[im 44/88]
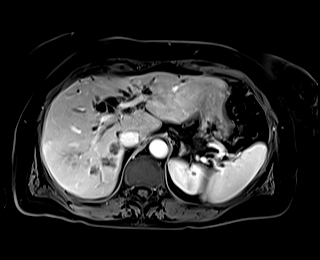

[34 of 48 positions shown; findings below may reference images not displayed]

FINDINGS: Lower chest: Atelectasis or infiltrate is noted at the lung bases.
There is redemonstration of a nodular lesion in the left lower lobe
measuring 8 mm. Trace bilateral pleural effusions are present.

Hepatobiliary: There is a heterogeneous ill-defined masslike lesion
with rim enhancement in the posterior right lobe of the liver
measuring 5.6 x 5.6 cm. Additional smaller T2 hyperintensities are
present in the right lobe of the liver with rim enhancement.
Intrahepatic and extrahepatic biliary ductal dilatation is noted.
The proximal common bile duct is distended measuring 1.7 cm in
diameter. There is segmental narrowing with smooth shouldering of
the mid common bile duct, which may be related to extrinsic
compression from lymphadenopathy. No enhancing mass is identified in
this region. The distal common bile duct is normal in caliber. No
choledocholithiasis. The gallbladder is contracted and contains T1
hyperintense material, possible sludge.

Pancreas: The pancreas enhances normally. The previously described
low-attenuation nodule in the body of the pancreas is not well seen
on this exam. No significant pancreatic ductal dilatation.

Spleen:  Within normal limits in size and appearance.

Adrenals/Urinary Tract: The adrenal glands are within normal limits.
No evidence of hydronephrosis. A few subcentimeter T2
hyperintensities are seen in the kidneys which are too small to
further characterize and may represent cysts.

Stomach/Bowel: No bowel obstruction. There is focal colonic wall
narrowing with wall thickening and enhancement involving the hepatic
flexure, which may represent patient's known mass. A moderate amount
of retained stool is present in the colon.

Vascular/Lymphatic: Multiple prominent lymph nodes are seen in the
gastrohepatic ligament, porta hepatis, and retroperitoneum. There
are nodules with restricted diffusion in the anterior peritoneum,
concerning for metastatic disease. The aorta is normal in caliber.
The splenic vein, portal vein, and superior mesenteric vein are
patent.

Other:  No free fluid.

Musculoskeletal: No suspicious bone lesions identified.
IMPRESSION: 1. Multiple rim enhancing lesions in the liver, the largest
measuring 5.6 x 5.6 cm, suggesting metastatic disease. Given history
of recent biopsy, correlation with biopsy results is recommended.
2. Intrahepatic and extrahepatic biliary ductal dilatation. There is
smooth shouldering of the mid common bile duct with segmental
narrowing which may be due to extrinsic mass effect from local
lymphadenopathy. No definite infiltrative mass is seen.
3. Enhancing mass at the colonic hepatic flexure, may represent
patient's known colon cancer.
4. Multiple enlarged lymph nodes in the porta hepatis, gastrohepatic
ligament, and retroperitoneum. There also nodules with restricted
diffusion in the anterior peritoneum, concerning for peritoneal
metastasis.
5. Left lower lobe pulmonary nodule, possible metastatic disease.

## 2021-01-19 MED ORDER — CHLORHEXIDINE GLUCONATE CLOTH 2 % EX PADS
6.0000 | MEDICATED_PAD | Freq: Every day | CUTANEOUS | Status: DC
  Administered 2021-01-19 – 2021-01-20 (×2): 6 via TOPICAL

## 2021-01-19 MED ORDER — MORPHINE SULFATE (PF) 2 MG/ML IV SOLN
1.0000 mg | INTRAVENOUS | Status: DC | PRN
  Administered 2021-01-19 (×3): 1 mg via INTRAVENOUS
  Filled 2021-01-19 (×2): qty 1

## 2021-01-19 MED ORDER — MORPHINE SULFATE (PF) 2 MG/ML IV SOLN
1.0000 mg | Freq: Once | INTRAVENOUS | Status: AC
  Administered 2021-01-19: 1 mg via INTRAVENOUS
  Filled 2021-01-19: qty 1

## 2021-01-19 MED ORDER — GADOBUTROL 1 MMOL/ML IV SOLN
7.0000 mL | Freq: Once | INTRAVENOUS | Status: AC | PRN
  Administered 2021-01-19: 7 mL via INTRAVENOUS

## 2021-01-19 MED ORDER — ATENOLOL 25 MG PO TABS
25.0000 mg | ORAL_TABLET | Freq: Every day | ORAL | Status: DC
  Administered 2021-01-19: 25 mg via ORAL
  Filled 2021-01-19: qty 1

## 2021-01-19 MED ORDER — GUAIFENESIN-DM 100-10 MG/5ML PO SYRP
5.0000 mL | ORAL_SOLUTION | ORAL | Status: DC | PRN
  Administered 2021-01-19 – 2021-01-20 (×3): 5 mL via ORAL
  Filled 2021-01-19 (×3): qty 10

## 2021-01-19 NOTE — H&P (Signed)
History and Physical    Berneda Piccininni NWG:[id number] DOB: 1945/04/02 DOA: 01/18/2021  PCP: Jene Every, MD  Patient coming from: Home.  Chief Complaint: Abdominal pain.  HPI: Norma Morales is a 75 y.o. female with history of palpitations and hyperlipidemia who was recently diagnosed with colonic mass underwent colonoscopy and biopsy about 10 days ago results of which are not available to me had underwent hepatic lesion biopsy 2 days ago presents to the ER because of worsening pain in the right upper quadrant.  Had some nausea vomiting denies any diarrhea.  Denies fever chills.  Patient has been having right upper quadrant pain for last few weeks but patient states pain is acutely worsened after the biopsy.  ED Course: In the ER patient is afebrile.  Labs reveal worsening of the LFTs when compared to recent past with AST of 246 ALT of 234 total bilirubin is normal at 0.8 alkaline phosphate is elevated at 443.  Lipase was normal lactic acid was normal WBC 13,000 hemoglobin 10.5.  CT abdomen pelvis was done which shows features concerning for narrowing of the CBD and intra and extrahepatic biliary duct dilatation.  Also shows features concerning for metastatic lesions and colon mass in the hepatic flexure.  ER physician discussed with Dr. Watt Climes on-call gastroenterologist who recommended getting MRI.  MRI was done shows features concerning for CBD narrowing with metastatic lesions compressing the CBD.  There were also metastatic lesions in the liver.  Patient admitted for further management.  Review of Systems: As per HPI, rest all negative.   Past Medical History:  Diagnosis Date   Cancer [facility])    colon   Hyperlipidemia     Past Surgical History:  Procedure Laterality Date   IR IMAGING GUIDED PORT INSERTION  01/17/2021   IR US GUIDE BX ASP/DRAIN  01/17/2021   TONSILLECTOMY       reports that she has never smoked. She has never used smokeless tobacco. She reports that she does not drink  alcohol and does not use drugs.  Allergies  Allergen Reactions   Aspirin Hives    History reviewed. No pertinent family history.  Prior to Admission medications   Medication Sig Start Date End Date Taking? Authorizing Provider  alendronate (FOSAMAX) 70 MG tablet TAKE 1 TABLET BY MOUTH EVERY SEVEN DAYS. 12/28/20   [provider]  ascorbic acid (VITAMIN C) 500 MG tablet Take by mouth.    [provider]  atenolol (TENORMIN) 25 MG tablet Take by mouth. 06/14/10   [provider]  b complex vitamins capsule Take 1 capsule by mouth daily.    [provider]  bisacodyl (DULCOLAX) 5 MG EC tablet Take by mouth. 01/02/21 02/01/21  [provider]  Calcium Carbonate (CALCIUM 600 PO) Take by mouth daily.    [provider]  Cholecalciferol 25 MCG (1000 UT) capsule Take by mouth.    [provider]  Coenzyme Q10 (COQ10 PO) Take 50 mg by mouth daily.    [provider]  ezetimibe (ZETIA) 10 MG tablet Take 1 tablet by mouth daily. 07/07/20   [provider]  Glucosamine-Chondroitin (GLUCOSAMINE CHONDR COMPLEX PO) Take by mouth daily.    [provider]  Omega-3 1000 MG CAPS Take by mouth.    [provider]  Probiotic Product (PROBIOTIC BLEND PO) Take by mouth daily.    [provider]  Resveratrol-Quercetin 100-100 MG TABS Take by mouth.    [provider]  Vitamin E 268 MG (  400 UNIT) CAPS Take by mouth daily.    [provider]  Zinc 50 MG TABS Take by mouth daily.    [provider]    Physical Exam: Constitutional: Moderately built and nourished. Vitals:   01/18/21 2200 01/18/21 2300 01/19/21 0000 01/19/21 0206  BP: (!) 134/57 (!) 105/54 (!) 114/50 (!) 119/57  Pulse: 85 76 78 80  Resp: 15 (!) 21 19 18   Temp:    97.7 F (36.5 C)  TempSrc:    Oral  SpO2: 96% 95% 94% 98%  Weight:      Height:       Eyes: Anicteric no pallor. ENMT: No discharge from the ears  eyes nose or mouth. Neck: No mass felt.  No neck rigidity. Respiratory: No rhonchi or crepitations. Cardiovascular: S1-S2 heard. Abdomen: Tenderness in the right upper quadrant no guarding or rigidity. Musculoskeletal: No edema. Skin: No rash. Neurologic: Alert awake oriented to time place and person.  Moves all extremities. Psychiatric: Appears normal.  Normal affect.   Labs on Admission: I have personally reviewed following labs and imaging studies  CBC: Recent Labs  Lab 01/17/21 1140 01/18/21 1529  WBC 14.0* 13.0*  NEUTROABS  --  10.6*  HGB 11.1* 10.5*  HCT 34.8* 32.7*  MCV 83.7 81.5  PLT 454* 193*   Basic Metabolic Panel: Recent Labs  Lab 01/17/21 1140 01/18/21 1529  NA 137 132*  K 4.1 4.0  CL 100 98  CO2 26 25  GLUCOSE 114* 119*  BUN 9 10  CREATININE 0.64 0.66  CALCIUM 9.2 9.0   GFR: Estimated Creatinine Clearance: 56.4 mL/min (by C-G formula based on SCr of 0.66 mg/dL). Liver Function Tests: Recent Labs  Lab 01/17/21 1140 01/18/21 1529  AST 201* 246*  ALT 187* 234*  ALKPHOS 404* 443*  BILITOT 0.9 0.8  PROT 7.7 7.1  ALBUMIN 3.6 3.0*   Recent Labs  Lab 01/18/21 1756  LIPASE 31   No results for input(s): AMMONIA in the last 168 hours. Coagulation Profile: Recent Labs  Lab 01/17/21 1140  INR 1.0   Cardiac Enzymes: No results for input(s): CKTOTAL, CKMB, CKMBINDEX, TROPONINI in the last 168 hours. BNP (last 3 results) No results for input(s): PROBNP in the last 8760 hours. HbA1C: No results for input(s): HGBA1C in the last 72 hours. CBG: No results for input(s): GLUCAP in the last 168 hours. Lipid Profile: No results for input(s): CHOL, HDL, LDLCALC, TRIG, CHOLHDL, LDLDIRECT in the last 72 hours. Thyroid Function Tests: No results for input(s): TSH, T4TOTAL, FREET4, T3FREE, THYROIDAB in the last 72 hours. Anemia Panel: No results for input(s): VITAMINB12, FOLATE, FERRITIN, TIBC, IRON, RETICCTPCT in the last 72 hours. Urine analysis:     Component Value Date/Time   COLORURINE YELLOW 01/18/2021 2000   APPEARANCEUR CLEAR 01/18/2021 2000   LABSPEC 1.021 01/18/2021 2000   PHURINE 5.0 01/18/2021 2000   GLUCOSEU NEGATIVE 01/18/2021 2000   HGBUR NEGATIVE 01/18/2021 2000   BILIRUBINUR NEGATIVE 01/18/2021 2000   KETONESUR 20 (A) 01/18/2021 2000   PROTEINUR NEGATIVE 01/18/2021 2000   NITRITE NEGATIVE 01/18/2021 2000   LEUKOCYTESUR TRACE (A) 01/18/2021 2000   Sepsis Labs: @LABRCNTIP (procalcitonin:4,lacticidven:4) ) Recent Results (from the past 240 hour(s))  Resp Panel by RT-PCR (Flu A&B, Covid) Nasopharyngeal Swab     Status: None   Collection Time: 01/18/21  4:53 PM   Specimen: Nasopharyngeal Swab; Nasopharyngeal(NP) swabs in vial transport medium  Result Value Ref Range Status   SARS Coronavirus 2 by RT PCR NEGATIVE NEGATIVE  Final    Comment: (NOTE) SARS-CoV-2 target nucleic acids are NOT DETECTED.  The SARS-CoV-2 RNA is generally detectable in upper respiratory specimens during the acute phase of infection. The lowest concentration of SARS-CoV-2 viral copies this assay can detect is 138 copies/mL. A negative result does not preclude SARS-Cov-2 infection and should not be used as the sole basis for treatment or other patient management decisions. A negative result may occur with  improper specimen collection/handling, submission of specimen other than nasopharyngeal swab, presence of viral mutation(s) within the areas targeted by this assay, and inadequate number of viral copies(<138 copies/mL). A negative result must be combined with clinical observations, patient history, and epidemiological information. The expected result is Negative.  Fact Sheet for Patients:  EntrepreneurPulse.com.au  Fact Sheet for Healthcare Providers:  IncredibleEmployment.be  This test is no t yet approved or cleared by the [place] FDA and  has been authorized for detection and/or diagnosis of  SARS-CoV-2 by FDA under an Emergency Use Authorization (EUA). This EUA will remain  in effect (meaning this test can be used) for the duration of the COVID-19 declaration under Section 564(b)(1) of the Act, 21 U.S.C.section 360bbb-3(b)(1), unless the authorization is terminated  or revoked sooner.       Influenza A by PCR NEGATIVE NEGATIVE Final   Influenza B by PCR NEGATIVE NEGATIVE Final    Comment: (NOTE) The Xpert Xpress SARS-CoV-2/FLU/RSV plus assay is intended as an aid in the diagnosis of influenza from Nasopharyngeal swab specimens and should not be used as a sole basis for treatment. Nasal washings and aspirates are unacceptable for Xpert Xpress SARS-CoV-2/FLU/RSV testing.  Fact Sheet for Patients: EntrepreneurPulse.com.au  Fact Sheet for Healthcare Providers: IncredibleEmployment.be  This test is not yet approved or cleared by the [place] FDA and has been authorized for detection and/or diagnosis of SARS-CoV-2 by FDA under an Emergency Use Authorization (EUA). This EUA will remain in effect (meaning this test can be used) for the duration of the COVID-19 declaration under Section 564(b)(1) of the Act, 21 U.S.C. section 360bbb-3(b)(1), unless the authorization is terminated or revoked.  Performed at [facility], [place] [address] [place], [place] [address]      Radiological Exams on Admission: CT Abdomen Pelvis W Contrast  Result Date: 01/18/2021 CLINICAL DATA:  Concern for abdominal abscess or infection. History of metastatic colon cancer. Status post core biopsy of right hepatic mass. EXAM: CT ABDOMEN AND PELVIS WITH CONTRAST TECHNIQUE: Multidetector CT imaging of the abdomen and pelvis was performed using the standard protocol following bolus administration of intravenous contrast. CONTRAST:  64mL OMNIPAQUE IOHEXOL 350 MG/ML SOLN COMPARISON:  None. FINDINGS: Lower chest: Bibasilar subpleural  atelectasis/scarring. A 7 mm nodular density in the left lower lobe most consistent with metastatic disease. No intra-abdominal free air.  Small free fluid in the pelvis. Hepatobiliary: Multiple hepatic hypodense lesions consistent with metastatic disease. There is a 5.3 x 5.1 cm hypoenhancing area in the right lobe of the liver with small pockets of air corresponding to the biopsied lesion. This likely represent an infiltrative mass or metastasis. An infectious process or developing abscess is less likely but not excluded. No drainable fluid collection identified. No calcified gallstone. There is dilatation of the intrahepatic and extrahepatic biliary tree. The common bile duct measures 17 mm in diameter. There is somewhat narrowing of the central CBD which is suboptimally evaluated on this CT but may be related to mass effect and compression by adjacent adenopathy. Further evaluation with MRI/MRCP is  recommended. Pancreas: There is a 12 x 10 mm low attenuating nodule in the body of the pancreas (31/2). Several additional low attenuating lesions in the region of the head of the pancreas noted which may represent peripancreatic adenopathy or metastatic disease. No active inflammatory changes of the pancreas. No gland atrophy or dilatation of the main pancreatic duct. Spleen: Normal in size without focal abnormality. Adrenals/Urinary Tract: The adrenal glands are unremarkable. The kidneys, visualized ureters, and urinary bladder appear unremarkable. Stomach/Bowel: There is sigmoid diverticulosis without active inflammatory changes. There is moderate stool throughout the colon. There is thickened and irregular appearance of the wall of the ascending colon in the region of the hepatic flexure likely representing known colonic mass/malignancy. There is no bowel obstruction. The appendix is normal. Vascular/Lymphatic: Mild aortoiliac atherosclerotic disease. The IVC is unremarkable. No portal venous gas. Retroperitoneal  adenopathy. Reproductive: The uterus is grossly unremarkable. Other: Scattered omental implants consistent with metastatic disease. The largest implant in the left lower quadrant measures approximately 17 x 20 mm (65/2). Musculoskeletal: Degenerative changes of the spine. No acute osseous pathology. IMPRESSION: 1. Thickened and irregular appearance of the wall of the ascending colon in the region of the hepatic flexure likely representing known colonic mass/malignancy. 2. Multiple hepatic hypodense lesions consistent with metastatic disease. The largest lesion in the right lobe of the liver has been biopsied. No drainable fluid collection/abscess. 3. Dilated intrahepatic and extrahepatic biliary tree with findings concerning for narrowing of the central CBD. Further evaluation with MRI/MRCP is recommended. 4. Scattered omental implants consistent with metastatic disease. 5. Left lower lobe pulmonary nodule/metastatic disease. 6. Sigmoid diverticulosis. No bowel obstruction. Normal appendix. 7. Aortic Atherosclerosis (ICD10-I70.0). Electronically Signed   By: Anner Crete M.D.   On: 01/18/2021 21:04   MR 3D Recon At Scanner  Result Date: 01/19/2021 CLINICAL DATA:  Right upper quadrant abdominal pain, nondiagnostic ultrasound. EXAM: MRI ABDOMEN WITHOUT AND WITH CONTRAST (INCLUDING MRCP) TECHNIQUE: Multiplanar multisequence MR imaging of the abdomen was performed both before and after the administration of intravenous contrast. Heavily T2-weighted images of the biliary and pancreatic ducts were obtained, and three-dimensional MRCP images were rendered by post processing. CONTRAST:  79mL GADAVIST GADOBUTROL 1 MMOL/ML IV SOLN COMPARISON:  01/18/2021. FINDINGS: Lower chest: Atelectasis or infiltrate is noted at the lung bases. There is redemonstration of a nodular lesion in the left lower lobe measuring 8 mm. Trace bilateral pleural effusions are present. Hepatobiliary: There is a heterogeneous ill-defined  masslike lesion with rim enhancement in the posterior right lobe of the liver measuring 5.6 x 5.6 cm. Additional smaller T2 hyperintensities are present in the right lobe of the liver with rim enhancement. Intrahepatic and extrahepatic biliary ductal dilatation is noted. The proximal common bile duct is distended measuring 1.7 cm in diameter. There is segmental narrowing with smooth shouldering of the mid common bile duct, which may be related to extrinsic compression from lymphadenopathy. No enhancing mass is identified in this region. The distal common bile duct is normal in caliber. No choledocholithiasis. The gallbladder is contracted and contains T1 hyperintense material, possible sludge. Pancreas: The pancreas enhances normally. The previously described low-attenuation nodule in the body of the pancreas is not well seen on this exam. No significant pancreatic ductal dilatation. Spleen:  Within normal limits in size and appearance. Adrenals/Urinary Tract: The adrenal glands are within normal limits. No evidence of hydronephrosis. A few subcentimeter T2 hyperintensities are seen in the kidneys which are too small to further characterize and may represent cysts. Stomach/Bowel:  No bowel obstruction. There is focal colonic wall narrowing with wall thickening and enhancement involving the hepatic flexure, which may represent patient's known mass. A moderate amount of retained stool is present in the colon. Vascular/Lymphatic: Multiple prominent lymph nodes are seen in the gastrohepatic ligament, porta hepatis, and retroperitoneum. There are nodules with restricted diffusion in the anterior peritoneum, concerning for metastatic disease. The aorta is normal in caliber. The splenic vein, portal vein, and superior mesenteric vein are patent. Other:  No free fluid. Musculoskeletal: No suspicious bone lesions identified. IMPRESSION: 1. Multiple rim enhancing lesions in the liver, the largest measuring 5.6 x 5.6 cm,  suggesting metastatic disease. Given history of recent biopsy, correlation with biopsy results is recommended. 2. Intrahepatic and extrahepatic biliary ductal dilatation. There is smooth shouldering of the mid common bile duct with segmental narrowing which may be due to extrinsic mass effect from local lymphadenopathy. No definite infiltrative mass is seen. 3. Enhancing mass at the colonic hepatic flexure, may represent patient's known colon cancer. 4. Multiple enlarged lymph nodes in the porta hepatis, gastrohepatic ligament, and retroperitoneum. There also nodules with restricted diffusion in the anterior peritoneum, concerning for peritoneal metastasis. 5. Left lower lobe pulmonary nodule, possible metastatic disease. Electronically Signed   By: Brett Fairy M.D.   On: 01/19/2021 04:22   IR US Guide Bx Asp/Drain  Result Date: 01/17/2021 INDICATION: HEPATIC METASTATIC DISEASE. EXAM: ULTRASOUND CORE BIOPSY RIGHT HEPATIC MASS MEDICATIONS: 1% LIDOCAINE ANESTHESIA/SEDATION: Moderate (conscious) sedation was employed during this procedure. A total of Versed 2.0 mg and Fentanyl 100 mcg was administered intravenously by the radiology nurse. Total intra-service moderate Sedation Time: 10 minutes. The patient's level of consciousness and vital signs were monitored continuously by radiology nursing throughout the procedure under my direct supervision. FLUOROSCOPY TIME:  Fluoroscopy Time: None. COMPLICATIONS: None immediate. PROCEDURE: Informed written consent was obtained from the patient after a thorough discussion of the procedural risks, benefits and alternatives. All questions were addressed. Maximal Sterile Barrier Technique was utilized including caps, mask, sterile gowns, sterile gloves, sterile drape, hand hygiene and skin antiseptic. A timeout was performed prior to the initiation of the procedure. Previous imaging reviewed. Preliminary ultrasound performed. Right hepatic lesion was localized and marked for  a mid axillary line approach through a lower intercostal space. Under sterile conditions and local anesthesia, a 17 gauge 11.8 cm guide was advanced to the lesion. Needle position confirmed with ultrasound. Images obtained for documentation. 18 gauge core biopsies obtained under direct ultrasound. Samples were intact and non fragmented. These were placed in formalin. Needle tract occluded with Gel-Foam. Postprocedure imaging demonstrates no hemorrhage or hematoma. Patient tolerated biopsy well. IMPRESSION: Successful ultrasound right hepatic mass 18 gauge core biopsy Electronically Signed   By: Jerilynn Mages.  Shick M.D.   On: 01/17/2021 14:56   MR ABDOMEN MRCP W WO CONTAST  Result Date: 01/19/2021 CLINICAL DATA:  Right upper quadrant abdominal pain, nondiagnostic ultrasound. EXAM: MRI ABDOMEN WITHOUT AND WITH CONTRAST (INCLUDING MRCP) TECHNIQUE: Multiplanar multisequence MR imaging of the abdomen was performed both before and after the administration of intravenous contrast. Heavily T2-weighted images of the biliary and pancreatic ducts were obtained, and three-dimensional MRCP images were rendered by post processing. CONTRAST:  44mL GADAVIST GADOBUTROL 1 MMOL/ML IV SOLN COMPARISON:  01/18/2021. FINDINGS: Lower chest: Atelectasis or infiltrate is noted at the lung bases. There is redemonstration of a nodular lesion in the left lower lobe measuring 8 mm. Trace bilateral pleural effusions are present. Hepatobiliary: There is a heterogeneous ill-defined masslike lesion  with rim enhancement in the posterior right lobe of the liver measuring 5.6 x 5.6 cm. Additional smaller T2 hyperintensities are present in the right lobe of the liver with rim enhancement. Intrahepatic and extrahepatic biliary ductal dilatation is noted. The proximal common bile duct is distended measuring 1.7 cm in diameter. There is segmental narrowing with smooth shouldering of the mid common bile duct, which may be related to extrinsic compression from  lymphadenopathy. No enhancing mass is identified in this region. The distal common bile duct is normal in caliber. No choledocholithiasis. The gallbladder is contracted and contains T1 hyperintense material, possible sludge. Pancreas: The pancreas enhances normally. The previously described low-attenuation nodule in the body of the pancreas is not well seen on this exam. No significant pancreatic ductal dilatation. Spleen:  Within normal limits in size and appearance. Adrenals/Urinary Tract: The adrenal glands are within normal limits. No evidence of hydronephrosis. A few subcentimeter T2 hyperintensities are seen in the kidneys which are too small to further characterize and may represent cysts. Stomach/Bowel: No bowel obstruction. There is focal colonic wall narrowing with wall thickening and enhancement involving the hepatic flexure, which may represent patient's known mass. A moderate amount of retained stool is present in the colon. Vascular/Lymphatic: Multiple prominent lymph nodes are seen in the gastrohepatic ligament, porta hepatis, and retroperitoneum. There are nodules with restricted diffusion in the anterior peritoneum, concerning for metastatic disease. The aorta is normal in caliber. The splenic vein, portal vein, and superior mesenteric vein are patent. Other:  No free fluid. Musculoskeletal: No suspicious bone lesions identified. IMPRESSION: 1. Multiple rim enhancing lesions in the liver, the largest measuring 5.6 x 5.6 cm, suggesting metastatic disease. Given history of recent biopsy, correlation with biopsy results is recommended. 2. Intrahepatic and extrahepatic biliary ductal dilatation. There is smooth shouldering of the mid common bile duct with segmental narrowing which may be due to extrinsic mass effect from local lymphadenopathy. No definite infiltrative mass is seen. 3. Enhancing mass at the colonic hepatic flexure, may represent patient's known colon cancer. 4. Multiple enlarged lymph  nodes in the porta hepatis, gastrohepatic ligament, and retroperitoneum. There also nodules with restricted diffusion in the anterior peritoneum, concerning for peritoneal metastasis. 5. Left lower lobe pulmonary nodule, possible metastatic disease. Electronically Signed   By: Brett Fairy M.D.   On: 01/19/2021 04:22   IR IMAGING GUIDED PORT INSERTION  Result Date: 01/17/2021 CLINICAL DATA:  Imaging findings consistent with metastatic colon cancer to liver EXAM: RIGHT INTERNAL JUGULAR SINGLE LUMEN POWER PORT CATHETER INSERTION Date:  01/17/2021 01/17/2021 2:30 pm Radiologist:  M. Daryll Brod, MD Guidance:  Ultrasound and fluoroscopic MEDICATIONS: 1% lidocaine local with epinephrine ANESTHESIA/SEDATION: Versed 2.0 mg IV; Fentanyl 100 mcg IV; Moderate Sedation Time:  23 minute The patient was continuously monitored during the procedure by the interventional radiology nurse under my direct supervision. FLUOROSCOPY TIME:  0 minutes, 36 seconds (1 mGy) COMPLICATIONS: None immediate. CONTRAST:  None. PROCEDURE: Informed consent was obtained from the patient following explanation of the procedure, risks, benefits and alternatives. The patient understands, agrees and consents for the procedure. All questions were addressed. A time out was performed. Maximal barrier sterile technique utilized including caps, mask, sterile gowns, sterile gloves, large sterile drape, hand hygiene, and 2% chlorhexidine scrub. Under sterile conditions and local anesthesia, right internal jugular micropuncture venous access was performed. Access was performed with ultrasound. Images were obtained for documentation of the patent right internal jugular vein. A guide wire was inserted followed by a transitional  dilator. This allowed insertion of a guide wire and catheter into the IVC. Measurements were obtained from the SVC / RA junction back to the right IJ venotomy site. In the right infraclavicular chest, a subcutaneous pocket was created  over the second anterior rib. This was done under sterile conditions and local anesthesia. 1% lidocaine with epinephrine was utilized for this. A 2.5 cm incision was made in the skin. Blunt dissection was performed to create a subcutaneous pocket over the right pectoralis major muscle. The pocket was flushed with saline vigorously. There was adequate hemostasis. The port catheter was assembled and checked for leakage. The port catheter was secured in the pocket with two retention sutures. The tubing was tunneled subcutaneously to the right venotomy site and inserted into the SVC/RA junction through a valved peel-away sheath. Position was confirmed with fluoroscopy. Images were obtained for documentation. The patient tolerated the procedure well. No immediate complications. Incisions were closed in a two layer fashion with 4 - 0 Vicryl suture. Dermabond was applied to the skin. The port catheter was accessed, blood was aspirated followed by saline and heparin flushes. Needle was removed. A dry sterile dressing was applied. IMPRESSION: Ultrasound and fluoroscopically guided right internal jugular single lumen power port catheter insertion. Tip in the SVC/RA junction. Catheter ready for use. Electronically Signed   By: Jerilynn Mages.  Shick M.D.   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  We will keep patient on clear liquids IV fluids pain relief medication and Dr. Watt Climes on-call gastroenterology has been consulted.  We will follow the recommendations. History of palpitations on atenolol. Anemia follow CBC check anemia panel with next blood  draw. History of hyperlipidemia. Leukocytosis could be likely secondary to reactionary.  No signs of infection.   DVT prophylaxis: SCDs.  Avoiding anticoagulation in anticipation of procedure. Code Status: Full code. Family Communication: Patient's husband at the bedside. Disposition Plan: Home. Consults called: ER physician discussed with Dr. Watt Climes gastroenterologist. Admission status: Observation.   Rise Patience MD [facility] Pager [phone].  If 7PM-7AM, please contact night-coverage [email or web address] Password [email or web address]  01/19/2021, 4:46 AM

## 2021-01-19 NOTE — ED Provider Notes (Signed)
Patient is a 75 year old female was seen at cancer center earlier today she has a history of colonic mass colonoscopy done at [facility]  CTAP 12/28/2020 was original nidus of concern for colon cancer.  She underwent liver biopsy and port placement yesterday states that she has had intermittent pain since 11/3 states that she had worse pain last night husband states fever at home  On my examination has some right upper quadrant tenderness is not guarding pain improved with morphine given by prior provider.   Abnormal CT scan  Results for orders placed or performed during the hospital encounter of 01/18/21  Resp Panel by RT-PCR (Flu A&B, Covid) Nasopharyngeal Swab   Specimen: Nasopharyngeal Swab; Nasopharyngeal(NP) swabs in vial transport medium  Result Value Ref Range   SARS Coronavirus 2 by RT PCR NEGATIVE NEGATIVE   Influenza A by PCR NEGATIVE NEGATIVE   Influenza B by PCR NEGATIVE NEGATIVE  Lipase, blood  Result Value Ref Range   Lipase 31 11 - 51 U/L  Urinalysis, Routine w reflex microscopic Urine, Clean Catch  Result Value Ref Range   Color, Urine YELLOW YELLOW   APPearance CLEAR CLEAR   Specific Gravity, Urine 1.021 1.005 - 1.030   pH 5.0 5.0 - 8.0   Glucose, UA NEGATIVE NEGATIVE mg/dL   Hgb urine dipstick NEGATIVE NEGATIVE   Bilirubin Urine NEGATIVE NEGATIVE   Ketones, ur 20 (A) NEGATIVE mg/dL   Protein, ur NEGATIVE NEGATIVE mg/dL   Nitrite NEGATIVE NEGATIVE   Leukocytes,Ua TRACE (A) NEGATIVE   RBC / HPF 0-5 0 - 5 RBC/hpf   WBC, UA 0-5 0 - 5 WBC/hpf   Bacteria, UA NONE SEEN NONE SEEN   Squamous Epithelial / LPF 0-5 0 - 5   Mucus PRESENT   Lactic acid, plasma  Result Value Ref Range   Lactic Acid, Venous 1.5 0.5 - 1.9 mmol/L  Lactic acid, plasma  Result Value Ref Range   Lactic Acid, Venous 1.5 0.5 - 1.9 mmol/L   CT Chest W Contrast  Result Date: 01/11/2021 CLINICAL DATA:  Staging for metastatic disease in the abdomen/pelvis EXAM: CT CHEST WITH CONTRAST  TECHNIQUE: Multidetector CT imaging of the chest was performed during intravenous contrast administration. CONTRAST:  52mL OMNIPAQUE IOHEXOL 350 MG/ML SOLN COMPARISON:  None. FINDINGS: Cardiovascular: No significant vascular findings. Normal heart size. No pericardial effusion. Mediastinum/Nodes: No enlarged mediastinal, hilar, or axillary lymph nodes. Thyroid gland, trachea, and esophagus demonstrate no significant findings. Lungs/Pleura: Trachea and central bronchi are patent. No endobronchial lesion. Biapical pleural/parenchymal scarring. 2 mm nodular density in the anterior aspect of the right upper lobe (status 7 image 47). 3 mm nodular density in the posterior right lower lobe (series 7 image 80). There is a 6 mm density in the medial aspect of the left lower lobe abutting the pleura (series 7 image 122). Another density on the same image measuring 1.0 x 0.3 cm. Another density in the posterolateral aspect of the left lower lobe (image 115) measuring approximately 0.8 x 0.9 cm. And other nodular density in the left lower lobe on image 92 measuring approximately 4 mm. Upper Abdomen: There is a ill-defined hypodense lesion in the right hepatic lobe on series 2 image 136, measures at least 5.5 x 4.0 cm. Few other small hypodense lesions in the liver. Musculoskeletal: No chest wall abnormality. No acute or significant osseous findings. IMPRESSION: 1. Multiple small pulmonary densities in the bilateral lower lobes, of indeterminate etiology, this may represent atelectasis or metastatic disease. Short-term follow-up  examination in 3-6 months is recommended. 2. Ill-defined hypodense lesion in the liver measuring at least 5.5 x 4.0 cm as well as few small hypodense lesions concerning for malignant process. 3.  No mediastinal lymphadenopathy. 4.  No suspicious osseous lesion. Electronically Signed   By: Keane Police D.O.   On: 01/11/2021 15:01   CT Abdomen Pelvis W Contrast  Result Date: 01/18/2021 CLINICAL DATA:   Concern for abdominal abscess or infection. History of metastatic colon cancer. Status post core biopsy of right hepatic mass. EXAM: CT ABDOMEN AND PELVIS WITH CONTRAST TECHNIQUE: Multidetector CT imaging of the abdomen and pelvis was performed using the standard protocol following bolus administration of intravenous contrast. CONTRAST:  24mL OMNIPAQUE IOHEXOL 350 MG/ML SOLN COMPARISON:  None. FINDINGS: Lower chest: Bibasilar subpleural atelectasis/scarring. A 7 mm nodular density in the left lower lobe most consistent with metastatic disease. No intra-abdominal free air.  Small free fluid in the pelvis. Hepatobiliary: Multiple hepatic hypodense lesions consistent with metastatic disease. There is a 5.3 x 5.1 cm hypoenhancing area in the right lobe of the liver with small pockets of air corresponding to the biopsied lesion. This likely represent an infiltrative mass or metastasis. An infectious process or developing abscess is less likely but not excluded. No drainable fluid collection identified. No calcified gallstone. There is dilatation of the intrahepatic and extrahepatic biliary tree. The common bile duct measures 17 mm in diameter. There is somewhat narrowing of the central CBD which is suboptimally evaluated on this CT but may be related to mass effect and compression by adjacent adenopathy. Further evaluation with MRI/MRCP is recommended. Pancreas: There is a 12 x 10 mm low attenuating nodule in the body of the pancreas (31/2). Several additional low attenuating lesions in the region of the head of the pancreas noted which may represent peripancreatic adenopathy or metastatic disease. No active inflammatory changes of the pancreas. No gland atrophy or dilatation of the main pancreatic duct. Spleen: Normal in size without focal abnormality. Adrenals/Urinary Tract: The adrenal glands are unremarkable. The kidneys, visualized ureters, and urinary bladder appear unremarkable. Stomach/Bowel: There is sigmoid  diverticulosis without active inflammatory changes. There is moderate stool throughout the colon. There is thickened and irregular appearance of the wall of the ascending colon in the region of the hepatic flexure likely representing known colonic mass/malignancy. There is no bowel obstruction. The appendix is normal. Vascular/Lymphatic: Mild aortoiliac atherosclerotic disease. The IVC is unremarkable. No portal venous gas. Retroperitoneal adenopathy. Reproductive: The uterus is grossly unremarkable. Other: Scattered omental implants consistent with metastatic disease. The largest implant in the left lower quadrant measures approximately 17 x 20 mm (65/2). Musculoskeletal: Degenerative changes of the spine. No acute osseous pathology. IMPRESSION: 1. Thickened and irregular appearance of the wall of the ascending colon in the region of the hepatic flexure likely representing known colonic mass/malignancy. 2. Multiple hepatic hypodense lesions consistent with metastatic disease. The largest lesion in the right lobe of the liver has been biopsied. No drainable fluid collection/abscess. 3. Dilated intrahepatic and extrahepatic biliary tree with findings concerning for narrowing of the central CBD. Further evaluation with MRI/MRCP is recommended. 4. Scattered omental implants consistent with metastatic disease. 5. Left lower lobe pulmonary nodule/metastatic disease. 6. Sigmoid diverticulosis. No bowel obstruction. Normal appendix. 7. Aortic Atherosclerosis (ICD10-I70.0). Electronically Signed   By: Anner Crete M.D.   On: 01/18/2021 21:04   MR SACRUM SI JOINTS W WO CONTRAST  Result Date: 01/10/2021 CLINICAL DATA:  Suspected bone lesion seen on prior CT examination  EXAM: MRI SACRUM WITH AND WITHOUT CONTRAST TECHNIQUE: Multiplanar multi-sequence MR imaging of the sacrum was performed without and with intravenous contrast. COMPARISON:  None. CT examination dated December 29, 2020 FINDINGS: Bones/Joint/Cartilage No  fracture or dislocation. Normal alignment. No joint effusion. No marrow signal abnormality. No SI joint widening or erosive changes. No subchondral reactive marrow changes. No SI joint effusion. There is a T1 hypointense, T2 hyperintense nonenhancing structure abutting the left dorsal root of S2 measuring approximately 1.3 x 1.6 x 1.7 cm, most consistent with a Tarlov cyst, a benign process. No focal lumbar spine abnormality. Degenerate disc disease of the L5-S1. Ligaments, Muscles and Tendons Muscles are normal. No muscle atrophy. No muscle edema. Piriformis muscles are normal bilaterally without signal abnormality. Soft tissue No fluid collection or hematoma. No soft tissue mass. Normal neurovascular bundles. Visualized pelvic viscera are unremarkable. IMPRESSION: 1.  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 2.  No suspicious osseous lesion. Electronically Signed   By: Keane Police D.O.   On: 01/10/2021 17:50   IR US Guide Bx Asp/Drain  Result Date: 01/17/2021 INDICATION: HEPATIC METASTATIC DISEASE. EXAM: ULTRASOUND CORE BIOPSY RIGHT HEPATIC MASS MEDICATIONS: 1% LIDOCAINE ANESTHESIA/SEDATION: Moderate (conscious) sedation was employed during this procedure. A total of Versed 2.0 mg and Fentanyl 100 mcg was administered intravenously by the radiology nurse. Total intra-service moderate Sedation Time: 10 minutes. The patient's level of consciousness and vital signs were monitored continuously by radiology nursing throughout the procedure under my direct supervision. FLUOROSCOPY TIME:  Fluoroscopy Time: None. COMPLICATIONS: None immediate. PROCEDURE: Informed written consent was obtained from the patient after a thorough discussion of the procedural risks, benefits and alternatives. All questions were addressed. Maximal Sterile Barrier Technique was utilized including caps, mask, sterile gowns, sterile gloves, sterile drape, hand hygiene and skin antiseptic. A timeout was performed prior to  the initiation of the procedure. Previous imaging reviewed. Preliminary ultrasound performed. Right hepatic lesion was localized and marked for a mid axillary line approach through a lower intercostal space. Under sterile conditions and local anesthesia, a 17 gauge 11.8 cm guide was advanced to the lesion. Needle position confirmed with ultrasound. Images obtained for documentation. 18 gauge core biopsies obtained under direct ultrasound. Samples were intact and non fragmented. These were placed in formalin. Needle tract occluded with Gel-Foam. Postprocedure imaging demonstrates no hemorrhage or hematoma. Patient tolerated biopsy well. IMPRESSION: Successful ultrasound right hepatic mass 18 gauge core biopsy Electronically Signed   By: Jerilynn Mages.  Shick M.D.   On: 01/17/2021 14:56   IR IMAGING GUIDED PORT INSERTION  Result Date: 01/17/2021 CLINICAL DATA:  Imaging findings consistent with metastatic colon cancer to liver EXAM: RIGHT INTERNAL JUGULAR SINGLE LUMEN POWER PORT CATHETER INSERTION Date:  01/17/2021 01/17/2021 2:30 pm Radiologist:  M. Daryll Brod, MD Guidance:  Ultrasound and fluoroscopic MEDICATIONS: 1% lidocaine local with epinephrine ANESTHESIA/SEDATION: Versed 2.0 mg IV; Fentanyl 100 mcg IV; Moderate Sedation Time:  23 minute The patient was continuously monitored during the procedure by the interventional radiology nurse under my direct supervision. FLUOROSCOPY TIME:  0 minutes, 36 seconds (1 mGy) COMPLICATIONS: None immediate. CONTRAST:  None. PROCEDURE: Informed consent was obtained from the patient following explanation of the procedure, risks, benefits and alternatives. The patient understands, agrees and consents for the procedure. All questions were addressed. A time out was performed. Maximal barrier sterile technique utilized including caps, mask, sterile gowns, sterile gloves, large sterile drape, hand hygiene, and 2% chlorhexidine scrub. Under sterile conditions and local anesthesia, right  internal jugular micropuncture venous access was performed. Access was performed with ultrasound. Images were obtained for documentation of the patent right internal jugular vein. A guide wire was inserted followed by a transitional dilator. This allowed insertion of a guide wire and catheter into the IVC. Measurements were obtained from the SVC / RA junction back to the right IJ venotomy site. In the right infraclavicular chest, a subcutaneous pocket was created over the second anterior rib. This was done under sterile conditions and local anesthesia. 1% lidocaine with epinephrine was utilized for this. A 2.5 cm incision was made in the skin. Blunt dissection was performed to create a subcutaneous pocket over the right pectoralis major muscle. The pocket was flushed with saline vigorously. There was adequate hemostasis. The port catheter was assembled and checked for leakage. The port catheter was secured in the pocket with two retention sutures. The tubing was tunneled subcutaneously to the right venotomy site and inserted into the SVC/RA junction through a valved peel-away sheath. Position was confirmed with fluoroscopy. Images were obtained for documentation. The patient tolerated the procedure well. No immediate complications. Incisions were closed in a two layer fashion with 4 - 0 Vicryl suture. Dermabond was applied to the skin. The port catheter was accessed, blood was aspirated followed by saline and heparin flushes. Needle was removed. A dry sterile dressing was applied. IMPRESSION: Ultrasound and fluoroscopically guided right internal jugular single lumen power port catheter insertion. Tip in the SVC/RA junction. Catheter ready for use. Electronically Signed   By: Jerilynn Mages.  Shick M.D.   On: 01/17/2021 14:53     Patient discussed with Dr. Watt Climes of [facility]  Plan to admit to medicine patient has stable leukocytosis is afebrile vital signs within normal limits no fever here in the ER.  Pain relatively well  controlled at this time.  May be related to liver biopsy versus biliary obstruction.  MRCP pending at this time.   Hospitalist Dr. Hal Hope will admit.    Pati Gallo [place], [place] 01/19/21 Alena Bills    Lacretia Leigh, MD 01/24/21 1520

## 2021-01-19 NOTE — ED Notes (Signed)
Pt transported to MRI 

## 2021-01-19 NOTE — Care Plan (Addendum)
This 75 years old female with PMH significant for hyperlipidemia who was recently diagnosed with colonic mass, underwent colonoscopy and biopsy about 10 days ago results of which are still not available, she had underwent liver biopsy 2 days ago presented in the ED with worsening pain in the right upper quadrant.  Patient also reported nausea,  vomiting but denies any diarrhea.  She is found to have elevated liver enzymes on labs.  CT abdomen showed features concerning for narrowing of CBD and intra and extra hepatic biliary duct dilatation.  MRCP consistent with liver lesions,  suspected of liver mets.  GI is consulted, states there is no need for ERCP or stent as liver enzymes are trending down.  Patient reports abdominal pain has improved.  Patient was seen and examined at bedside.  We will follow up formal GI consult.

## 2021-01-19 NOTE — Consult Note (Signed)
Referring Provider: [facility] Primary Care Physician:  Jene Every, MD Primary Gastroenterologist:  [facility]  Reason for Consultation: Possible colon cancer, abdominal pain status post liver biopsy  HPI: Norma Morales is a 75 y.o. female who was recently diagnosed with colon cancer on January 06, 2021.  Patient underwent colonoscopy at [facility] for evaluation of abdominal pain and abnormal CT scan concerning for thickening at hepatic flexure.  Colonoscopy showed circumferential mass in the transverse colon.  Biopsy showed Superficial fragments of colonic mucosa with extensive high-grade dysplasia. . There was also concern for liver mets.  Underwent ultrasound-guided liver biopsy on January 17, 2021.  Patient was seen by oncology yesterday and was advised to come to the emergency room because of abdominal pain and fever.  CT abdomen pelvis with contrast yesterday showed colon mass with known hepatic lesions.  It also showed dilated intra and extrahepatic biliary tree with narrowing of the central CBD.  MRI MRCP yesterday showed biliary ductal dilation with smooth narrowing of the common bile duct probably due to extrinsic compression from local lymphadenopathy.  Patient seen and examined at bedside.  She started noticing worsening abdominal pain 1 day after liver biopsy.  Denies any nausea or vomiting.  Denies any blood in the stool or black stool.  This abdominal pain was different from her previous abdominal pain.  Past Medical History:  Diagnosis Date   Cancer [facility])    colon   Hyperlipidemia     Past Surgical History:  Procedure Laterality Date   IR IMAGING GUIDED PORT INSERTION  01/17/2021   IR US GUIDE BX ASP/DRAIN  01/17/2021   TONSILLECTOMY      Prior to Admission medications   Medication Sig Start Date End Date Taking? Authorizing Provider  acetaminophen (TYLENOL) 500 MG tablet Take 500 mg by mouth every 6 (six) hours as needed for mild pain, fever or headache.   Yes [provider]   alendronate (FOSAMAX) 70 MG tablet Take 70 mg by mouth once a week. 12/28/20  Yes [provider]  ascorbic acid (VITAMIN C) 500 MG tablet Take 500 mg by mouth daily.   Yes [provider]  atenolol (TENORMIN) 25 MG tablet Take 25 mg by mouth daily. 06/14/10  Yes [provider]  b complex vitamins capsule Take 1 capsule by mouth daily.   Yes [provider]  Calcium Carbonate (CALCIUM 600 PO) Take 600 mg by mouth daily.   Yes [provider]  Cholecalciferol 25 MCG (1000 UT) capsule Take 1,000 Units by mouth daily.   Yes [provider]  Coenzyme Q10 (COQ10) 50 MG CAPS Take 50 mg by mouth daily.   Yes [provider]  ezetimibe (ZETIA) 10 MG tablet Take 10 mg by mouth daily. 07/07/20  Yes [provider]  Glucosamine-Chondroitin (GLUCOSAMINE CHONDR COMPLEX PO) Take 1 tablet by mouth daily.   Yes [provider]  Omega-3 1000 MG CAPS Take 1,000 mg by mouth daily.   Yes [provider]  Probiotic Product (PROBIOTIC BLEND PO) Take 1 capsule by mouth daily.   Yes [provider]  Resveratrol-Quercetin 100-100 MG TABS Take 100 mg by mouth daily.   Yes [provider]  Vitamin E 268 MG (400 UNIT) CAPS Take 400 Units by mouth daily.   Yes [provider]  Zinc 50 MG TABS Take 50 mg by mouth daily.   Yes [provider]    Scheduled Meds:  atenolol  25 mg Oral q1800   Continuous Infusions:  sodium chloride 125 mL/hr at 01/19/21 0413   PRN Meds:.morphine injection  Allergies as of 01/18/2021 - Review Complete 01/18/2021  Allergen Reaction Noted   Aspirin Hives 01/04/2021    History reviewed. No pertinent family history.  Social History   Socioeconomic History   Marital status: Married    Spouse name: Not on file   Number of children: Not on file   Years of education: Not on file   Highest education level: Not on file  Occupational History   Not on file  Tobacco  Use   Smoking status: Never   Smokeless tobacco: Never  Substance and Sexual Activity   Alcohol use: Never   Drug use: Never   Sexual activity: Not on file  Other Topics Concern   Not on file  Social History Narrative   Not on file   Social Determinants of Health   Financial Resource Strain: Not on file  Food Insecurity: Not on file  Transportation Needs: Not on file  Physical Activity: Not on file  Stress: Not on file  Social Connections: Not on file  Intimate Partner Violence: Not on file    Review of Systems: All negative except as stated above in HPI.  Physical Exam: Vital signs: Vitals:   01/19/21 0501 01/19/21 1004  BP: (!) 106/55 (!) 116/51  Pulse: 74 78  Resp: 18 18  Temp: 98.2 F (36.8 C) 99.3 F (37.4 C)  SpO2: 95% 94%     General:   Alert,  Well-developed, well-nourished, pleasant and cooperative in NAD HEENT : NS, AT, EOMI Lungs:  Clear throughout to auscultation.   No wheezes, crackles, or rhonchi. No acute distress. Heart:  Regular rate and rhythm; no murmurs, clicks, rubs,  or gallops. Abdomen: Right upper quadrant and epigastric tenderness to palpation, bowel sounds present, no peritoneal signs.  Abdomen is soft. Neuro : Alert and oriented x3 Psych : Mood and affect normal Rectal:  Deferred  GI:  Lab Results: Recent Labs    01/17/21 1140 01/18/21 1529 01/19/21 0536  WBC 14.0* 13.0* 8.3  HGB 11.1* 10.5* 9.2*  HCT 34.8* 32.7* 28.8*  PLT 454* 431* 327   BMET Recent Labs    01/17/21 1140 01/18/21 1529 01/19/21 0536  NA 137 132* 132*  K 4.1 4.0 3.6  CL 100 98 100  CO2 26 25 24   GLUCOSE 114* 119* 95  BUN 9 10 7*  CREATININE 0.64 0.66 0.36*  CALCIUM 9.2 9.0 8.1*   LFT Recent Labs    01/19/21 0536  PROT 6.0*  ALBUMIN 2.7*  AST 220*  ALT 188*  ALKPHOS 368*  BILITOT 2.0*  BILIDIR 1.2*  IBILI 0.8   PT/INR Recent Labs    01/17/21 1140  LABPROT 13.7  INR 1.0     Studies/Results: CT Abdomen Pelvis W Contrast  Result  Date: 01/18/2021 CLINICAL DATA:  Concern for abdominal abscess or infection. History of metastatic colon cancer. Status post core biopsy of right hepatic mass. EXAM: CT ABDOMEN AND PELVIS WITH CONTRAST TECHNIQUE: Multidetector CT imaging of the abdomen and pelvis was performed using the standard protocol following bolus administration of intravenous contrast. CONTRAST:  84mL OMNIPAQUE IOHEXOL 350 MG/ML SOLN COMPARISON:  None. FINDINGS: Lower chest: Bibasilar subpleural atelectasis/scarring. A 7 mm nodular density in the left lower lobe most consistent with metastatic disease. No intra-abdominal free air.  Small free fluid in the pelvis. Hepatobiliary: Multiple hepatic hypodense lesions consistent with metastatic disease. There is a 5.3 x 5.1 cm hypoenhancing area in  the right lobe of the liver with small pockets of air corresponding to the biopsied lesion. This likely represent an infiltrative mass or metastasis. An infectious process or developing abscess is less likely but not excluded. No drainable fluid collection identified. No calcified gallstone. There is dilatation of the intrahepatic and extrahepatic biliary tree. The common bile duct measures 17 mm in diameter. There is somewhat narrowing of the central CBD which is suboptimally evaluated on this CT but may be related to mass effect and compression by adjacent adenopathy. Further evaluation with MRI/MRCP is recommended. Pancreas: There is a 12 x 10 mm low attenuating nodule in the body of the pancreas (31/2). Several additional low attenuating lesions in the region of the head of the pancreas noted which may represent peripancreatic adenopathy or metastatic disease. No active inflammatory changes of the pancreas. No gland atrophy or dilatation of the main pancreatic duct. Spleen: Normal in size without focal abnormality. Adrenals/Urinary Tract: The adrenal glands are unremarkable. The kidneys, visualized ureters, and urinary bladder appear unremarkable.  Stomach/Bowel: There is sigmoid diverticulosis without active inflammatory changes. There is moderate stool throughout the colon. There is thickened and irregular appearance of the wall of the ascending colon in the region of the hepatic flexure likely representing known colonic mass/malignancy. There is no bowel obstruction. The appendix is normal. Vascular/Lymphatic: Mild aortoiliac atherosclerotic disease. The IVC is unremarkable. No portal venous gas. Retroperitoneal adenopathy. Reproductive: The uterus is grossly unremarkable. Other: Scattered omental implants consistent with metastatic disease. The largest implant in the left lower quadrant measures approximately 17 x 20 mm (65/2). Musculoskeletal: Degenerative changes of the spine. No acute osseous pathology. IMPRESSION: 1. Thickened and irregular appearance of the wall of the ascending colon in the region of the hepatic flexure likely representing known colonic mass/malignancy. 2. Multiple hepatic hypodense lesions consistent with metastatic disease. The largest lesion in the right lobe of the liver has been biopsied. No drainable fluid collection/abscess. 3. Dilated intrahepatic and extrahepatic biliary tree with findings concerning for narrowing of the central CBD. Further evaluation with MRI/MRCP is recommended. 4. Scattered omental implants consistent with metastatic disease. 5. Left lower lobe pulmonary nodule/metastatic disease. 6. Sigmoid diverticulosis. No bowel obstruction. Normal appendix. 7. Aortic Atherosclerosis (ICD10-I70.0). Electronically Signed   By: Anner Crete M.D.   On: 01/18/2021 21:04   MR 3D Recon At Scanner  Result Date: 01/19/2021 CLINICAL DATA:  Right upper quadrant abdominal pain, nondiagnostic ultrasound. EXAM: MRI ABDOMEN WITHOUT AND WITH CONTRAST (INCLUDING MRCP) TECHNIQUE: Multiplanar multisequence MR imaging of the abdomen was performed both before and after the administration of intravenous contrast. Heavily  T2-weighted images of the biliary and pancreatic ducts were obtained, and three-dimensional MRCP images were rendered by post processing. CONTRAST:  33mL GADAVIST GADOBUTROL 1 MMOL/ML IV SOLN COMPARISON:  01/18/2021. FINDINGS: Lower chest: Atelectasis or infiltrate is noted at the lung bases. There is redemonstration of a nodular lesion in the left lower lobe measuring 8 mm. Trace bilateral pleural effusions are present. Hepatobiliary: There is a heterogeneous ill-defined masslike lesion with rim enhancement in the posterior right lobe of the liver measuring 5.6 x 5.6 cm. Additional smaller T2 hyperintensities are present in the right lobe of the liver with rim enhancement. Intrahepatic and extrahepatic biliary ductal dilatation is noted. The proximal common bile duct is distended measuring 1.7 cm in diameter. There is segmental narrowing with smooth shouldering of the mid common bile duct, which may be related to extrinsic compression from lymphadenopathy. No enhancing mass is identified in this  region. The distal common bile duct is normal in caliber. No choledocholithiasis. The gallbladder is contracted and contains T1 hyperintense material, possible sludge. Pancreas: The pancreas enhances normally. The previously described low-attenuation nodule in the body of the pancreas is not well seen on this exam. No significant pancreatic ductal dilatation. Spleen:  Within normal limits in size and appearance. Adrenals/Urinary Tract: The adrenal glands are within normal limits. No evidence of hydronephrosis. A few subcentimeter T2 hyperintensities are seen in the kidneys which are too small to further characterize and may represent cysts. Stomach/Bowel: No bowel obstruction. There is focal colonic wall narrowing with wall thickening and enhancement involving the hepatic flexure, which may represent patient's known mass. A moderate amount of retained stool is present in the colon. Vascular/Lymphatic: Multiple prominent  lymph nodes are seen in the gastrohepatic ligament, porta hepatis, and retroperitoneum. There are nodules with restricted diffusion in the anterior peritoneum, concerning for metastatic disease. The aorta is normal in caliber. The splenic vein, portal vein, and superior mesenteric vein are patent. Other:  No free fluid. Musculoskeletal: No suspicious bone lesions identified. IMPRESSION: 1. Multiple rim enhancing lesions in the liver, the largest measuring 5.6 x 5.6 cm, suggesting metastatic disease. Given history of recent biopsy, correlation with biopsy results is recommended. 2. Intrahepatic and extrahepatic biliary ductal dilatation. There is smooth shouldering of the mid common bile duct with segmental narrowing which may be due to extrinsic mass effect from local lymphadenopathy. No definite infiltrative mass is seen. 3. Enhancing mass at the colonic hepatic flexure, may represent patient's known colon cancer. 4. Multiple enlarged lymph nodes in the porta hepatis, gastrohepatic ligament, and retroperitoneum. There also nodules with restricted diffusion in the anterior peritoneum, concerning for peritoneal metastasis. 5. Left lower lobe pulmonary nodule, possible metastatic disease. Electronically Signed   By: Brett Fairy M.D.   On: 01/19/2021 04:22   IR US Guide Bx Asp/Drain  Result Date: 01/17/2021 INDICATION: HEPATIC METASTATIC DISEASE. EXAM: ULTRASOUND CORE BIOPSY RIGHT HEPATIC MASS MEDICATIONS: 1% LIDOCAINE ANESTHESIA/SEDATION: Moderate (conscious) sedation was employed during this procedure. A total of Versed 2.0 mg and Fentanyl 100 mcg was administered intravenously by the radiology nurse. Total intra-service moderate Sedation Time: 10 minutes. The patient's level of consciousness and vital signs were monitored continuously by radiology nursing throughout the procedure under my direct supervision. FLUOROSCOPY TIME:  Fluoroscopy Time: None. COMPLICATIONS: None immediate. PROCEDURE: Informed written  consent was obtained from the patient after a thorough discussion of the procedural risks, benefits and alternatives. All questions were addressed. Maximal Sterile Barrier Technique was utilized including caps, mask, sterile gowns, sterile gloves, sterile drape, hand hygiene and skin antiseptic. A timeout was performed prior to the initiation of the procedure. Previous imaging reviewed. Preliminary ultrasound performed. Right hepatic lesion was localized and marked for a mid axillary line approach through a lower intercostal space. Under sterile conditions and local anesthesia, a 17 gauge 11.8 cm guide was advanced to the lesion. Needle position confirmed with ultrasound. Images obtained for documentation. 18 gauge core biopsies obtained under direct ultrasound. Samples were intact and non fragmented. These were placed in formalin. Needle tract occluded with Gel-Foam. Postprocedure imaging demonstrates no hemorrhage or hematoma. Patient tolerated biopsy well. IMPRESSION: Successful ultrasound right hepatic mass 18 gauge core biopsy Electronically Signed   By: Jerilynn Mages.  Shick M.D.   On: 01/17/2021 14:56   MR ABDOMEN MRCP W WO CONTAST  Result Date: 01/19/2021 CLINICAL DATA:  Right upper quadrant abdominal pain, nondiagnostic ultrasound. EXAM: MRI ABDOMEN WITHOUT AND WITH  CONTRAST (INCLUDING MRCP) TECHNIQUE: Multiplanar multisequence MR imaging of the abdomen was performed both before and after the administration of intravenous contrast. Heavily T2-weighted images of the biliary and pancreatic ducts were obtained, and three-dimensional MRCP images were rendered by post processing. CONTRAST:  14mL GADAVIST GADOBUTROL 1 MMOL/ML IV SOLN COMPARISON:  01/18/2021. FINDINGS: Lower chest: Atelectasis or infiltrate is noted at the lung bases. There is redemonstration of a nodular lesion in the left lower lobe measuring 8 mm. Trace bilateral pleural effusions are present. Hepatobiliary: There is a heterogeneous ill-defined  masslike lesion with rim enhancement in the posterior right lobe of the liver measuring 5.6 x 5.6 cm. Additional smaller T2 hyperintensities are present in the right lobe of the liver with rim enhancement. Intrahepatic and extrahepatic biliary ductal dilatation is noted. The proximal common bile duct is distended measuring 1.7 cm in diameter. There is segmental narrowing with smooth shouldering of the mid common bile duct, which may be related to extrinsic compression from lymphadenopathy. No enhancing mass is identified in this region. The distal common bile duct is normal in caliber. No choledocholithiasis. The gallbladder is contracted and contains T1 hyperintense material, possible sludge. Pancreas: The pancreas enhances normally. The previously described low-attenuation nodule in the body of the pancreas is not well seen on this exam. No significant pancreatic ductal dilatation. Spleen:  Within normal limits in size and appearance. Adrenals/Urinary Tract: The adrenal glands are within normal limits. No evidence of hydronephrosis. A few subcentimeter T2 hyperintensities are seen in the kidneys which are too small to further characterize and may represent cysts. Stomach/Bowel: No bowel obstruction. There is focal colonic wall narrowing with wall thickening and enhancement involving the hepatic flexure, which may represent patient's known mass. A moderate amount of retained stool is present in the colon. Vascular/Lymphatic: Multiple prominent lymph nodes are seen in the gastrohepatic ligament, porta hepatis, and retroperitoneum. There are nodules with restricted diffusion in the anterior peritoneum, concerning for metastatic disease. The aorta is normal in caliber. The splenic vein, portal vein, and superior mesenteric vein are patent. Other:  No free fluid. Musculoskeletal: No suspicious bone lesions identified. IMPRESSION: 1. Multiple rim enhancing lesions in the liver, the largest measuring 5.6 x 5.6 cm,  suggesting metastatic disease. Given history of recent biopsy, correlation with biopsy results is recommended. 2. Intrahepatic and extrahepatic biliary ductal dilatation. There is smooth shouldering of the mid common bile duct with segmental narrowing which may be due to extrinsic mass effect from local lymphadenopathy. No definite infiltrative mass is seen. 3. Enhancing mass at the colonic hepatic flexure, may represent patient's known colon cancer. 4. Multiple enlarged lymph nodes in the porta hepatis, gastrohepatic ligament, and retroperitoneum. There also nodules with restricted diffusion in the anterior peritoneum, concerning for peritoneal metastasis. 5. Left lower lobe pulmonary nodule, possible metastatic disease. Electronically Signed   By: Brett Fairy M.D.   On: 01/19/2021 04:22   IR IMAGING GUIDED PORT INSERTION  Result Date: 01/17/2021 CLINICAL DATA:  Imaging findings consistent with metastatic colon cancer to liver EXAM: RIGHT INTERNAL JUGULAR SINGLE LUMEN POWER PORT CATHETER INSERTION Date:  01/17/2021 01/17/2021 2:30 pm Radiologist:  M. Daryll Brod, MD Guidance:  Ultrasound and fluoroscopic MEDICATIONS: 1% lidocaine local with epinephrine ANESTHESIA/SEDATION: Versed 2.0 mg IV; Fentanyl 100 mcg IV; Moderate Sedation Time:  23 minute The patient was continuously monitored during the procedure by the interventional radiology nurse under my direct supervision. FLUOROSCOPY TIME:  0 minutes, 36 seconds (1 mGy) COMPLICATIONS: None immediate. CONTRAST:  None. PROCEDURE:  Informed consent was obtained from the patient following explanation of the procedure, risks, benefits and alternatives. The patient understands, agrees and consents for the procedure. All questions were addressed. A time out was performed. Maximal barrier sterile technique utilized including caps, mask, sterile gowns, sterile gloves, large sterile drape, hand hygiene, and 2% chlorhexidine scrub. Under sterile conditions and local  anesthesia, right internal jugular micropuncture venous access was performed. Access was performed with ultrasound. Images were obtained for documentation of the patent right internal jugular vein. A guide wire was inserted followed by a transitional dilator. This allowed insertion of a guide wire and catheter into the IVC. Measurements were obtained from the SVC / RA junction back to the right IJ venotomy site. In the right infraclavicular chest, a subcutaneous pocket was created over the second anterior rib. This was done under sterile conditions and local anesthesia. 1% lidocaine with epinephrine was utilized for this. A 2.5 cm incision was made in the skin. Blunt dissection was performed to create a subcutaneous pocket over the right pectoralis major muscle. The pocket was flushed with saline vigorously. There was adequate hemostasis. The port catheter was assembled and checked for leakage. The port catheter was secured in the pocket with two retention sutures. The tubing was tunneled subcutaneously to the right venotomy site and inserted into the SVC/RA junction through a valved peel-away sheath. Position was confirmed with fluoroscopy. Images were obtained for documentation. The patient tolerated the procedure well. No immediate complications. Incisions were closed in a two layer fashion with 4 - 0 Vicryl suture. Dermabond was applied to the skin. The port catheter was accessed, blood was aspirated followed by saline and heparin flushes. Needle was removed. A dry sterile dressing was applied. IMPRESSION: Ultrasound and fluoroscopically guided right internal jugular single lumen power port catheter insertion. Tip in the SVC/RA junction. Catheter ready for use. Electronically Signed   By: Jerilynn Mages.  Shick M.D.   On: 01/17/2021 14:53    Impression/Plan: -Possible metastatic colon cancer with liver mets.   -Abdominal pain and fever following liver biopsy.  CT scan negative for any acute changes except for biliary  ductal dilation. -Dilated biliary system with MRI MRCP showing smooth narrowing of the mid CBD from extrinsic compression from local lymphadenopathy.  Recommendations -------------------------- -Recommend supportive care for now.  Patient's leukocytosis has improved and resolved.  Only mildly elevated T bili at 2.0.  AST ALT stable.  -Recommend to wait for liver biopsy before scheduling ERCP.  We may consider early ERCP if she develops any signs of ascending cholangitis or if she develops signs of obstructive jaundice with total bilirubin more than 5.  -Case discussed with Dr. Watt Climes.   -GI will follow      LOS: 0 days   Otis Brace  MD, FACP 01/19/2021, 10:42 AM  Contact #  [phone]

## 2021-01-20 ENCOUNTER — Other Ambulatory Visit: Payer: Medicare HMO

## 2021-01-20 DIAGNOSIS — R1011 Right upper quadrant pain: Secondary | ICD-10-CM | POA: Diagnosis not present

## 2021-01-20 LAB — COMPREHENSIVE METABOLIC PANEL
ALT: 153 U/L — ABNORMAL HIGH (ref 0–44)
AST: 147 U/L — ABNORMAL HIGH (ref 15–41)
Albumin: 2.6 g/dL — ABNORMAL LOW (ref 3.5–5.0)
Alkaline Phosphatase: 357 U/L — ABNORMAL HIGH (ref 38–126)
Anion gap: 7 (ref 5–15)
BUN: <5 mg/dL — ABNORMAL LOW (ref 8–23)
CO2: 21 mmol/L — ABNORMAL LOW (ref 22–32)
Calcium: 8 mg/dL — ABNORMAL LOW (ref 8.9–10.3)
Chloride: 104 mmol/L (ref 98–111)
Creatinine, Ser: 0.5 mg/dL (ref 0.44–1.00)
GFR, Estimated: >60 mL/min (ref >60)
Glucose, Bld: 91 mg/dL (ref 70–99)
Potassium: 3.7 mmol/L (ref 3.5–5.1)
Sodium: 132 mmol/L — ABNORMAL LOW (ref 135–145)
Total Bilirubin: 1.4 mg/dL — ABNORMAL HIGH (ref 0.3–1.2)
Total Protein: 5.6 g/dL — ABNORMAL LOW (ref 6.5–8.1)

## 2021-01-20 LAB — CBC
HCT: 28.4 % — ABNORMAL LOW (ref 36.0–46.0)
Hemoglobin: 9 g/dL — ABNORMAL LOW (ref 12.0–15.0)
MCH: 26.5 pg (ref 26.0–34.0)
MCHC: 31.7 g/dL (ref 30.0–36.0)
MCV: 83.5 fL (ref 80.0–100.0)
Platelets: 322 10*3/uL (ref 150–400)
RBC: 3.4 MIL/uL — ABNORMAL LOW (ref 3.87–5.11)
RDW: 13.6 % (ref 11.5–15.5)
WBC: 9.5 10*3/uL (ref 4.0–10.5)
nRBC: 0 % (ref 0.0–0.2)

## 2021-01-20 LAB — MAGNESIUM: Magnesium: 2 mg/dL (ref 1.7–2.4)

## 2021-01-20 LAB — PHOSPHORUS: Phosphorus: 2.5 mg/dL (ref 2.5–4.6)

## 2021-01-20 MED ORDER — OXYCODONE HCL 5 MG PO TABS: 5.0000 mg | ORAL_TABLET | Freq: Three times a day (TID) | ORAL | 0 refills | Status: DC | PRN

## 2021-01-20 NOTE — Discharge Instructions (Signed)
Advised to follow up PCP in one week. Advised to follow up Oncologist in 2 days.

## 2021-01-20 NOTE — Discharge Summary (Signed)
Physician Discharge Summary  Norma Morales DGU:[id number] DOB: 06-Oct-1945 DOA: 01/18/2021  PCP: Jene Every, MD  Admit date: 01/18/2021  Discharge date: 01/20/2021  Admitted From: Home.  Disposition:  Home.  Recommendations for Outpatient Follow-up:  Follow up with PCP in 1-2 weeks. Please obtain CMP/CBC in one week. Advised to follow up Oncologist in 2 days.  Home Health: None Equipment/Devices:None  Discharge Condition: Stable CODE STATUS:Full code Diet recommendation: Heart Healthy  Brief Summary / Hospital Course: This 75 years old female with PMH significant for hyperlipidemia who was recently diagnosed with colonic mass, underwent colonoscopy and biopsy about 10 days ago results of which are still not available, She had undergone liver biopsy 2 days ago presented in the ED with worsening pain in the right upper quadrant.  Patient also reported nausea, vomiting but denies any diarrhea. She is found to have elevated liver enzymes on labs.  CT abdomen showed features concerning for narrowing of CBD and intra and extra hepatic biliary duct dilatation. MRCP consistent with liver lesions,  suspected of liver mets.  GI is consulted, states there is no need for ERCP or stent as liver enzymes are trending down.  Patient reports abdominal pain has improved.  Patient's labs has improved.  LFTs almost back to normal.  Patient's abdominal pain is resolved.  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  Biopsy result discussed with the patient and the family by gastroenterologist.  Patient feels better symptomatically and Patient is cleared from GI to be discharged.  Patient wants to be discharged.   She was managed for below problems   Discharge Diagnoses:  Principal Problem:   RUQ pain Active Problems:   Abdominal pain  RUQ pain could be secondary to liver mets: Patient was recently diagnosed with colonic mass underwent colonoscopy and biopsy 10 days ago,   results are still not available.  She also found to have liver lesions underwent liver biopsy 2 days ago. She has developed right upper quadrant pain postprocedure associated with elevated LFTs CT abdomen and MRCP consistent with liver lesion intra and extrahepatic biliary duct dilatation. Patient reports abdominal pain has improved with pain control. Continue supportive care.  Continue IV hydration and IV Zofran as needed. GI is consulted,  recommended to await liver biopsy before proceeding with ERCP or unless symptomatic. Liver biopsy result discussed with patient and family by GI. Patient is cleared from GI to be discharged patient feels better liver enzymes back to normal. Patient has appointment with oncologist Dr. Lorenso Courier on Monday.   Palpitations: Continue atenolol.   Hyperlipidemia: Not on any statins   Leukocytosis:  Could be reactive.  No signs of infection       Discharge Instructions  Discharge Instructions     Call MD for:  persistant dizziness or light-headedness   Complete by: As directed    Call MD for:  persistant nausea and vomiting   Complete by: As directed    Call MD for:  severe uncontrolled pain   Complete by: As directed    Diet - low sodium heart healthy   Complete by: As directed    Diet Carb Modified   Complete by: As directed    Discharge instructions   Complete by: As directed    Advised to follow up PCP in one week. Advised to follow up Oncologist in 2 days.   Increase activity slowly   Complete by: As directed       Allergies as of 01/20/2021  Reactions   Aspirin Hives        Medication List     STOP taking these medications    acetaminophen 500 MG tablet Commonly known as: TYLENOL       TAKE these medications    alendronate 70 MG tablet Commonly known as: FOSAMAX Take 70 mg by mouth once a week.   ascorbic acid 500 MG tablet Commonly known as: VITAMIN C Take 500 mg by mouth daily.   atenolol 25 MG  tablet Commonly known as: TENORMIN Take 25 mg by mouth daily.   b complex vitamins capsule Take 1 capsule by mouth daily.   CALCIUM 600 PO Take 600 mg by mouth daily.   Cholecalciferol 25 MCG (1000 UT) capsule Take 1,000 Units by mouth daily.   CoQ10 50 MG Caps Take 50 mg by mouth daily.   ezetimibe 10 MG tablet Commonly known as: ZETIA Take 10 mg by mouth daily.   GLUCOSAMINE CHONDR COMPLEX PO Take 1 tablet by mouth daily.   Omega-3 1000 MG Caps Take 1,000 mg by mouth daily.   oxyCODONE 5 MG immediate release tablet Commonly known as: Roxicodone Take 1 tablet (5 mg total) by mouth every 8 (eight) hours as needed.   PROBIOTIC BLEND PO Take 1 capsule by mouth daily.   Resveratrol-Quercetin 100-100 MG Tabs Take 100 mg by mouth daily.   Vitamin E 268 MG (400 UNIT) Caps Take 400 Units by mouth daily.   Zinc 50 MG Tabs Take 50 mg by mouth daily.        Follow-up Information     Jene Every, MD Follow up in 1 week(s).   Specialty: Family Medicine Contact information: [address] [place] [address] [place] [address] [phone]         Orson Slick, MD Follow up in 2 day(s).   Specialty: Hematology and Oncology Contact information: [place] [address] [phone]                Allergies  Allergen Reactions   Aspirin Hives    Consultations: GI.   Procedures/Studies: CT Chest W Contrast  Result Date: 01/11/2021 CLINICAL DATA:  Staging for metastatic disease in the abdomen/pelvis EXAM: CT CHEST WITH CONTRAST TECHNIQUE: Multidetector CT imaging of the chest was performed during intravenous contrast administration. CONTRAST:  60mL OMNIPAQUE IOHEXOL 350 MG/ML SOLN COMPARISON:  None. FINDINGS: Cardiovascular: No significant vascular findings. Normal heart size. No pericardial effusion. Mediastinum/Nodes: No enlarged mediastinal, hilar, or axillary lymph nodes. Thyroid gland, trachea, and esophagus demonstrate no  significant findings. Lungs/Pleura: Trachea and central bronchi are patent. No endobronchial lesion. Biapical pleural/parenchymal scarring. 2 mm nodular density in the anterior aspect of the right upper lobe (status 7 image 47). 3 mm nodular density in the posterior right lower lobe (series 7 image 80). There is a 6 mm density in the medial aspect of the left lower lobe abutting the pleura (series 7 image 122). Another density on the same image measuring 1.0 x 0.3 cm. Another density in the posterolateral aspect of the left lower lobe (image 115) measuring approximately 0.8 x 0.9 cm. And other nodular density in the left lower lobe on image 92 measuring approximately 4 mm. Upper Abdomen: There is a ill-defined hypodense lesion in the right hepatic lobe on series 2 image 136, measures at least 5.5 x 4.0 cm. Few other small hypodense lesions in the liver. Musculoskeletal: No chest wall abnormality. No acute or significant osseous findings. IMPRESSION:  1. Multiple small pulmonary densities in the bilateral lower lobes, of indeterminate etiology, this may represent atelectasis or metastatic disease. Short-term follow-up examination in 3-6 months is recommended. 2. Ill-defined hypodense lesion in the liver measuring at least 5.5 x 4.0 cm as well as few small hypodense lesions concerning for malignant process. 3.  No mediastinal lymphadenopathy. 4.  No suspicious osseous lesion. Electronically Signed   By: Keane Police D.O.   On: 01/11/2021 15:01   CT Abdomen Pelvis W Contrast  Result Date: 01/18/2021 CLINICAL DATA:  Concern for abdominal abscess or infection. History of metastatic colon cancer. Status post core biopsy of right hepatic mass. EXAM: CT ABDOMEN AND PELVIS WITH CONTRAST TECHNIQUE: Multidetector CT imaging of the abdomen and pelvis was performed using the standard protocol following bolus administration of intravenous contrast. CONTRAST:  67mL OMNIPAQUE IOHEXOL 350 MG/ML SOLN COMPARISON:  None. FINDINGS:  Lower chest: Bibasilar subpleural atelectasis/scarring. A 7 mm nodular density in the left lower lobe most consistent with metastatic disease. No intra-abdominal free air.  Small free fluid in the pelvis. Hepatobiliary: Multiple hepatic hypodense lesions consistent with metastatic disease. There is a 5.3 x 5.1 cm hypoenhancing area in the right lobe of the liver with small pockets of air corresponding to the biopsied lesion. This likely represent an infiltrative mass or metastasis. An infectious process or developing abscess is less likely but not excluded. No drainable fluid collection identified. No calcified gallstone. There is dilatation of the intrahepatic and extrahepatic biliary tree. The common bile duct measures 17 mm in diameter. There is somewhat narrowing of the central CBD which is suboptimally evaluated on this CT but may be related to mass effect and compression by adjacent adenopathy. Further evaluation with MRI/MRCP is recommended. Pancreas: There is a 12 x 10 mm low attenuating nodule in the body of the pancreas (31/2). Several additional low attenuating lesions in the region of the head of the pancreas noted which may represent peripancreatic adenopathy or metastatic disease. No active inflammatory changes of the pancreas. No gland atrophy or dilatation of the main pancreatic duct. Spleen: Normal in size without focal abnormality. Adrenals/Urinary Tract: The adrenal glands are unremarkable. The kidneys, visualized ureters, and urinary bladder appear unremarkable. Stomach/Bowel: There is sigmoid diverticulosis without active inflammatory changes. There is moderate stool throughout the colon. There is thickened and irregular appearance of the wall of the ascending colon in the region of the hepatic flexure likely representing known colonic mass/malignancy. There is no bowel obstruction. The appendix is normal. Vascular/Lymphatic: Mild aortoiliac atherosclerotic disease. The IVC is unremarkable. No  portal venous gas. Retroperitoneal adenopathy. Reproductive: The uterus is grossly unremarkable. Other: Scattered omental implants consistent with metastatic disease. The largest implant in the left lower quadrant measures approximately 17 x 20 mm (65/2). Musculoskeletal: Degenerative changes of the spine. No acute osseous pathology. IMPRESSION: 1. Thickened and irregular appearance of the wall of the ascending colon in the region of the hepatic flexure likely representing known colonic mass/malignancy. 2. Multiple hepatic hypodense lesions consistent with metastatic disease. The largest lesion in the right lobe of the liver has been biopsied. No drainable fluid collection/abscess. 3. Dilated intrahepatic and extrahepatic biliary tree with findings concerning for narrowing of the central CBD. Further evaluation with MRI/MRCP is recommended. 4. Scattered omental implants consistent with metastatic disease. 5. Left lower lobe pulmonary nodule/metastatic disease. 6. Sigmoid diverticulosis. No bowel obstruction. Normal appendix. 7. Aortic Atherosclerosis (ICD10-I70.0). Electronically Signed   By: Anner Crete M.D.   On: 01/18/2021 21:04  MR SACRUM SI JOINTS W WO CONTRAST  Result Date: 01/10/2021 CLINICAL DATA:  Suspected bone lesion seen on prior CT examination EXAM: MRI SACRUM WITH AND WITHOUT CONTRAST TECHNIQUE: Multiplanar multi-sequence MR imaging of the sacrum was performed without and with intravenous contrast. COMPARISON:  None. CT examination dated December 29, 2020 FINDINGS: Bones/Joint/Cartilage No fracture or dislocation. Normal alignment. No joint effusion. No marrow signal abnormality. No SI joint widening or erosive changes. No subchondral reactive marrow changes. No SI joint effusion. There is a T1 hypointense, T2 hyperintense nonenhancing structure abutting the left dorsal root of S2 measuring approximately 1.3 x 1.6 x 1.7 cm, most consistent with a Tarlov cyst, a benign process. No focal lumbar  spine abnormality. Degenerate disc disease of the L5-S1. Ligaments, Muscles and Tendons Muscles are normal. No muscle atrophy. No muscle edema. Piriformis muscles are normal bilaterally without signal abnormality. Soft tissue No fluid collection or hematoma. No soft tissue mass. Normal neurovascular bundles. Visualized pelvic viscera are unremarkable. IMPRESSION: 1.  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 2.  No suspicious osseous lesion. Electronically Signed   By: Keane Police D.O.   On: 01/10/2021 17:50   MR 3D Recon At Scanner  Result Date: 01/19/2021 CLINICAL DATA:  Right upper quadrant abdominal pain, nondiagnostic ultrasound. EXAM: MRI ABDOMEN WITHOUT AND WITH CONTRAST (INCLUDING MRCP) TECHNIQUE: Multiplanar multisequence MR imaging of the abdomen was performed both before and after the administration of intravenous contrast. Heavily T2-weighted images of the biliary and pancreatic ducts were obtained, and three-dimensional MRCP images were rendered by post processing. CONTRAST:  73mL GADAVIST GADOBUTROL 1 MMOL/ML IV SOLN COMPARISON:  01/18/2021. FINDINGS: Lower chest: Atelectasis or infiltrate is noted at the lung bases. There is redemonstration of a nodular lesion in the left lower lobe measuring 8 mm. Trace bilateral pleural effusions are present. Hepatobiliary: There is a heterogeneous ill-defined masslike lesion with rim enhancement in the posterior right lobe of the liver measuring 5.6 x 5.6 cm. Additional smaller T2 hyperintensities are present in the right lobe of the liver with rim enhancement. Intrahepatic and extrahepatic biliary ductal dilatation is noted. The proximal common bile duct is distended measuring 1.7 cm in diameter. There is segmental narrowing with smooth shouldering of the mid common bile duct, which may be related to extrinsic compression from lymphadenopathy. No enhancing mass is identified in this region. The distal common bile duct is normal in caliber.  No choledocholithiasis. The gallbladder is contracted and contains T1 hyperintense material, possible sludge. Pancreas: The pancreas enhances normally. The previously described low-attenuation nodule in the body of the pancreas is not well seen on this exam. No significant pancreatic ductal dilatation. Spleen:  Within normal limits in size and appearance. Adrenals/Urinary Tract: The adrenal glands are within normal limits. No evidence of hydronephrosis. A few subcentimeter T2 hyperintensities are seen in the kidneys which are too small to further characterize and may represent cysts. Stomach/Bowel: No bowel obstruction. There is focal colonic wall narrowing with wall thickening and enhancement involving the hepatic flexure, which may represent patient's known mass. A moderate amount of retained stool is present in the colon. Vascular/Lymphatic: Multiple prominent lymph nodes are seen in the gastrohepatic ligament, porta hepatis, and retroperitoneum. There are nodules with restricted diffusion in the anterior peritoneum, concerning for metastatic disease. The aorta is normal in caliber. The splenic vein, portal vein, and superior mesenteric vein are patent. Other:  No free fluid. Musculoskeletal: No suspicious bone lesions identified. IMPRESSION: 1. Multiple rim enhancing lesions  in the liver, the largest measuring 5.6 x 5.6 cm, suggesting metastatic disease. Given history of recent biopsy, correlation with biopsy results is recommended. 2. Intrahepatic and extrahepatic biliary ductal dilatation. There is smooth shouldering of the mid common bile duct with segmental narrowing which may be due to extrinsic mass effect from local lymphadenopathy. No definite infiltrative mass is seen. 3. Enhancing mass at the colonic hepatic flexure, may represent patient's known colon cancer. 4. Multiple enlarged lymph nodes in the porta hepatis, gastrohepatic ligament, and retroperitoneum. There also nodules with restricted diffusion  in the anterior peritoneum, concerning for peritoneal metastasis. 5. Left lower lobe pulmonary nodule, possible metastatic disease. Electronically Signed   By: Brett Fairy M.D.   On: 01/19/2021 04:22   IR US Guide Bx Asp/Drain  Result Date: 01/17/2021 INDICATION: HEPATIC METASTATIC DISEASE. EXAM: ULTRASOUND CORE BIOPSY RIGHT HEPATIC MASS MEDICATIONS: 1% LIDOCAINE ANESTHESIA/SEDATION: Moderate (conscious) sedation was employed during this procedure. A total of Versed 2.0 mg and Fentanyl 100 mcg was administered intravenously by the radiology nurse. Total intra-service moderate Sedation Time: 10 minutes. The patient's level of consciousness and vital signs were monitored continuously by radiology nursing throughout the procedure under my direct supervision. FLUOROSCOPY TIME:  Fluoroscopy Time: None. COMPLICATIONS: None immediate. PROCEDURE: Informed written consent was obtained from the patient after a thorough discussion of the procedural risks, benefits and alternatives. All questions were addressed. Maximal Sterile Barrier Technique was utilized including caps, mask, sterile gowns, sterile gloves, sterile drape, hand hygiene and skin antiseptic. A timeout was performed prior to the initiation of the procedure. Previous imaging reviewed. Preliminary ultrasound performed. Right hepatic lesion was localized and marked for a mid axillary line approach through a lower intercostal space. Under sterile conditions and local anesthesia, a 17 gauge 11.8 cm guide was advanced to the lesion. Needle position confirmed with ultrasound. Images obtained for documentation. 18 gauge core biopsies obtained under direct ultrasound. Samples were intact and non fragmented. These were placed in formalin. Needle tract occluded with Gel-Foam. Postprocedure imaging demonstrates no hemorrhage or hematoma. Patient tolerated biopsy well. IMPRESSION: Successful ultrasound right hepatic mass 18 gauge core biopsy Electronically Signed    By: Jerilynn Mages.  Shick M.D.   On: 01/17/2021 14:56   MR ABDOMEN MRCP W WO CONTAST  Result Date: 01/19/2021 CLINICAL DATA:  Right upper quadrant abdominal pain, nondiagnostic ultrasound. EXAM: MRI ABDOMEN WITHOUT AND WITH CONTRAST (INCLUDING MRCP) TECHNIQUE: Multiplanar multisequence MR imaging of the abdomen was performed both before and after the administration of intravenous contrast. Heavily T2-weighted images of the biliary and pancreatic ducts were obtained, and three-dimensional MRCP images were rendered by post processing. CONTRAST:  16mL GADAVIST GADOBUTROL 1 MMOL/ML IV SOLN COMPARISON:  01/18/2021. FINDINGS: Lower chest: Atelectasis or infiltrate is noted at the lung bases. There is redemonstration of a nodular lesion in the left lower lobe measuring 8 mm. Trace bilateral pleural effusions are present. Hepatobiliary: There is a heterogeneous ill-defined masslike lesion with rim enhancement in the posterior right lobe of the liver measuring 5.6 x 5.6 cm. Additional smaller T2 hyperintensities are present in the right lobe of the liver with rim enhancement. Intrahepatic and extrahepatic biliary ductal dilatation is noted. The proximal common bile duct is distended measuring 1.7 cm in diameter. There is segmental narrowing with smooth shouldering of the mid common bile duct, which may be related to extrinsic compression from lymphadenopathy. No enhancing mass is identified in this region. The distal common bile duct is normal in caliber. No choledocholithiasis. The gallbladder is contracted and  contains T1 hyperintense material, possible sludge. Pancreas: The pancreas enhances normally. The previously described low-attenuation nodule in the body of the pancreas is not well seen on this exam. No significant pancreatic ductal dilatation. Spleen:  Within normal limits in size and appearance. Adrenals/Urinary Tract: The adrenal glands are within normal limits. No evidence of hydronephrosis. A few subcentimeter T2  hyperintensities are seen in the kidneys which are too small to further characterize and may represent cysts. Stomach/Bowel: No bowel obstruction. There is focal colonic wall narrowing with wall thickening and enhancement involving the hepatic flexure, which may represent patient's known mass. A moderate amount of retained stool is present in the colon. Vascular/Lymphatic: Multiple prominent lymph nodes are seen in the gastrohepatic ligament, porta hepatis, and retroperitoneum. There are nodules with restricted diffusion in the anterior peritoneum, concerning for metastatic disease. The aorta is normal in caliber. The splenic vein, portal vein, and superior mesenteric vein are patent. Other:  No free fluid. Musculoskeletal: No suspicious bone lesions identified. IMPRESSION: 1. Multiple rim enhancing lesions in the liver, the largest measuring 5.6 x 5.6 cm, suggesting metastatic disease. Given history of recent biopsy, correlation with biopsy results is recommended. 2. Intrahepatic and extrahepatic biliary ductal dilatation. There is smooth shouldering of the mid common bile duct with segmental narrowing which may be due to extrinsic mass effect from local lymphadenopathy. No definite infiltrative mass is seen. 3. Enhancing mass at the colonic hepatic flexure, may represent patient's known colon cancer. 4. Multiple enlarged lymph nodes in the porta hepatis, gastrohepatic ligament, and retroperitoneum. There also nodules with restricted diffusion in the anterior peritoneum, concerning for peritoneal metastasis. 5. Left lower lobe pulmonary nodule, possible metastatic disease. Electronically Signed   By: Brett Fairy M.D.   On: 01/19/2021 04:22   IR IMAGING GUIDED PORT INSERTION  Result Date: 01/17/2021 CLINICAL DATA:  Imaging findings consistent with metastatic colon cancer to liver EXAM: RIGHT INTERNAL JUGULAR SINGLE LUMEN POWER PORT CATHETER INSERTION Date:  01/17/2021 01/17/2021 2:30 pm Radiologist:  M.  Daryll Brod, MD Guidance:  Ultrasound and fluoroscopic MEDICATIONS: 1% lidocaine local with epinephrine ANESTHESIA/SEDATION: Versed 2.0 mg IV; Fentanyl 100 mcg IV; Moderate Sedation Time:  23 minute The patient was continuously monitored during the procedure by the interventional radiology nurse under my direct supervision. FLUOROSCOPY TIME:  0 minutes, 36 seconds (1 mGy) COMPLICATIONS: None immediate. CONTRAST:  None. PROCEDURE: Informed consent was obtained from the patient following explanation of the procedure, risks, benefits and alternatives. The patient understands, agrees and consents for the procedure. All questions were addressed. A time out was performed. Maximal barrier sterile technique utilized including caps, mask, sterile gowns, sterile gloves, large sterile drape, hand hygiene, and 2% chlorhexidine scrub. Under sterile conditions and local anesthesia, right internal jugular micropuncture venous access was performed. Access was performed with ultrasound. Images were obtained for documentation of the patent right internal jugular vein. A guide wire was inserted followed by a transitional dilator. This allowed insertion of a guide wire and catheter into the IVC. Measurements were obtained from the SVC / RA junction back to the right IJ venotomy site. In the right infraclavicular chest, a subcutaneous pocket was created over the second anterior rib. This was done under sterile conditions and local anesthesia. 1% lidocaine with epinephrine was utilized for this. A 2.5 cm incision was made in the skin. Blunt dissection was performed to create a subcutaneous pocket over the right pectoralis major muscle. The pocket was flushed with saline vigorously. There was adequate hemostasis. The port  catheter was assembled and checked for leakage. The port catheter was secured in the pocket with two retention sutures. The tubing was tunneled subcutaneously to the right venotomy site and inserted into the SVC/RA  junction through a valved peel-away sheath. Position was confirmed with fluoroscopy. Images were obtained for documentation. The patient tolerated the procedure well. No immediate complications. Incisions were closed in a two layer fashion with 4 - 0 Vicryl suture. Dermabond was applied to the skin. The port catheter was accessed, blood was aspirated followed by saline and heparin flushes. Needle was removed. A dry sterile dressing was applied. IMPRESSION: Ultrasound and fluoroscopically guided right internal jugular single lumen power port catheter insertion. Tip in the SVC/RA junction. Catheter ready for use. Electronically Signed   By: Jerilynn Mages.  Shick M.D.   On: 01/17/2021 14:53     Subjective: Patient was seen and examined at bedside.  Overnight events noted.  Patient reports feeling much improved. She wants to be discharged.  Patient being discharged home  Discharge Exam: Vitals:   01/19/21 2017 01/20/21 0523  BP: 121/78 121/60  Pulse: 80 83  Resp: 20 18  Temp: 98.5 F (36.9 C) 98.3 F (36.8 C)  SpO2: 94% 95%   Vitals:   01/19/21 1004 01/19/21 1824 01/19/21 2017 01/20/21 0523  BP: (!) 116/51 (!) 131/59 121/78 121/60  Pulse: 78 79 80 83  Resp: 18 15 20 18   Temp: 99.3 F (37.4 C) 97.9 F (36.6 C) 98.5 F (36.9 C) 98.3 F (36.8 C)  TempSrc: Oral Oral Oral Oral  SpO2: 94% (!) 89% 94% 95%  Weight:      Height:        General: Pt is alert, awake, not in acute distress Cardiovascular: RRR, S1/S2 +, no rubs, no gallops Respiratory: CTA bilaterally, no wheezing, no rhonchi Abdominal: Soft, NT, ND, bowel sounds + Extremities: no edema, no cyanosis    The results of significant diagnostics from this hospitalization (including imaging, microbiology, ancillary and laboratory) are listed below for reference.     Microbiology: Recent Results (from the past 240 hour(s))  Resp Panel by RT-PCR (Flu A&B, Covid) Nasopharyngeal Swab     Status: None   Collection Time: 01/18/21  4:53 PM    Specimen: Nasopharyngeal Swab; Nasopharyngeal(NP) swabs in vial transport medium  Result Value Ref Range Status   SARS Coronavirus 2 by RT PCR NEGATIVE NEGATIVE Final    Comment: (NOTE) SARS-CoV-2 target nucleic acids are NOT DETECTED.  The SARS-CoV-2 RNA is generally detectable in upper respiratory specimens during the acute phase of infection. The lowest concentration of SARS-CoV-2 viral copies this assay can detect is 138 copies/mL. A negative result does not preclude SARS-Cov-2 infection and should not be used as the sole basis for treatment or other patient management decisions. A negative result may occur with  improper specimen collection/handling, submission of specimen other than nasopharyngeal swab, presence of viral mutation(s) within the areas targeted by this assay, and inadequate number of viral copies(<138 copies/mL). A negative result must be combined with clinical observations, patient history, and epidemiological information. The expected result is Negative.  Fact Sheet for Patients:  EntrepreneurPulse.com.au  Fact Sheet for Healthcare Providers:  IncredibleEmployment.be  This test is no t yet approved or cleared by the [place] FDA and  has been authorized for detection and/or diagnosis of SARS-CoV-2 by FDA under an Emergency Use Authorization (EUA). This EUA will remain  in effect (meaning this test can be used) for the duration of the COVID-19 declaration  under Section 564(b)(1) of the Act, 21 U.S.C.section 360bbb-3(b)(1), unless the authorization is terminated  or revoked sooner.       Influenza A by PCR NEGATIVE NEGATIVE Final   Influenza B by PCR NEGATIVE NEGATIVE Final    Comment: (NOTE) The Xpert Xpress SARS-CoV-2/FLU/RSV plus assay is intended as an aid in the diagnosis of influenza from Nasopharyngeal swab specimens and should not be used as a sole basis for treatment. Nasal washings and aspirates are  unacceptable for Xpert Xpress SARS-CoV-2/FLU/RSV testing.  Fact Sheet for Patients: EntrepreneurPulse.com.au  Fact Sheet for Healthcare Providers: IncredibleEmployment.be  This test is not yet approved or cleared by the [place] FDA and has been authorized for detection and/or diagnosis of SARS-CoV-2 by FDA under an Emergency Use Authorization (EUA). This EUA will remain in effect (meaning this test can be used) for the duration of the COVID-19 declaration under Section 564(b)(1) of the Act, 21 U.S.C. section 360bbb-3(b)(1), unless the authorization is terminated or revoked.  Performed at [facility], [place] [address], [place], [place] [address]   Culture, blood (routine x 2)     Status: None (Preliminary result)   Collection Time: 01/18/21  5:37 PM   Specimen: Left Antecubital; Blood  Result Value Ref Range Status   Specimen Description   Final    LEFT ANTECUBITAL Performed at [facility] [address], [place], [place] [address]    Special Requests   Final    BOTTLES DRAWN AEROBIC AND ANAEROBIC Blood Culture adequate volume Performed at [facility] [address] [place], [place] [address]    Culture   Final    NO GROWTH < 24 HOURS Performed at [facility] [address] [place], [place] [address]    Report Status PENDING  Incomplete  Culture, blood (routine x 2)     Status: None (Preliminary result)   Collection Time: 01/18/21  6:09 PM   Specimen: Left Antecubital; Blood  Result Value Ref Range Status   Specimen Description   Final    LEFT ANTECUBITAL Performed at [facility] [address], [employer], [place] [address]    Special Requests   Final    BOTTLES DRAWN AEROBIC AND ANAEROBIC Blood Culture adequate volume Performed at [facility] [address], [place], [place] [address]    Culture   Final    NO GROWTH <  24 HOURS Performed at [facility] [address], [place], [place] [address]    Report Status PENDING  Incomplete     Labs: BNP (last 3 results) No results for input(s): BNP in the last 8760 hours. Basic Metabolic Panel: Recent Labs  Lab 01/17/21 1140 01/18/21 1529 01/19/21 0536 01/20/21 0858  NA 137 132* 132* 132*  K 4.1 4.0 3.6 3.7  CL 100 98 100 104  CO2 26 25 24  21*  GLUCOSE 114* 119* 95 91  BUN 9 10 7* <5*  CREATININE 0.64 0.66 0.36* 0.50  CALCIUM 9.2 9.0 8.1* 8.0*  MG  --   --   --  2.0  PHOS  --   --   --  2.5   Liver Function Tests: Recent Labs  Lab 01/17/21 1140 01/18/21 1529 01/19/21 0536 01/20/21 0858  AST 201* 246* 220* 147*  ALT 187* 234* 188* 153*  ALKPHOS 404* 443* 368* 357*  BILITOT 0.9 0.8 2.0* 1.4*  PROT 7.7 7.1 6.0* 5.6*  ALBUMIN 3.6 3.0* 2.7* 2.6*  Recent Labs  Lab 01/18/21 1756  LIPASE 31   No results for input(s): AMMONIA in the last 168 hours. CBC: Recent Labs  Lab 01/17/21 1140 01/18/21 1529 01/19/21 0536 01/20/21 0858  WBC 14.0* 13.0* 8.3 9.5  NEUTROABS  --  10.6* 6.3  --   HGB 11.1* 10.5* 9.2* 9.0*  HCT 34.8* 32.7* 28.8* 28.4*  MCV 83.7 81.5 83.5 83.5  PLT 454* 431* 327 322   Cardiac Enzymes: No results for input(s): CKTOTAL, CKMB, CKMBINDEX, TROPONINI in the last 168 hours. BNP: Invalid input(s): POCBNP CBG: No results for input(s): GLUCAP in the last 168 hours. D-Dimer No results for input(s): DDIMER in the last 72 hours. Hgb A1c No results for input(s): HGBA1C in the last 72 hours. Lipid Profile No results for input(s): CHOL, HDL, LDLCALC, TRIG, CHOLHDL, LDLDIRECT in the last 72 hours. Thyroid function studies No results for input(s): TSH, T4TOTAL, T3FREE, THYROIDAB in the last 72 hours.  Invalid input(s): FREET3 Anemia work up No results for input(s): VITAMINB12, FOLATE, FERRITIN, TIBC, IRON, RETICCTPCT in the last 72 hours. Urinalysis    Component Value Date/Time   COLORURINE YELLOW 01/18/2021  2000   APPEARANCEUR CLEAR 01/18/2021 2000   LABSPEC 1.021 01/18/2021 2000   PHURINE 5.0 01/18/2021 2000   GLUCOSEU NEGATIVE 01/18/2021 2000   HGBUR NEGATIVE 01/18/2021 2000   BILIRUBINUR NEGATIVE 01/18/2021 2000   KETONESUR 20 (A) 01/18/2021 2000   PROTEINUR NEGATIVE 01/18/2021 2000   NITRITE NEGATIVE 01/18/2021 2000   LEUKOCYTESUR TRACE (A) 01/18/2021 2000   Sepsis Labs Invalid input(s): PROCALCITONIN,  WBC,  LACTICIDVEN Microbiology Recent Results (from the past 240 hour(s))  Resp Panel by RT-PCR (Flu A&B, Covid) Nasopharyngeal Swab     Status: None   Collection Time: 01/18/21  4:53 PM   Specimen: Nasopharyngeal Swab; Nasopharyngeal(NP) swabs in vial transport medium  Result Value Ref Range Status   SARS Coronavirus 2 by RT PCR NEGATIVE NEGATIVE Final    Comment: (NOTE) SARS-CoV-2 target nucleic acids are NOT DETECTED.  The SARS-CoV-2 RNA is generally detectable in upper respiratory specimens during the acute phase of infection. The lowest concentration of SARS-CoV-2 viral copies this assay can detect is 138 copies/mL. A negative result does not preclude SARS-Cov-2 infection and should not be used as the sole basis for treatment or other patient management decisions. A negative result may occur with  improper specimen collection/handling, submission of specimen other than nasopharyngeal swab, presence of viral mutation(s) within the areas targeted by this assay, and inadequate number of viral copies(<138 copies/mL). A negative result must be combined with clinical observations, patient history, and epidemiological information. The expected result is Negative.  Fact Sheet for Patients:  EntrepreneurPulse.com.au  Fact Sheet for Healthcare Providers:  IncredibleEmployment.be  This test is no t yet approved or cleared by the [place] FDA and  has been authorized for detection and/or diagnosis of SARS-CoV-2 by FDA under an Emergency  Use Authorization (EUA). This EUA will remain  in effect (meaning this test can be used) for the duration of the COVID-19 declaration under Section 564(b)(1) of the Act, 21 U.S.C.section 360bbb-3(b)(1), unless the authorization is terminated  or revoked sooner.       Influenza A by PCR NEGATIVE NEGATIVE Final   Influenza B by PCR NEGATIVE NEGATIVE Final    Comment: (NOTE) The Xpert Xpress SARS-CoV-2/FLU/RSV plus assay is intended as an aid in the diagnosis of influenza from Nasopharyngeal swab specimens and should not be used as a sole basis for treatment. Nasal washings  and aspirates are unacceptable for Xpert Xpress SARS-CoV-2/FLU/RSV testing.  Fact Sheet for Patients: EntrepreneurPulse.com.au  Fact Sheet for Healthcare Providers: IncredibleEmployment.be  This test is not yet approved or cleared by the [place] FDA and has been authorized for detection and/or diagnosis of SARS-CoV-2 by FDA under an Emergency Use Authorization (EUA). This EUA will remain in effect (meaning this test can be used) for the duration of the COVID-19 declaration under Section 564(b)(1) of the Act, 21 U.S.C. section 360bbb-3(b)(1), unless the authorization is terminated or revoked.  Performed at [facility], [place] [address] [place], [place] [address]   Culture, blood (routine x 2)     Status: None (Preliminary result)   Collection Time: 01/18/21  5:37 PM   Specimen: Left Antecubital; Blood  Result Value Ref Range Status   Specimen Description   Final    LEFT ANTECUBITAL Performed at [facility] [address], [place], [place] [address]    Special Requests   Final    BOTTLES DRAWN AEROBIC AND ANAEROBIC Blood Culture adequate volume Performed at [facility] [address], [place], [place] [address]    Culture   Final    NO GROWTH < 24 HOURS Performed at [facility] [address] [place], [place] [address]    Report Status PENDING  Incomplete  Culture, blood (routine x 2)     Status: None (Preliminary result)   Collection Time: 01/18/21  6:09 PM   Specimen: Left Antecubital; Blood  Result Value Ref Range Status   Specimen Description   Final    LEFT ANTECUBITAL Performed at [facility] [address], [place], [place] [address]    Special Requests   Final    BOTTLES DRAWN AEROBIC AND ANAEROBIC Blood Culture adequate volume Performed at [facility] [address] [place], [place] [address]    Culture   Final    NO GROWTH < 24 HOURS Performed at [facility] [address] [place], [place] [address]    Report Status PENDING  Incomplete     Time coordinating discharge: Over 30 minutes  SIGNED:   Shawna Clamp, MD  [facility] 01/20/2021, 2:27 PM Pager   If 7PM-7AM, please contact night-coverage

## 2021-01-20 NOTE — Progress Notes (Signed)
[facility] Progress Note  Norma Morales 75 y.o. Jul 02, 1945  CC: Abdominal pain, metastatic colon cancer  Subjective: Patient seen and examined at bedside.  Abdominal pain has improved but not resolved.  Denies nausea or vomiting.  ROS : Febrile, negative for chest pain   Objective: Vital signs in last 24 hours: Vitals:   01/19/21 2017 01/20/21 0523  BP: 121/78 121/60  Pulse: 80 83  Resp: 20 18  Temp: 98.5 F (36.9 C) 98.3 F (36.8 C)  SpO2: 94% 95%    Physical Exam:  General:  Alert, cooperative, no distress, appears stated age  Head:  Normocephalic, without obvious abnormality, atraumatic  Eyes:  , EOM's intact,   Lungs:   Clear to auscultation bilaterally, respirations unlabored  Heart:  Regular rate and rhythm, S1, S2 normal  Abdomen:   Soft, non-tender, nondistended, bowel sound present, no peritoneal signs  Extremities: Extremities normal, atraumatic, no  edema  Pulses: 2+ and symmetric    Lab Results: Recent Labs    01/19/21 0536 01/20/21 0858  NA 132* 132*  K 3.6 3.7  CL 100 104  CO2 24 21*  GLUCOSE 95 91  BUN 7* <5*  CREATININE 0.36* 0.50  CALCIUM 8.1* 8.0*  MG  --  2.0  PHOS  --  2.5   Recent Labs    01/19/21 0536 01/20/21 0858  AST 220* 147*  ALT 188* 153*  ALKPHOS 368* 357*  BILITOT 2.0* 1.4*  PROT 6.0* 5.6*  ALBUMIN 2.7* 2.6*   Recent Labs    01/18/21 1529 01/19/21 0536 01/20/21 0858  WBC 13.0* 8.3 9.5  NEUTROABS 10.6* 6.3  --   HGB 10.5* 9.2* 9.0*  HCT 32.7* 28.8* 28.4*  MCV 81.5 83.5 83.5  PLT 431* 327 322   No results for input(s): LABPROT, INR in the last 72 hours.    Assessment/Plan: - metastatic colon cancer with liver mets.  Liver biopsy positive for metastatic adenocarcinoma with colorectal primary -Abdominal pain and fever following liver biopsy.  CT scan negative for any acute changes except for biliary ductal dilation. -Dilated biliary system with MRI MRCP showing smooth narrowing of the mid CBD from  extrinsic compression from local lymphadenopathy.   Recommendations -------------------------- -Patient's abdominal pain is improving.  LFTs trending down. -No plan for ERCP at this time -Liver biopsy findings discussed with the patient and patient's family at bedside. -Okay to discharge from GI standpoint.  GI will sign off.  Call us back if needed   Otis Brace MD, FACP 01/20/2021, 1:09 PM  Contact #  [phone]

## 2021-01-20 NOTE — Progress Notes (Signed)
PROGRESS NOTE    Norma Morales  ONG:[id number] DOB: Oct 03, 1945 DOA: 01/18/2021 PCP: Jene Every, MD   Brief Narrative:  This 75 years old female with PMH significant for hyperlipidemia who was recently diagnosed with colonic mass, underwent colonoscopy and biopsy about 10 days ago results of which are still not available, she had underwent liver biopsy 2 days ago presented in the ED with worsening pain in the right upper quadrant.  Patient also reported nausea, vomiting but denies any diarrhea. She is found to have elevated liver enzymes on labs.  CT abdomen showed features concerning for narrowing of CBD and intra and extra hepatic biliary duct dilatation.  MRCP consistent with liver lesions,  suspected of liver mets.  GI is consulted, states there is no need for ERCP or stent as liver enzymes are trending down.  Patient reports abdominal pain has improved.   Assessment & Plan:   Principal Problem:   RUQ pain Active Problems:   Abdominal pain  RUQ pain could be secondary to liver mets: Patient was recently diagnosed with colonic mass underwent colonoscopy and biopsy 10 days ago,  results are still not available.  She also found to have liver lesions underwent liver biopsy 2 days ago. She has developed right upper quadrant pain postprocedure associated with elevated LFTs CT abdomen and MRCP consistent with liver lesion intra and extrahepatic biliary duct dilatation. Patient reports abdominal pain has improved with pain control. Continue supportive care.  Continue IV hydration and IV Zofran as needed. GI is consulted,  recommended to await liver biopsy before proceeding with ERCP or unless symptomatic. Continue clear liquid diet and advance as tolerated.  Palpitations: Continue atenolol.  Hyperlipidemia: Not on any statins  Leukocytosis:  Could be reactive.  No signs of infection     DVT prophylaxis: SCDs Code Status: Full code Family Communication: Husband at  bedside Disposition Plan:    Status is: Observation  The patient remains OBS appropriate and will d/c before 2 midnights.  Intra and extra hepatic biliary duct dilatation.  May require ERCP.    Consultants:  Gastroenterology  Procedures: CT abdomen and pelvis, MRCP Antimicrobials:  Anti-infectives (From admission, onward)    None        Subjective: Patient was seen and examined at bedside.  Overnight events noted.   Patient reports feeling much improved.  She denies any nausea and vomiting.  LFTs are trending down. She reports abdominal pain is improving.  Objective: Vitals:   01/19/21 1004 01/19/21 1824 01/19/21 2017 01/20/21 0523  BP: (!) 116/51 (!) 131/59 121/78 121/60  Pulse: 78 79 80 83  Resp: 18 15 20 18   Temp: 99.3 F (37.4 C) 97.9 F (36.6 C) 98.5 F (36.9 C) 98.3 F (36.8 C)  TempSrc: Oral Oral Oral Oral  SpO2: 94% (!) 89% 94% 95%  Weight:      Height:        Intake/Output Summary (Last 24 hours) at 01/20/2021 1041 Last data filed at 01/19/2021 2106 Gross per 24 hour  Intake 2456.6 ml  Output --  Net 2456.6 ml   Filed Weights   01/18/21 1920  Weight: 66.2 kg    Examination:  General exam: Appears comfortable, not in any acute distress.  Deconditioned Respiratory system: Clear to auscultation bilaterally. Respiratory effort normal.  RR 15 Cardiovascular system: S1-S2 heard, regular rate and rhythm, no murmur.   Gastrointestinal system: Abdomen is soft, nontender, nondistended, BS+ Central nervous system: Alert and oriented. No focal neurological deficits. Extremities: Symmetric 5  x 5 power. Skin: No rashes, lesions or ulcers Psychiatry: Judgement and insight appear normal. Mood & affect appropriate.     Data Reviewed: I have personally reviewed following labs and imaging studies  CBC: Recent Labs  Lab 01/17/21 1140 01/18/21 1529 01/19/21 0536 01/20/21 0858  WBC 14.0* 13.0* 8.3 9.5  NEUTROABS  --  10.6* 6.3  --   HGB 11.1* 10.5*  9.2* 9.0*  HCT 34.8* 32.7* 28.8* 28.4*  MCV 83.7 81.5 83.5 83.5  PLT 454* 431* 327 094   Basic Metabolic Panel: Recent Labs  Lab 01/17/21 1140 01/18/21 1529 01/19/21 0536 01/20/21 0858  NA 137 132* 132* 132*  K 4.1 4.0 3.6 3.7  CL 100 98 100 104  CO2 26 25 24  21*  GLUCOSE 114* 119* 95 91  BUN 9 10 7* <5*  CREATININE 0.64 0.66 0.36* 0.50  CALCIUM 9.2 9.0 8.1* 8.0*  MG  --   --   --  2.0  PHOS  --   --   --  2.5   GFR: Estimated Creatinine Clearance: 56.4 mL/min (by C-G formula based on SCr of 0.5 mg/dL). Liver Function Tests: Recent Labs  Lab 01/17/21 1140 01/18/21 1529 01/19/21 0536 01/20/21 0858  AST 201* 246* 220* 147*  ALT 187* 234* 188* 153*  ALKPHOS 404* 443* 368* 357*  BILITOT 0.9 0.8 2.0* 1.4*  PROT 7.7 7.1 6.0* 5.6*  ALBUMIN 3.6 3.0* 2.7* 2.6*   Recent Labs  Lab 01/18/21 1756  LIPASE 31   No results for input(s): AMMONIA in the last 168 hours. Coagulation Profile: Recent Labs  Lab 01/17/21 1140  INR 1.0   Cardiac Enzymes: No results for input(s): CKTOTAL, CKMB, CKMBINDEX, TROPONINI in the last 168 hours. BNP (last 3 results) No results for input(s): PROBNP in the last 8760 hours. HbA1C: No results for input(s): HGBA1C in the last 72 hours. CBG: No results for input(s): GLUCAP in the last 168 hours. Lipid Profile: No results for input(s): CHOL, HDL, LDLCALC, TRIG, CHOLHDL, LDLDIRECT in the last 72 hours. Thyroid Function Tests: No results for input(s): TSH, T4TOTAL, FREET4, T3FREE, THYROIDAB in the last 72 hours. Anemia Panel: No results for input(s): VITAMINB12, FOLATE, FERRITIN, TIBC, IRON, RETICCTPCT in the last 72 hours. Sepsis Labs: Recent Labs  Lab 01/18/21 1736 01/18/21 1809  LATICACIDVEN 1.5 1.5    Recent Results (from the past 240 hour(s))  Resp Panel by RT-PCR (Flu A&B, Covid) Nasopharyngeal Swab     Status: None   Collection Time: 01/18/21  4:53 PM   Specimen: Nasopharyngeal Swab; Nasopharyngeal(NP) swabs in vial transport  medium  Result Value Ref Range Status   SARS Coronavirus 2 by RT PCR NEGATIVE NEGATIVE Final    Comment: (NOTE) SARS-CoV-2 target nucleic acids are NOT DETECTED.  The SARS-CoV-2 RNA is generally detectable in upper respiratory specimens during the acute phase of infection. The lowest concentration of SARS-CoV-2 viral copies this assay can detect is 138 copies/mL. A negative result does not preclude SARS-Cov-2 infection and should not be used as the sole basis for treatment or other patient management decisions. A negative result may occur with  improper specimen collection/handling, submission of specimen other than nasopharyngeal swab, presence of viral mutation(s) within the areas targeted by this assay, and inadequate number of viral copies(<138 copies/mL). A negative result must be combined with clinical observations, patient history, and epidemiological information. The expected result is Negative.  Fact Sheet for Patients:  EntrepreneurPulse.com.au  Fact Sheet for Healthcare Providers:  IncredibleEmployment.be  This test  is no t yet approved or cleared by the [place] and  has been authorized for detection and/or diagnosis of SARS-CoV-2 by FDA under an Emergency Use Authorization (EUA). This EUA will remain  in effect (meaning this test can be used) for the duration of the COVID-19 declaration under Section 564(b)(1) of the Act, 21 U.S.C.section 360bbb-3(b)(1), unless the authorization is terminated  or revoked sooner.       Influenza A by PCR NEGATIVE NEGATIVE Final   Influenza B by PCR NEGATIVE NEGATIVE Final    Comment: (NOTE) The Xpert Xpress SARS-CoV-2/FLU/RSV plus assay is intended as an aid in the diagnosis of influenza from Nasopharyngeal swab specimens and should not be used as a sole basis for treatment. Nasal washings and aspirates are unacceptable for Xpert Xpress SARS-CoV-2/FLU/RSV testing.  Fact Sheet for  Patients: EntrepreneurPulse.com.au  Fact Sheet for Healthcare Providers: IncredibleEmployment.be  This test is not yet approved or cleared by the [place] FDA and has been authorized for detection and/or diagnosis of SARS-CoV-2 by FDA under an Emergency Use Authorization (EUA). This EUA will remain in effect (meaning this test can be used) for the duration of the COVID-19 declaration under Section 564(b)(1) of the Act, 21 U.S.C. section 360bbb-3(b)(1), unless the authorization is terminated or revoked.  Performed at [facility], [place] [address], [place], [place] [address]   Culture, blood (routine x 2)     Status: None (Preliminary result)   Collection Time: 01/18/21  5:37 PM   Specimen: Left Antecubital; Blood  Result Value Ref Range Status   Specimen Description   Final    LEFT ANTECUBITAL Performed at [facility] [address] [place], [place] [address]    Special Requests   Final    BOTTLES DRAWN AEROBIC AND ANAEROBIC Blood Culture adequate volume Performed at [facility] [address] [place], [place] [address]    Culture   Final    NO GROWTH < 24 HOURS Performed at [facility] [address] [place], [place] [address]    Report Status PENDING  Incomplete  Culture, blood (routine x 2)     Status: None (Preliminary result)   Collection Time: 01/18/21  6:09 PM   Specimen: Left Antecubital; Blood  Result Value Ref Range Status   Specimen Description   Final    LEFT ANTECUBITAL Performed at [facility] [address] [place], [place] [address]    Special Requests   Final    BOTTLES DRAWN AEROBIC AND ANAEROBIC Blood Culture adequate volume Performed at [facility] [address] [place], [place] [address]    Culture   Final    NO GROWTH < 24 HOURS Performed at [facility] [address] [place],  [place] [address]    Report Status PENDING  Incomplete    Radiology Studies: CT Abdomen Pelvis W Contrast  Result Date: 01/18/2021 CLINICAL DATA:  Concern for abdominal abscess or infection. History of metastatic colon cancer. Status post core biopsy of right hepatic mass. EXAM: CT ABDOMEN AND PELVIS WITH CONTRAST TECHNIQUE: Multidetector CT imaging of the abdomen and pelvis was performed using the standard protocol following bolus administration of intravenous contrast. CONTRAST:  49mL OMNIPAQUE IOHEXOL 350 MG/ML SOLN COMPARISON:  None. FINDINGS: Lower chest: Bibasilar subpleural atelectasis/scarring. A 7 mm nodular density in the left lower lobe most consistent with metastatic disease. No intra-abdominal free air.  Small free fluid in the pelvis. Hepatobiliary:  Multiple hepatic hypodense lesions consistent with metastatic disease. There is a 5.3 x 5.1 cm hypoenhancing area in the right lobe of the liver with small pockets of air corresponding to the biopsied lesion. This likely represent an infiltrative mass or metastasis. An infectious process or developing abscess is less likely but not excluded. No drainable fluid collection identified. No calcified gallstone. There is dilatation of the intrahepatic and extrahepatic biliary tree. The common bile duct measures 17 mm in diameter. There is somewhat narrowing of the central CBD which is suboptimally evaluated on this CT but may be related to mass effect and compression by adjacent adenopathy. Further evaluation with MRI/MRCP is recommended. Pancreas: There is a 12 x 10 mm low attenuating nodule in the body of the pancreas (31/2). Several additional low attenuating lesions in the region of the head of the pancreas noted which may represent peripancreatic adenopathy or metastatic disease. No active inflammatory changes of the pancreas. No gland atrophy or dilatation of the main pancreatic duct. Spleen: Normal in size without focal abnormality. Adrenals/Urinary Tract:  The adrenal glands are unremarkable. The kidneys, visualized ureters, and urinary bladder appear unremarkable. Stomach/Bowel: There is sigmoid diverticulosis without active inflammatory changes. There is moderate stool throughout the colon. There is thickened and irregular appearance of the wall of the ascending colon in the region of the hepatic flexure likely representing known colonic mass/malignancy. There is no bowel obstruction. The appendix is normal. Vascular/Lymphatic: Mild aortoiliac atherosclerotic disease. The IVC is unremarkable. No portal venous gas. Retroperitoneal adenopathy. Reproductive: The uterus is grossly unremarkable. Other: Scattered omental implants consistent with metastatic disease. The largest implant in the left lower quadrant measures approximately 17 x 20 mm (65/2). Musculoskeletal: Degenerative changes of the spine. No acute osseous pathology. IMPRESSION: 1. Thickened and irregular appearance of the wall of the ascending colon in the region of the hepatic flexure likely representing known colonic mass/malignancy. 2. Multiple hepatic hypodense lesions consistent with metastatic disease. The largest lesion in the right lobe of the liver has been biopsied. No drainable fluid collection/abscess. 3. Dilated intrahepatic and extrahepatic biliary tree with findings concerning for narrowing of the central CBD. Further evaluation with MRI/MRCP is recommended. 4. Scattered omental implants consistent with metastatic disease. 5. Left lower lobe pulmonary nodule/metastatic disease. 6. Sigmoid diverticulosis. No bowel obstruction. Normal appendix. 7. Aortic Atherosclerosis (ICD10-I70.0). Electronically Signed   By: Anner Crete M.D.   On: 01/18/2021 21:04   MR 3D Recon At Scanner  Result Date: 01/19/2021 CLINICAL DATA:  Right upper quadrant abdominal pain, nondiagnostic ultrasound. EXAM: MRI ABDOMEN WITHOUT AND WITH CONTRAST (INCLUDING MRCP) TECHNIQUE: Multiplanar multisequence MR imaging  of the abdomen was performed both before and after the administration of intravenous contrast. Heavily T2-weighted images of the biliary and pancreatic ducts were obtained, and three-dimensional MRCP images were rendered by post processing. CONTRAST:  49mL GADAVIST GADOBUTROL 1 MMOL/ML IV SOLN COMPARISON:  01/18/2021. FINDINGS: Lower chest: Atelectasis or infiltrate is noted at the lung bases. There is redemonstration of a nodular lesion in the left lower lobe measuring 8 mm. Trace bilateral pleural effusions are present. Hepatobiliary: There is a heterogeneous ill-defined masslike lesion with rim enhancement in the posterior right lobe of the liver measuring 5.6 x 5.6 cm. Additional smaller T2 hyperintensities are present in the right lobe of the liver with rim enhancement. Intrahepatic and extrahepatic biliary ductal dilatation is noted. The proximal common bile duct is distended measuring 1.7 cm in diameter. There is segmental narrowing with smooth shouldering of the mid common  bile duct, which may be related to extrinsic compression from lymphadenopathy. No enhancing mass is identified in this region. The distal common bile duct is normal in caliber. No choledocholithiasis. The gallbladder is contracted and contains T1 hyperintense material, possible sludge. Pancreas: The pancreas enhances normally. The previously described low-attenuation nodule in the body of the pancreas is not well seen on this exam. No significant pancreatic ductal dilatation. Spleen:  Within normal limits in size and appearance. Adrenals/Urinary Tract: The adrenal glands are within normal limits. No evidence of hydronephrosis. A few subcentimeter T2 hyperintensities are seen in the kidneys which are too small to further characterize and may represent cysts. Stomach/Bowel: No bowel obstruction. There is focal colonic wall narrowing with wall thickening and enhancement involving the hepatic flexure, which may represent patient's known mass. A  moderate amount of retained stool is present in the colon. Vascular/Lymphatic: Multiple prominent lymph nodes are seen in the gastrohepatic ligament, porta hepatis, and retroperitoneum. There are nodules with restricted diffusion in the anterior peritoneum, concerning for metastatic disease. The aorta is normal in caliber. The splenic vein, portal vein, and superior mesenteric vein are patent. Other:  No free fluid. Musculoskeletal: No suspicious bone lesions identified. IMPRESSION: 1. Multiple rim enhancing lesions in the liver, the largest measuring 5.6 x 5.6 cm, suggesting metastatic disease. Given history of recent biopsy, correlation with biopsy results is recommended. 2. Intrahepatic and extrahepatic biliary ductal dilatation. There is smooth shouldering of the mid common bile duct with segmental narrowing which may be due to extrinsic mass effect from local lymphadenopathy. No definite infiltrative mass is seen. 3. Enhancing mass at the colonic hepatic flexure, may represent patient's known colon cancer. 4. Multiple enlarged lymph nodes in the porta hepatis, gastrohepatic ligament, and retroperitoneum. There also nodules with restricted diffusion in the anterior peritoneum, concerning for peritoneal metastasis. 5. Left lower lobe pulmonary nodule, possible metastatic disease. Electronically Signed   By: Brett Fairy M.D.   On: 01/19/2021 04:22   MR ABDOMEN MRCP W WO CONTAST  Result Date: 01/19/2021 CLINICAL DATA:  Right upper quadrant abdominal pain, nondiagnostic ultrasound. EXAM: MRI ABDOMEN WITHOUT AND WITH CONTRAST (INCLUDING MRCP) TECHNIQUE: Multiplanar multisequence MR imaging of the abdomen was performed both before and after the administration of intravenous contrast. Heavily T2-weighted images of the biliary and pancreatic ducts were obtained, and three-dimensional MRCP images were rendered by post processing. CONTRAST:  20mL GADAVIST GADOBUTROL 1 MMOL/ML IV SOLN COMPARISON:  01/18/2021.  FINDINGS: Lower chest: Atelectasis or infiltrate is noted at the lung bases. There is redemonstration of a nodular lesion in the left lower lobe measuring 8 mm. Trace bilateral pleural effusions are present. Hepatobiliary: There is a heterogeneous ill-defined masslike lesion with rim enhancement in the posterior right lobe of the liver measuring 5.6 x 5.6 cm. Additional smaller T2 hyperintensities are present in the right lobe of the liver with rim enhancement. Intrahepatic and extrahepatic biliary ductal dilatation is noted. The proximal common bile duct is distended measuring 1.7 cm in diameter. There is segmental narrowing with smooth shouldering of the mid common bile duct, which may be related to extrinsic compression from lymphadenopathy. No enhancing mass is identified in this region. The distal common bile duct is normal in caliber. No choledocholithiasis. The gallbladder is contracted and contains T1 hyperintense material, possible sludge. Pancreas: The pancreas enhances normally. The previously described low-attenuation nodule in the body of the pancreas is not well seen on this exam. No significant pancreatic ductal dilatation. Spleen:  Within normal limits in size  and appearance. Adrenals/Urinary Tract: The adrenal glands are within normal limits. No evidence of hydronephrosis. A few subcentimeter T2 hyperintensities are seen in the kidneys which are too small to further characterize and may represent cysts. Stomach/Bowel: No bowel obstruction. There is focal colonic wall narrowing with wall thickening and enhancement involving the hepatic flexure, which may represent patient's known mass. A moderate amount of retained stool is present in the colon. Vascular/Lymphatic: Multiple prominent lymph nodes are seen in the gastrohepatic ligament, porta hepatis, and retroperitoneum. There are nodules with restricted diffusion in the anterior peritoneum, concerning for metastatic disease. The aorta is normal in  caliber. The splenic vein, portal vein, and superior mesenteric vein are patent. Other:  No free fluid. Musculoskeletal: No suspicious bone lesions identified. IMPRESSION: 1. Multiple rim enhancing lesions in the liver, the largest measuring 5.6 x 5.6 cm, suggesting metastatic disease. Given history of recent biopsy, correlation with biopsy results is recommended. 2. Intrahepatic and extrahepatic biliary ductal dilatation. There is smooth shouldering of the mid common bile duct with segmental narrowing which may be due to extrinsic mass effect from local lymphadenopathy. No definite infiltrative mass is seen. 3. Enhancing mass at the colonic hepatic flexure, may represent patient's known colon cancer. 4. Multiple enlarged lymph nodes in the porta hepatis, gastrohepatic ligament, and retroperitoneum. There also nodules with restricted diffusion in the anterior peritoneum, concerning for peritoneal metastasis. 5. Left lower lobe pulmonary nodule, possible metastatic disease. Electronically Signed   By: Brett Fairy M.D.   On: 01/19/2021 04:22     Scheduled Meds:  atenolol  25 mg Oral q1800   Chlorhexidine Gluconate Cloth  6 each Topical Daily   Continuous Infusions:  sodium chloride 125 mL/hr at 01/19/21 2226     LOS: 0 days    Time spent: 35 mins    Emmette Katt, MD [facility]   If 7PM-7AM, please contact night-coverage

## 2021-01-23 ENCOUNTER — Inpatient Hospital Stay: Payer: Medicare HMO

## 2021-01-23 ENCOUNTER — Other Ambulatory Visit: Payer: Self-pay

## 2021-01-23 ENCOUNTER — Telehealth: Payer: Self-pay | Admitting: Physician Assistant

## 2021-01-23 ENCOUNTER — Inpatient Hospital Stay: Payer: Medicare HMO | Admitting: Physician Assistant

## 2021-01-23 DIAGNOSIS — C189 Malignant neoplasm of colon, unspecified: Secondary | ICD-10-CM | POA: Diagnosis not present

## 2021-01-23 DIAGNOSIS — C787 Secondary malignant neoplasm of liver and intrahepatic bile duct: Secondary | ICD-10-CM

## 2021-01-23 DIAGNOSIS — C786 Secondary malignant neoplasm of retroperitoneum and peritoneum: Secondary | ICD-10-CM | POA: Insufficient documentation

## 2021-01-23 LAB — CULTURE, BLOOD (ROUTINE X 2)
Culture: NO GROWTH
Culture: NO GROWTH
Special Requests: ADEQUATE
Special Requests: ADEQUATE

## 2021-01-23 MED ORDER — ONDANSETRON HCL 8 MG PO TABS: 8.0000 mg | ORAL_TABLET | Freq: Three times a day (TID) | ORAL | 3 refills | Status: AC | PRN

## 2021-01-23 MED ORDER — OXYCODONE HCL 5 MG PO TABS: 5.0000 mg | ORAL_TABLET | Freq: Three times a day (TID) | ORAL | 0 refills | Status: DC | PRN

## 2021-01-23 MED ORDER — PROCHLORPERAZINE MALEATE 10 MG PO TABS: 10.0000 mg | ORAL_TABLET | Freq: Four times a day (QID) | ORAL | 3 refills | Status: AC | PRN

## 2021-01-23 MED ORDER — LIDOCAINE-PRILOCAINE 2.5-2.5 % EX CREA: 1.0000 "application " | TOPICAL_CREAM | CUTANEOUS | 0 refills | Status: DC | PRN

## 2021-01-23 NOTE — Progress Notes (Signed)
I met with Norma and Norma Morales and their daughter after their consultation with Dr Lorenso Courier and Dede Query.  I explained my role as a nurse navigator and provided my contact information.  I explained the services provided at [facility] and provided written information. I briefly reviewed use of EMLA cream on her port a cath.  She verbalized understating.  I let her know that a financial advocate will reach out to her at the time of her chemo education appointment to review our 1 time grant eligibility.  All questions were answered.  They verbalized understanding.

## 2021-01-23 NOTE — Progress Notes (Signed)
[place] Telephone:([phone]) [phone]   [phone]  PROGRESS NOTE  Patient Care Team: Jene Every, MD as PCP - General (Family Medicine)  Hematological/Oncological History 1) 12/19/2020: Presented to PCP with RUQ abdominal pain and constipation.    2) 12/28/2020: CT abdomen/pelvis: Short segment colonic wall thickening at the hepatic flexure with pericolonic stranding and nodularity, concerning for primary colonic neoplasm. Recommend correlation with colonoscopy.  Findings of metastatic disease including multifocal omental, mesenteric, and peritoneal/retroperitoneal soft tissue implants, portocaval and retroperitoneal lymphadenopathy and multiple hepatic metastases. Question lucent lesion involving the S2 vertebral body with soft tissue density extending into the adjacent canal and partially expanded left S2 neural foramina. Recommend dedicated MRI of the sacrum for further characterization. Subpleural 0.7 cm nodule in the lingula is indeterminate. Further evaluation with dedicated CT chest is recommended.  A 1.0 cm lesion in the pancreatic neck without pancreatic ductal dilatation. This could be further characterized with MRI of the abdomen.   3) 01/04/2021: Establish care at [facility].   4) 01/06/2021: Underwent colonoscopy with Dr. Kathreen Devoid [facility]).  Findings revealed a circumferential, near obstructing colon mass concerning for malignancy.  Pathology revealed superficial fragments of colonic mucosa with extensive high-grade dysplasia.  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  No suspicious osseous lesion.  6) 01/11/2021: CT chest: Multiple small pulmonary densities in the bilateral lower lobes of indeterminate etiology.  Ill-defined hypodense lesion in the liver measuring at least 5.5 x 4.0 cm as well as few small hypodense lesions concerning for malignant process.  No mediastinal  lymphadenopathy.  No suspicious osseous lesions  7) 01/17/2021: Underwent port placement and liver biopsy.  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  CHIEF COMPLAINTS/PURPOSE OF CONSULTATION:  Metastatic colon cancer   HISTORY OF PRESENTING ILLNESS:  Norma Morales returns for follow-up for metastatic colon cancer.  She is accompanied by her husband and daughter for this visit.  On exam today, Norma Morales reports that her energy levels are low. She is able to compete her basic ADLs on her own. This includes bathing and getting dressed. Her appetite is fair and is mainly eating liquids. She denies any nausea or vomiting. Her RUQ pain has markedly improved since hospital discharge and taking oxycodone 5 mg every 8 hours. She rates her abdominal pain as 2 out of 10 on a pain scale. Her last bowel movement was on Friday. Since then, she hasn't had a bowel movement. She denies easy bruising or signs of bleeding. Patient has intermittent episodes of hot flashes. She denies fevers, chills, night sweats, shortness of breath, chest pain, cough, peripheral edema, neuropathy or skin changes. She has no other complaints. Remaining ROS is below.   MEDICAL HISTORY:  Past Medical History:  Diagnosis Date   Cancer ([place])    colon   Hyperlipidemia     SURGICAL HISTORY: Past Surgical History:  Procedure Laterality Date   IR IMAGING GUIDED PORT INSERTION  01/17/2021   IR US GUIDE BX ASP/DRAIN  01/17/2021   TONSILLECTOMY      SOCIAL HISTORY: Social History   Socioeconomic History   Marital status: Married    Spouse name: Not on file   Number of children: Not on file   Years of education: Not on file   Highest education level: Not on file  Occupational History   Not on file  Tobacco Use   Smoking status: Never   Smokeless tobacco:  Never  Substance and Sexual Activity   Alcohol use: Never   Drug use: Never   Sexual activity: Not on file  Other Topics  Concern   Not on file  Social History Narrative   Not on file   Social Determinants of Health   Financial Resource Strain: Not on file  Food Insecurity: Not on file  Transportation Needs: Not on file  Physical Activity: Not on file  Stress: Not on file  Social Connections: Not on file  Intimate Partner Violence: Not on file    FAMILY HISTORY: No family history on file.  ALLERGIES:  is allergic to aspirin.  MEDICATIONS:  Current Outpatient Medications  Medication Sig Dispense Refill   alendronate (FOSAMAX) 70 MG tablet Take 70 mg by mouth once a week.     ascorbic acid (VITAMIN C) 500 MG tablet Take 500 mg by mouth daily.     atenolol (TENORMIN) 25 MG tablet Take 25 mg by mouth daily.     b complex vitamins capsule Take 1 capsule by mouth daily.     Calcium Carbonate (CALCIUM 600 PO) Take 600 mg by mouth daily.     Cholecalciferol 25 MCG (1000 UT) capsule Take 1,000 Units by mouth daily.     Coenzyme Q10 (COQ10) 50 MG CAPS Take 50 mg by mouth daily.     ezetimibe (ZETIA) 10 MG tablet Take 10 mg by mouth daily.     Glucosamine-Chondroitin (GLUCOSAMINE CHONDR COMPLEX PO) Take 1 tablet by mouth daily.     lidocaine-prilocaine (EMLA) cream Apply 1 application topically as needed. 30 g 0   Omega-3 1000 MG CAPS Take 1,000 mg by mouth daily.     ondansetron (ZOFRAN) 8 MG tablet Take 1 tablet (8 mg total) by mouth every 8 (eight) hours as needed for nausea or vomiting. 60 tablet 3   Probiotic Product (PROBIOTIC BLEND PO) Take 1 capsule by mouth daily.     prochlorperazine (COMPAZINE) 10 MG tablet Take 1 tablet (10 mg total) by mouth every 6 (six) hours as needed for nausea or vomiting. 60 tablet 3   Resveratrol-Quercetin 100-100 MG TABS Take 100 mg by mouth daily.     Vitamin E 268 MG (400 UNIT) CAPS Take 400 Units by mouth daily.     Zinc 50 MG TABS Take 50 mg by mouth daily.     oxyCODONE (ROXICODONE) 5 MG immediate release tablet Take 1 tablet (5 mg total) by mouth every 8 (eight)  hours as needed. 90 tablet 0   No current facility-administered medications for this visit.    REVIEW OF SYSTEMS:   Constitutional: ( - ) fevers, ( - )  chills , ( - ) night sweats Eyes: ( - ) blurriness of vision, ( - ) double vision, ( - ) watery eyes Ears, nose, mouth, throat, and face: ( - ) mucositis, ( - ) sore throat Respiratory: ( - ) cough, ( - ) dyspnea, ( - ) wheezes Cardiovascular: ( - ) palpitation, ( - ) chest discomfort, ( - ) lower extremity swelling Gastrointestinal:  ( - ) nausea, ( - ) heartburn, ( - ) change in bowel habits Skin: ( - ) abnormal skin rashes Lymphatics: ( - ) new lymphadenopathy, ( - ) easy bruising Neurological: ( - ) numbness, ( - ) tingling, ( - ) new weaknesses Behavioral/Psych: ( - ) mood change, ( - ) new changes  All other systems were reviewed with the patient and are negative.  PHYSICAL EXAMINATION:  ECOG PERFORMANCE STATUS: 1 - Symptomatic but completely ambulatory  Vitals:   01/23/21 0928  BP: (!) 124/59  Pulse: 87  Resp: 18  Temp: 97.7 F (36.5 C)  SpO2: 99%   Filed Weights   01/23/21 0928  Weight: 145 lb 8 oz (66 kg)    GENERAL: well appearing female in NAD  SKIN:  rashes or significant lesions. Jaundice.  EYES: conjunctiva are pink and non-injected, sclera icteric OROPHARYNX: no exudate, no erythema; lips, buccal mucosa, and tongue normal  NECK: supple, non-tender LYMPH:  no palpable lymphadenopathy in the cervical or supraclavicular lymph nodes.  LUNGS: clear to auscultation and percussion with normal breathing effort HEART: regular rate & rhythm and no murmurs and no lower extremity edema ABDOMEN: Tenderness to palpation in RUQ. No guarding. Abdomen is non-distended, normal bowel sounds Musculoskeletal: no cyanosis of digits and no clubbing  PSYCH: alert & oriented x 3, fluent speech NEURO: no focal motor/sensory deficits  LABORATORY DATA:  I have reviewed the data as listed CBC Latest Ref Rng & Units 01/20/2021  01/19/2021 01/18/2021  WBC 4.0 - 10.5 K/uL 9.5 8.3 13.0(H)  Hemoglobin 12.0 - 15.0 g/dL 9.0(L) 9.2(L) 10.5(L)  Hematocrit 36.0 - 46.0 % 28.4(L) 28.8(L) 32.7(L)  Platelets 150 - 400 K/uL 322 327 431(H)    CMP Latest Ref Rng & Units 01/20/2021 01/19/2021 01/18/2021  Glucose 70 - 99 mg/dL 91 95 119(H)  BUN 8 - 23 mg/dL <5(L) 7(L) 10  Creatinine 0.44 - 1.00 mg/dL 0.50 0.36(L) 0.66  Sodium 135 - 145 mmol/L 132(L) 132(L) 132(L)  Potassium 3.5 - 5.1 mmol/L 3.7 3.6 4.0  Chloride 98 - 111 mmol/L 104 100 98  CO2 22 - 32 mmol/L 21(L) 24 25  Calcium 8.9 - 10.3 mg/dL 8.0(L) 8.1(L) 9.0  Total Protein 6.5 - 8.1 g/dL 5.6(L) 6.0(L) 7.1  Total Bilirubin 0.3 - 1.2 mg/dL 1.4(H) 2.0(H) 0.8  Alkaline Phos 38 - 126 U/L 357(H) 368(H) 443(H)  AST 15 - 41 U/L 147(H) 220(H) 246(HH)  ALT 0 - 44 U/L 153(H) 188(H) 234(H)    PATHOLOGY: SURGICAL PATHOLOGY  CASE: [id number]  A. LIVER, RIGHT, MASS, BIOPSY:  - Metastatic adenocarcinoma consistent with colorectal origin.   COMMENT:  Sections demonstrate metastatic moderately differentiated adenocarcinoma  with dirty necrosis and extracellular mucin involving hepatic  parenchyma. A limited panel of immunohistochemical stains was performed  and the adenocarcinoma is positive for cytokeratin 20 and CDX-2 while  negative for cytokeratin 7. The morphologic findings in conjunction with  the pattern of immunohistochemical staining is consistent with  metastatic adenocarcinoma of colorectal origin.   RADIOGRAPHIC STUDIES: I have personally reviewed the radiological images as listed and agreed with the findings in the report. CT Chest W Contrast  Result Date: 01/11/2021 CLINICAL DATA:  Staging for metastatic disease in the abdomen/pelvis EXAM: CT CHEST WITH CONTRAST TECHNIQUE: Multidetector CT imaging of the chest was performed during intravenous contrast administration. CONTRAST:  40m OMNIPAQUE IOHEXOL 350 MG/ML SOLN COMPARISON:  None. FINDINGS: Cardiovascular:  No significant vascular findings. Normal heart size. No pericardial effusion. Mediastinum/Nodes: No enlarged mediastinal, hilar, or axillary lymph nodes. Thyroid gland, trachea, and esophagus demonstrate no significant findings. Lungs/Pleura: Trachea and central bronchi are patent. No endobronchial lesion. Biapical pleural/parenchymal scarring. 2 mm nodular density in the anterior aspect of the right upper lobe (status 7 image 47). 3 mm nodular density in the posterior right lower lobe (series 7 image 80). There is a 6 mm density in the medial aspect of the left lower lobe  abutting the pleura (series 7 image 122). Another density on the same image measuring 1.0 x 0.3 cm. Another density in the posterolateral aspect of the left lower lobe (image 115) measuring approximately 0.8 x 0.9 cm. And other nodular density in the left lower lobe on image 92 measuring approximately 4 mm. Upper Abdomen: There is a ill-defined hypodense lesion in the right hepatic lobe on series 2 image 136, measures at least 5.5 x 4.0 cm. Few other small hypodense lesions in the liver. Musculoskeletal: No chest wall abnormality. No acute or significant osseous findings. IMPRESSION: 1. Multiple small pulmonary densities in the bilateral lower lobes, of indeterminate etiology, this may represent atelectasis or metastatic disease. Short-term follow-up examination in 3-6 months is recommended. 2. Ill-defined hypodense lesion in the liver measuring at least 5.5 x 4.0 cm as well as few small hypodense lesions concerning for malignant process. 3.  No mediastinal lymphadenopathy. 4.  No suspicious osseous lesion. Electronically Signed   By: Keane Police D.O.   On: 01/11/2021 15:01   CT Abdomen Pelvis W Contrast  Result Date: 01/18/2021 CLINICAL DATA:  Concern for abdominal abscess or infection. History of metastatic colon cancer. Status post core biopsy of right hepatic mass. EXAM: CT ABDOMEN AND PELVIS WITH CONTRAST TECHNIQUE: Multidetector CT  imaging of the abdomen and pelvis was performed using the standard protocol following bolus administration of intravenous contrast. CONTRAST:  41m OMNIPAQUE IOHEXOL 350 MG/ML SOLN COMPARISON:  None. FINDINGS: Lower chest: Bibasilar subpleural atelectasis/scarring. A 7 mm nodular density in the left lower lobe most consistent with metastatic disease. No intra-abdominal free air.  Small free fluid in the pelvis. Hepatobiliary: Multiple hepatic hypodense lesions consistent with metastatic disease. There is a 5.3 x 5.1 cm hypoenhancing area in the right lobe of the liver with small pockets of air corresponding to the biopsied lesion. This likely represent an infiltrative mass or metastasis. An infectious process or developing abscess is less likely but not excluded. No drainable fluid collection identified. No calcified gallstone. There is dilatation of the intrahepatic and extrahepatic biliary tree. The common bile duct measures 17 mm in diameter. There is somewhat narrowing of the central CBD which is suboptimally evaluated on this CT but may be related to mass effect and compression by adjacent adenopathy. Further evaluation with MRI/MRCP is recommended. Pancreas: There is a 12 x 10 mm low attenuating nodule in the body of the pancreas (31/2). Several additional low attenuating lesions in the region of the head of the pancreas noted which may represent peripancreatic adenopathy or metastatic disease. No active inflammatory changes of the pancreas. No gland atrophy or dilatation of the main pancreatic duct. Spleen: Normal in size without focal abnormality. Adrenals/Urinary Tract: The adrenal glands are unremarkable. The kidneys, visualized ureters, and urinary bladder appear unremarkable. Stomach/Bowel: There is sigmoid diverticulosis without active inflammatory changes. There is moderate stool throughout the colon. There is thickened and irregular appearance of the wall of the ascending colon in the region of the  hepatic flexure likely representing known colonic mass/malignancy. There is no bowel obstruction. The appendix is normal. Vascular/Lymphatic: Mild aortoiliac atherosclerotic disease. The IVC is unremarkable. No portal venous gas. Retroperitoneal adenopathy. Reproductive: The uterus is grossly unremarkable. Other: Scattered omental implants consistent with metastatic disease. The largest implant in the left lower quadrant measures approximately 17 x 20 mm (65/2). Musculoskeletal: Degenerative changes of the spine. No acute osseous pathology. IMPRESSION: 1. Thickened and irregular appearance of the wall of the ascending colon in the region of the  hepatic flexure likely representing known colonic mass/malignancy. 2. Multiple hepatic hypodense lesions consistent with metastatic disease. The largest lesion in the right lobe of the liver has been biopsied. No drainable fluid collection/abscess. 3. Dilated intrahepatic and extrahepatic biliary tree with findings concerning for narrowing of the central CBD. Further evaluation with MRI/MRCP is recommended. 4. Scattered omental implants consistent with metastatic disease. 5. Left lower lobe pulmonary nodule/metastatic disease. 6. Sigmoid diverticulosis. No bowel obstruction. Normal appendix. 7. Aortic Atherosclerosis (ICD10-I70.0). Electronically Signed   By: Anner Crete M.D.   On: 01/18/2021 21:04   MR SACRUM SI JOINTS W WO CONTRAST  Result Date: 01/10/2021 CLINICAL DATA:  Suspected bone lesion seen on prior CT examination EXAM: MRI SACRUM WITH AND WITHOUT CONTRAST TECHNIQUE: Multiplanar multi-sequence MR imaging of the sacrum was performed without and with intravenous contrast. COMPARISON:  None. CT examination dated December 29, 2020 FINDINGS: Bones/Joint/Cartilage No fracture or dislocation. Normal alignment. No joint effusion. No marrow signal abnormality. No SI joint widening or erosive changes. No subchondral reactive marrow changes. No SI joint effusion.  There is a T1 hypointense, T2 hyperintense nonenhancing structure abutting the left dorsal root of S2 measuring approximately 1.3 x 1.6 x 1.7 cm, most consistent with a Tarlov cyst, a benign process. No focal lumbar spine abnormality. Degenerate disc disease of the L5-S1. Ligaments, Muscles and Tendons Muscles are normal. No muscle atrophy. No muscle edema. Piriformis muscles are normal bilaterally without signal abnormality. Soft tissue No fluid collection or hematoma. No soft tissue mass. Normal neurovascular bundles. Visualized pelvic viscera are unremarkable. IMPRESSION: 1.  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 2.  No suspicious osseous lesion. Electronically Signed   By: Keane Police D.O.   On: 01/10/2021 17:50   MR 3D Recon At Scanner  Result Date: 01/19/2021 CLINICAL DATA:  Right upper quadrant abdominal pain, nondiagnostic ultrasound. EXAM: MRI ABDOMEN WITHOUT AND WITH CONTRAST (INCLUDING MRCP) TECHNIQUE: Multiplanar multisequence MR imaging of the abdomen was performed both before and after the administration of intravenous contrast. Heavily T2-weighted images of the biliary and pancreatic ducts were obtained, and three-dimensional MRCP images were rendered by post processing. CONTRAST:  57m GADAVIST GADOBUTROL 1 MMOL/ML IV SOLN COMPARISON:  01/18/2021. FINDINGS: Lower chest: Atelectasis or infiltrate is noted at the lung bases. There is redemonstration of a nodular lesion in the left lower lobe measuring 8 mm. Trace bilateral pleural effusions are present. Hepatobiliary: There is a heterogeneous ill-defined masslike lesion with rim enhancement in the posterior right lobe of the liver measuring 5.6 x 5.6 cm. Additional smaller T2 hyperintensities are present in the right lobe of the liver with rim enhancement. Intrahepatic and extrahepatic biliary ductal dilatation is noted. The proximal common bile duct is distended measuring 1.7 cm in diameter. There is segmental narrowing with  smooth shouldering of the mid common bile duct, which may be related to extrinsic compression from lymphadenopathy. No enhancing mass is identified in this region. The distal common bile duct is normal in caliber. No choledocholithiasis. The gallbladder is contracted and contains T1 hyperintense material, possible sludge. Pancreas: The pancreas enhances normally. The previously described low-attenuation nodule in the body of the pancreas is not well seen on this exam. No significant pancreatic ductal dilatation. Spleen:  Within normal limits in size and appearance. Adrenals/Urinary Tract: The adrenal glands are within normal limits. No evidence of hydronephrosis. A few subcentimeter T2 hyperintensities are seen in the kidneys which are too small to further characterize and may represent cysts. Stomach/Bowel:  No bowel obstruction. There is focal colonic wall narrowing with wall thickening and enhancement involving the hepatic flexure, which may represent patient's known mass. A moderate amount of retained stool is present in the colon. Vascular/Lymphatic: Multiple prominent lymph nodes are seen in the gastrohepatic ligament, porta hepatis, and retroperitoneum. There are nodules with restricted diffusion in the anterior peritoneum, concerning for metastatic disease. The aorta is normal in caliber. The splenic vein, portal vein, and superior mesenteric vein are patent. Other:  No free fluid. Musculoskeletal: No suspicious bone lesions identified. IMPRESSION: 1. Multiple rim enhancing lesions in the liver, the largest measuring 5.6 x 5.6 cm, suggesting metastatic disease. Given history of recent biopsy, correlation with biopsy results is recommended. 2. Intrahepatic and extrahepatic biliary ductal dilatation. There is smooth shouldering of the mid common bile duct with segmental narrowing which may be due to extrinsic mass effect from local lymphadenopathy. No definite infiltrative mass is seen. 3. Enhancing mass at the  colonic hepatic flexure, may represent patient's known colon cancer. 4. Multiple enlarged lymph nodes in the porta hepatis, gastrohepatic ligament, and retroperitoneum. There also nodules with restricted diffusion in the anterior peritoneum, concerning for peritoneal metastasis. 5. Left lower lobe pulmonary nodule, possible metastatic disease. Electronically Signed   By: Brett Fairy M.D.   On: 01/19/2021 04:22   IR US Guide Bx Asp/Drain  Result Date: 01/17/2021 INDICATION: HEPATIC METASTATIC DISEASE. EXAM: ULTRASOUND CORE BIOPSY RIGHT HEPATIC MASS MEDICATIONS: 1% LIDOCAINE ANESTHESIA/SEDATION: Moderate (conscious) sedation was employed during this procedure. A total of Versed 2.0 mg and Fentanyl 100 mcg was administered intravenously by the radiology nurse. Total intra-service moderate Sedation Time: 10 minutes. The patient's level of consciousness and vital signs were monitored continuously by radiology nursing throughout the procedure under my direct supervision. FLUOROSCOPY TIME:  Fluoroscopy Time: None. COMPLICATIONS: None immediate. PROCEDURE: Informed written consent was obtained from the patient after a thorough discussion of the procedural risks, benefits and alternatives. All questions were addressed. Maximal Sterile Barrier Technique was utilized including caps, mask, sterile gowns, sterile gloves, sterile drape, hand hygiene and skin antiseptic. A timeout was performed prior to the initiation of the procedure. Previous imaging reviewed. Preliminary ultrasound performed. Right hepatic lesion was localized and marked for a mid axillary line approach through a lower intercostal space. Under sterile conditions and local anesthesia, a 17 gauge 11.8 cm guide was advanced to the lesion. Needle position confirmed with ultrasound. Images obtained for documentation. 18 gauge core biopsies obtained under direct ultrasound. Samples were intact and non fragmented. These were placed in formalin. Needle tract  occluded with Gel-Foam. Postprocedure imaging demonstrates no hemorrhage or hematoma. Patient tolerated biopsy well. IMPRESSION: Successful ultrasound right hepatic mass 18 gauge core biopsy Electronically Signed   By: Jerilynn Mages.  Shick M.D.   On: 01/17/2021 14:56   MR ABDOMEN MRCP W WO CONTAST  Result Date: 01/19/2021 CLINICAL DATA:  Right upper quadrant abdominal pain, nondiagnostic ultrasound. EXAM: MRI ABDOMEN WITHOUT AND WITH CONTRAST (INCLUDING MRCP) TECHNIQUE: Multiplanar multisequence MR imaging of the abdomen was performed both before and after the administration of intravenous contrast. Heavily T2-weighted images of the biliary and pancreatic ducts were obtained, and three-dimensional MRCP images were rendered by post processing. CONTRAST:  36m GADAVIST GADOBUTROL 1 MMOL/ML IV SOLN COMPARISON:  01/18/2021. FINDINGS: Lower chest: Atelectasis or infiltrate is noted at the lung bases. There is redemonstration of a nodular lesion in the left lower lobe measuring 8 mm. Trace bilateral pleural effusions are present. Hepatobiliary: There is a heterogeneous ill-defined masslike lesion  with rim enhancement in the posterior right lobe of the liver measuring 5.6 x 5.6 cm. Additional smaller T2 hyperintensities are present in the right lobe of the liver with rim enhancement. Intrahepatic and extrahepatic biliary ductal dilatation is noted. The proximal common bile duct is distended measuring 1.7 cm in diameter. There is segmental narrowing with smooth shouldering of the mid common bile duct, which may be related to extrinsic compression from lymphadenopathy. No enhancing mass is identified in this region. The distal common bile duct is normal in caliber. No choledocholithiasis. The gallbladder is contracted and contains T1 hyperintense material, possible sludge. Pancreas: The pancreas enhances normally. The previously described low-attenuation nodule in the body of the pancreas is not well seen on this exam. No  significant pancreatic ductal dilatation. Spleen:  Within normal limits in size and appearance. Adrenals/Urinary Tract: The adrenal glands are within normal limits. No evidence of hydronephrosis. A few subcentimeter T2 hyperintensities are seen in the kidneys which are too small to further characterize and may represent cysts. Stomach/Bowel: No bowel obstruction. There is focal colonic wall narrowing with wall thickening and enhancement involving the hepatic flexure, which may represent patient's known mass. A moderate amount of retained stool is present in the colon. Vascular/Lymphatic: Multiple prominent lymph nodes are seen in the gastrohepatic ligament, porta hepatis, and retroperitoneum. There are nodules with restricted diffusion in the anterior peritoneum, concerning for metastatic disease. The aorta is normal in caliber. The splenic vein, portal vein, and superior mesenteric vein are patent. Other:  No free fluid. Musculoskeletal: No suspicious bone lesions identified. IMPRESSION: 1. Multiple rim enhancing lesions in the liver, the largest measuring 5.6 x 5.6 cm, suggesting metastatic disease. Given history of recent biopsy, correlation with biopsy results is recommended. 2. Intrahepatic and extrahepatic biliary ductal dilatation. There is smooth shouldering of the mid common bile duct with segmental narrowing which may be due to extrinsic mass effect from local lymphadenopathy. No definite infiltrative mass is seen. 3. Enhancing mass at the colonic hepatic flexure, may represent patient's known colon cancer. 4. Multiple enlarged lymph nodes in the porta hepatis, gastrohepatic ligament, and retroperitoneum. There also nodules with restricted diffusion in the anterior peritoneum, concerning for peritoneal metastasis. 5. Left lower lobe pulmonary nodule, possible metastatic disease. Electronically Signed   By: Brett Fairy M.D.   On: 01/19/2021 04:22   IR IMAGING GUIDED PORT INSERTION  Result Date:  01/17/2021 CLINICAL DATA:  Imaging findings consistent with metastatic colon cancer to liver EXAM: RIGHT INTERNAL JUGULAR SINGLE LUMEN POWER PORT CATHETER INSERTION Date:  01/17/2021 01/17/2021 2:30 pm Radiologist:  M. Daryll Brod, MD Guidance:  Ultrasound and fluoroscopic MEDICATIONS: 1% lidocaine local with epinephrine ANESTHESIA/SEDATION: Versed 2.0 mg IV; Fentanyl 100 mcg IV; Moderate Sedation Time:  23 minute The patient was continuously monitored during the procedure by the interventional radiology nurse under my direct supervision. FLUOROSCOPY TIME:  0 minutes, 36 seconds (1 mGy) COMPLICATIONS: None immediate. CONTRAST:  None. PROCEDURE: Informed consent was obtained from the patient following explanation of the procedure, risks, benefits and alternatives. The patient understands, agrees and consents for the procedure. All questions were addressed. A time out was performed. Maximal barrier sterile technique utilized including caps, mask, sterile gowns, sterile gloves, large sterile drape, hand hygiene, and 2% chlorhexidine scrub. Under sterile conditions and local anesthesia, right internal jugular micropuncture venous access was performed. Access was performed with ultrasound. Images were obtained for documentation of the patent right internal jugular vein. A guide wire was inserted followed by a transitional  dilator. This allowed insertion of a guide wire and catheter into the IVC. Measurements were obtained from the SVC / RA junction back to the right IJ venotomy site. In the right infraclavicular chest, a subcutaneous pocket was created over the second anterior rib. This was done under sterile conditions and local anesthesia. 1% lidocaine with epinephrine was utilized for this. A 2.5 cm incision was made in the skin. Blunt dissection was performed to create a subcutaneous pocket over the right pectoralis major muscle. The pocket was flushed with saline vigorously. There was adequate hemostasis. The port  catheter was assembled and checked for leakage. The port catheter was secured in the pocket with two retention sutures. The tubing was tunneled subcutaneously to the right venotomy site and inserted into the SVC/RA junction through a valved peel-away sheath. Position was confirmed with fluoroscopy. Images were obtained for documentation. The patient tolerated the procedure well. No immediate complications. Incisions were closed in a two layer fashion with 4 - 0 Vicryl suture. Dermabond was applied to the skin. The port catheter was accessed, blood was aspirated followed by saline and heparin flushes. Needle was removed. A dry sterile dressing was applied. IMPRESSION: Ultrasound and fluoroscopically guided right internal jugular single lumen power port catheter insertion. Tip in the SVC/RA junction. Catheter ready for use. Electronically Signed   By: Jerilynn Mages.  Shick M.D.   On: 01/17/2021 14:53    ASSESSMENT & PLAN Norma Morales is a 75 y.o. 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  The mainstay treatment will be chemotherapy with the rationale to prolong life and help improve cancer related symptoms.  This is unfortunately not a curable disease.  The proposed treatment regimen includes infusional 5-FU, oxaliplatin and bevacizumab given once every 2 weeks.  The dose, frequency and common side effects were discussed with the patient.  Common side effects include fatigue, appetite loss, nausea, diarrhea, cold sensitivity, neuropathy, poor wound healing and increased risk of bleeding.  We will plan to obtain repeat imaging in 2 to 3 months to assess treatment response.  Additionally, we have ordered additional biomarkers including KRAS, NRAS, BRAS and MSI/MMR. Patient is scheduled for chemoeducation on 01/25/2021 and tenative start date for chemotherapy on 01/26/2021.    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  --Recommended treatment regimen includes FOLFOX plus Bevacizumab, tentative start date on 01/26/2021. --RTC on 02/09/2021 to see Dr. Lorenso Courier prior to Cycle 2, Day 1.   #RUQ abdominal pain: --Likely secondary to liver metastases --Currently on oxycodone 5 mg q 8 hours with improvement of pain. Refill sent.  --Advised patient to be aggressive with her bowel regimen to minimize constipation.   #Supportive care: --Port has been placed. EMLA cream ordered.  --Referral to nutrition has been placed --Zofran and Compazine prescriptions sent   Orders Placed This Encounter  Procedures   Ambulatory Referral to [facility] Nutrition    Referral Priority:   Urgent    Referral Type:   Consultation    Referral Reason:   Specialty Services Required    Number of Visits Requested:   1    All questions were answered. The patient knows to call the clinic with any problems, questions or concerns.  I have spent a total of 45 minutes minutes of face-to-face and non-face-to-face time, preparing to see the patient, obtaining and/or reviewing  separately obtained history, performing a medically appropriate examination, counseling and educating the patient, ordering medications, referring and communicating with other health care professionals, documenting clinical information in the electronic health record, and care coordination.   Dede Query, PA-C Department of Hematology/Oncology [place] at [facility] Phone: [phone]  Patient was seen with Dr. Lorenso Courier.   I have read the above note and personally examined the patient. I agree with the assessment and plan as noted above.  Norma Morales is a 75 year old female who presents for evaluation of newly diagnosed metastatic adenocarcinoma of the colon.  Today we  discussed chemotherapy options moving forward.  Concern for rising LFTs for biliary obstruction versus liver destruction from metastatic spread.  We will plan to start chemotherapy with FOLFOX bevacizumab as soon as is feasible.  Additionally we have sent off molecular studies on the patient's tumor for future treatment options.   Ledell Peoples, MD Department of Hematology/Oncology [place] at [facility] Phone: [phone] Pager: [phone] Email: [email or web address]

## 2021-01-23 NOTE — Telephone Encounter (Signed)
Sch per 11/28 inbasket, pt aware 

## 2021-01-24 ENCOUNTER — Telehealth: Payer: Self-pay | Admitting: *Deleted

## 2021-01-24 ENCOUNTER — Inpatient Hospital Stay: Payer: Medicare HMO | Admitting: Nutrition

## 2021-01-24 ENCOUNTER — Telehealth: Payer: Self-pay | Admitting: Nutrition

## 2021-01-24 NOTE — Progress Notes (Signed)
See telephone note.

## 2021-01-24 NOTE — Telephone Encounter (Signed)
Telephone consult completed with patient.  75 year old female diagnosed with metastatic colorectal cancer followed by Dr. Lorenso Courier.  Past medical history includes hyperlipidemia.  Medications include omega-3 fatty acids, Zofran, oxycodone, and Compazine.  Patient reports she has discontinued vitamin C, B complex vitamin, calcium, co-Q10, vitamin D, and zinc.  She has added a liquid multivitamin.  Labs include hemoglobin 9.0, BUN less than 5, sodium 132.  Height: 5 feet 3 inches. Weight: 145.5 pounds November 28. Usual body weight: ~150 pounds per patient. BMI: 25.77.  Patient reports she continues to be constipated and has not had a bowel movement since last Friday.  She has been taking 2 senna tablets daily with no results so far. She has very poor energy. She has a fair appetite. She reports that she has been consuming mostly liquid diet including 2 Premier protein drinks a day and water.  States she had a piece of toast this morning.  She has been afraid to eat solid food. She denies nausea and vomiting.  Nutrition diagnosis:  Unintended weight loss related to metastatic colorectal cancer as evidenced by 5 pound weight loss from usual body weight and less than adequate calorie and protein intake per dietary recall  Intervention: Educated patient on the importance of increasing calories in small frequent meals and snacks.  Recommended patient choose soft foods every 2 hours.  Reviewed specific foods for patient to add to her meals. Recommended milkshakes made with Premier protein to add extra calories. Continue bowel regimen.  Contacted MD nurse to call patient and advise on bowel regimen. Mail nutrition facts sheets on soft diet, high-calorie high-protein foods. Questions answered.  Teach back method used.  Contact information provided.  Monitoring, evaluation, goals: Patient will tolerate increased calories and protein by advancing to soft diet.  Next visit: To be scheduled with  treatment.  **Disclaimer: This note was dictated with voice recognition software. Similar sounding words can inadvertently be transcribed and this note may contain transcription errors which may not have been corrected upon publication of note.**

## 2021-01-24 NOTE — Telephone Encounter (Signed)
RN asked by B. Cordella Register, dietician to contact patient regarding constipation issues.  PC to patient, she states she has been taking Senna two tablets every day, has not had a BM in several days, denies any abdominal pain or discomfort, also states she has not been eating very much.  Informed patient she make take Senna 3 tablets tonight at bedtime & may repeat this dose in the morning if no results.  Patient verbalizes understanding.

## 2021-01-25 ENCOUNTER — Telehealth: Payer: Self-pay | Admitting: Hematology and Oncology

## 2021-01-25 ENCOUNTER — Inpatient Hospital Stay: Payer: Medicare HMO

## 2021-01-25 ENCOUNTER — Other Ambulatory Visit: Payer: Self-pay

## 2021-01-25 DIAGNOSIS — C799 Secondary malignant neoplasm of unspecified site: Secondary | ICD-10-CM

## 2021-01-25 NOTE — Telephone Encounter (Signed)
Scheduled per 11/28 los, patient received updated calender.

## 2021-01-26 ENCOUNTER — Inpatient Hospital Stay: Payer: Medicare Other

## 2021-01-26 ENCOUNTER — Inpatient Hospital Stay (HOSPITAL_BASED_OUTPATIENT_CLINIC_OR_DEPARTMENT_OTHER): Payer: Medicare Other | Admitting: Hematology and Oncology

## 2021-01-26 ENCOUNTER — Telehealth: Payer: Self-pay | Admitting: Family Medicine

## 2021-01-26 ENCOUNTER — Other Ambulatory Visit: Payer: Self-pay

## 2021-01-26 ENCOUNTER — Telehealth: Payer: Self-pay

## 2021-01-26 ENCOUNTER — Inpatient Hospital Stay (HOSPITAL_COMMUNITY)
Admission: AD | Admit: 2021-01-26 | Discharge: 2021-01-28 | DRG: 445 | Disposition: A | Discharge status: Home / Self Care | Payer: Medicare Other | Attending: Internal Medicine | Admitting: Internal Medicine

## 2021-01-26 ENCOUNTER — Inpatient Hospital Stay (HOSPITAL_COMMUNITY): Payer: Medicare Other

## 2021-01-26 ENCOUNTER — Inpatient Hospital Stay: Payer: Medicare Other | Attending: Physician Assistant

## 2021-01-26 ENCOUNTER — Encounter: Payer: Self-pay | Admitting: Hematology and Oncology

## 2021-01-26 ENCOUNTER — Encounter (HOSPITAL_COMMUNITY): Payer: Self-pay | Admitting: Family Medicine

## 2021-01-26 DIAGNOSIS — R7989 Other specified abnormal findings of blood chemistry: Secondary | ICD-10-CM | POA: Insufficient documentation

## 2021-01-26 DIAGNOSIS — Z8249 Family history of ischemic heart disease and other diseases of the circulatory system: Secondary | ICD-10-CM

## 2021-01-26 DIAGNOSIS — T451X5A Adverse effect of antineoplastic and immunosuppressive drugs, initial encounter: Secondary | ICD-10-CM | POA: Diagnosis present

## 2021-01-26 DIAGNOSIS — Z79899 Other long term (current) drug therapy: Secondary | ICD-10-CM | POA: Insufficient documentation

## 2021-01-26 DIAGNOSIS — R053 Chronic cough: Secondary | ICD-10-CM

## 2021-01-26 DIAGNOSIS — I471 Supraventricular tachycardia: Secondary | ICD-10-CM | POA: Diagnosis not present

## 2021-01-26 DIAGNOSIS — C189 Malignant neoplasm of colon, unspecified: Secondary | ICD-10-CM | POA: Diagnosis present

## 2021-01-26 DIAGNOSIS — Z886 Allergy status to analgesic agent status: Secondary | ICD-10-CM

## 2021-01-26 DIAGNOSIS — R591 Generalized enlarged lymph nodes: Secondary | ICD-10-CM | POA: Diagnosis present

## 2021-01-26 DIAGNOSIS — R160 Hepatomegaly, not elsewhere classified: Secondary | ICD-10-CM | POA: Diagnosis not present

## 2021-01-26 DIAGNOSIS — Z95828 Presence of other vascular implants and grafts: Secondary | ICD-10-CM | POA: Insufficient documentation

## 2021-01-26 DIAGNOSIS — K831 Obstruction of bile duct: Secondary | ICD-10-CM | POA: Diagnosis present

## 2021-01-26 DIAGNOSIS — C787 Secondary malignant neoplasm of liver and intrahepatic bile duct: Secondary | ICD-10-CM | POA: Diagnosis present

## 2021-01-26 DIAGNOSIS — D72829 Elevated white blood cell count, unspecified: Secondary | ICD-10-CM | POA: Diagnosis present

## 2021-01-26 DIAGNOSIS — R17 Unspecified jaundice: Secondary | ICD-10-CM

## 2021-01-26 DIAGNOSIS — R7401 Elevation of levels of liver transaminase levels: Secondary | ICD-10-CM | POA: Diagnosis present

## 2021-01-26 DIAGNOSIS — E785 Hyperlipidemia, unspecified: Secondary | ICD-10-CM | POA: Diagnosis present

## 2021-01-26 DIAGNOSIS — Z20822 Contact with and (suspected) exposure to covid-19: Secondary | ICD-10-CM | POA: Diagnosis present

## 2021-01-26 DIAGNOSIS — D6481 Anemia due to antineoplastic chemotherapy: Secondary | ICD-10-CM | POA: Diagnosis present

## 2021-01-26 DIAGNOSIS — R059 Cough, unspecified: Secondary | ICD-10-CM | POA: Diagnosis present

## 2021-01-26 DIAGNOSIS — C786 Secondary malignant neoplasm of retroperitoneum and peritoneum: Secondary | ICD-10-CM | POA: Diagnosis present

## 2021-01-26 DIAGNOSIS — K5900 Constipation, unspecified: Secondary | ICD-10-CM | POA: Insufficient documentation

## 2021-01-26 DIAGNOSIS — R1011 Right upper quadrant pain: Secondary | ICD-10-CM | POA: Insufficient documentation

## 2021-01-26 DIAGNOSIS — Z5111 Encounter for antineoplastic chemotherapy: Secondary | ICD-10-CM | POA: Insufficient documentation

## 2021-01-26 DIAGNOSIS — Z5112 Encounter for antineoplastic immunotherapy: Secondary | ICD-10-CM | POA: Insufficient documentation

## 2021-01-26 LAB — CMP (CANCER CENTER ONLY)
ALT: 282 U/L (ref 0–44)
AST: 252 U/L (ref 15–41)
Albumin: 2.7 g/dL — ABNORMAL LOW (ref 3.5–5.0)
Alkaline Phosphatase: 666 U/L — ABNORMAL HIGH (ref 38–126)
Anion gap: 12 (ref 5–15)
BUN: 9 mg/dL (ref 8–23)
CO2: 25 mmol/L (ref 22–32)
Calcium: 9 mg/dL (ref 8.9–10.3)
Chloride: 98 mmol/L (ref 98–111)
Creatinine: 0.65 mg/dL (ref 0.44–1.00)
GFR, Estimated: >60 mL/min (ref >60)
Glucose, Bld: 133 mg/dL — ABNORMAL HIGH (ref 70–99)
Potassium: 3.7 mmol/L (ref 3.5–5.1)
Sodium: 135 mmol/L (ref 135–145)
Total Bilirubin: 9 mg/dL (ref 0.3–1.2)
Total Protein: 6.8 g/dL (ref 6.5–8.1)

## 2021-01-26 LAB — CBC WITH DIFFERENTIAL (CANCER CENTER ONLY)
Abs Immature Granulocytes: 0.06 10*3/uL (ref 0.00–0.07)
Basophils Absolute: 0.1 10*3/uL (ref 0.0–0.1)
Basophils Relative: 0 %
Eosinophils Absolute: 0 10*3/uL (ref 0.0–0.5)
Eosinophils Relative: 0 %
HCT: 29.4 % — ABNORMAL LOW (ref 36.0–46.0)
Hemoglobin: 9.8 g/dL — ABNORMAL LOW (ref 12.0–15.0)
Immature Granulocytes: 0 %
Lymphocytes Relative: 4 %
Lymphs Abs: 0.6 10*3/uL — ABNORMAL LOW (ref 0.7–4.0)
MCH: 26.2 pg (ref 26.0–34.0)
MCHC: 33.3 g/dL (ref 30.0–36.0)
MCV: 78.6 fL — ABNORMAL LOW (ref 80.0–100.0)
Monocytes Absolute: 1.4 10*3/uL — ABNORMAL HIGH (ref 0.1–1.0)
Monocytes Relative: 9 %
Neutro Abs: 12.5 10*3/uL — ABNORMAL HIGH (ref 1.7–7.7)
Neutrophils Relative %: 87 %
Platelet Count: 505 10*3/uL — ABNORMAL HIGH (ref 150–400)
RBC: 3.74 MIL/uL — ABNORMAL LOW (ref 3.87–5.11)
RDW: 15 % (ref 11.5–15.5)
WBC Count: 14.7 10*3/uL — ABNORMAL HIGH (ref 4.0–10.5)
nRBC: 0 % (ref 0.0–0.2)

## 2021-01-26 IMAGING — CR DG CHEST 2V
2 series · 2 of 2 positions shown · non-contrast
Comparison: CT chest [DATE]. CT abdomen and pelvis [DATE].

CLINICAL DATA: Cough for 2 days.

EXAM:
CHEST - 2 VIEW

[w chest pa]
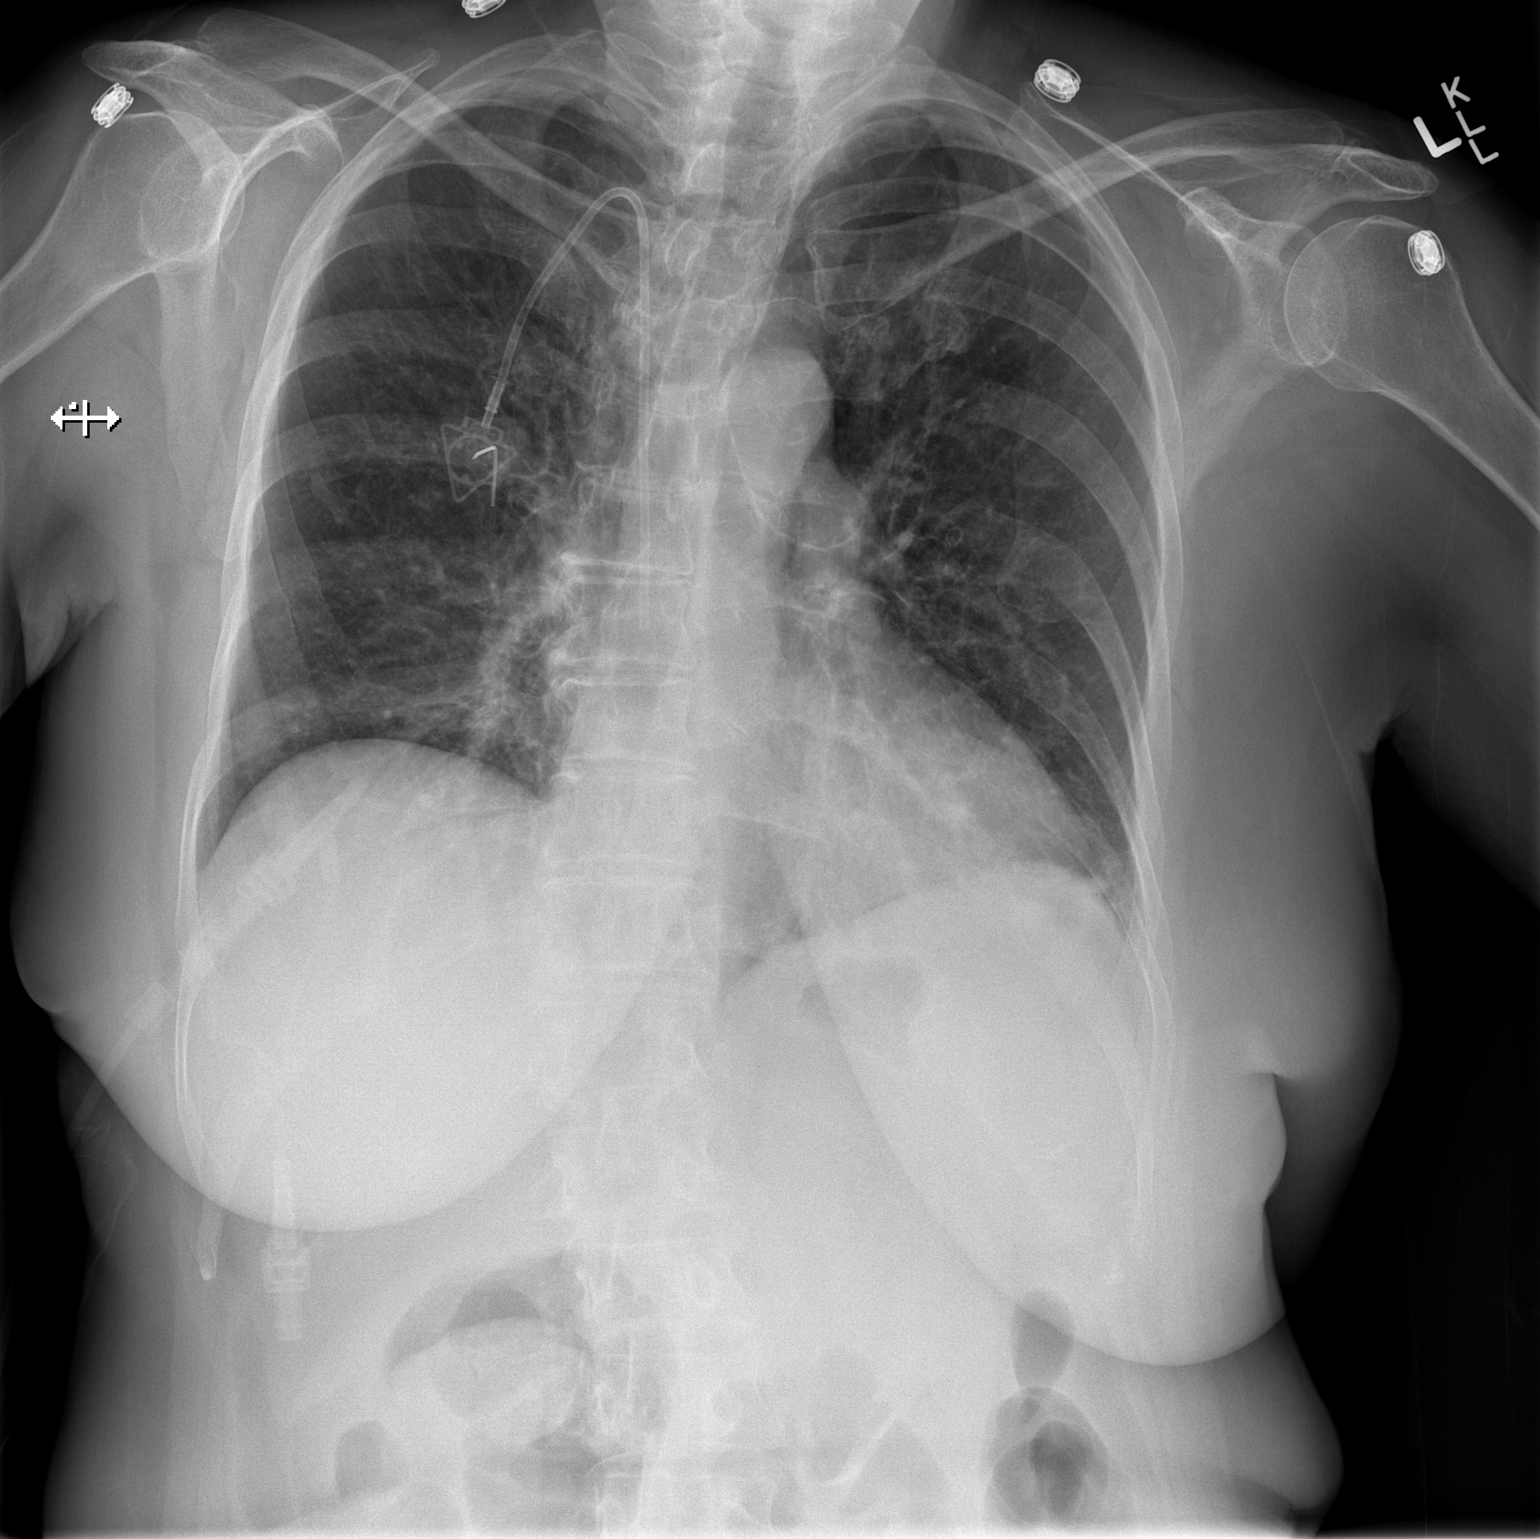

[w chest lat]
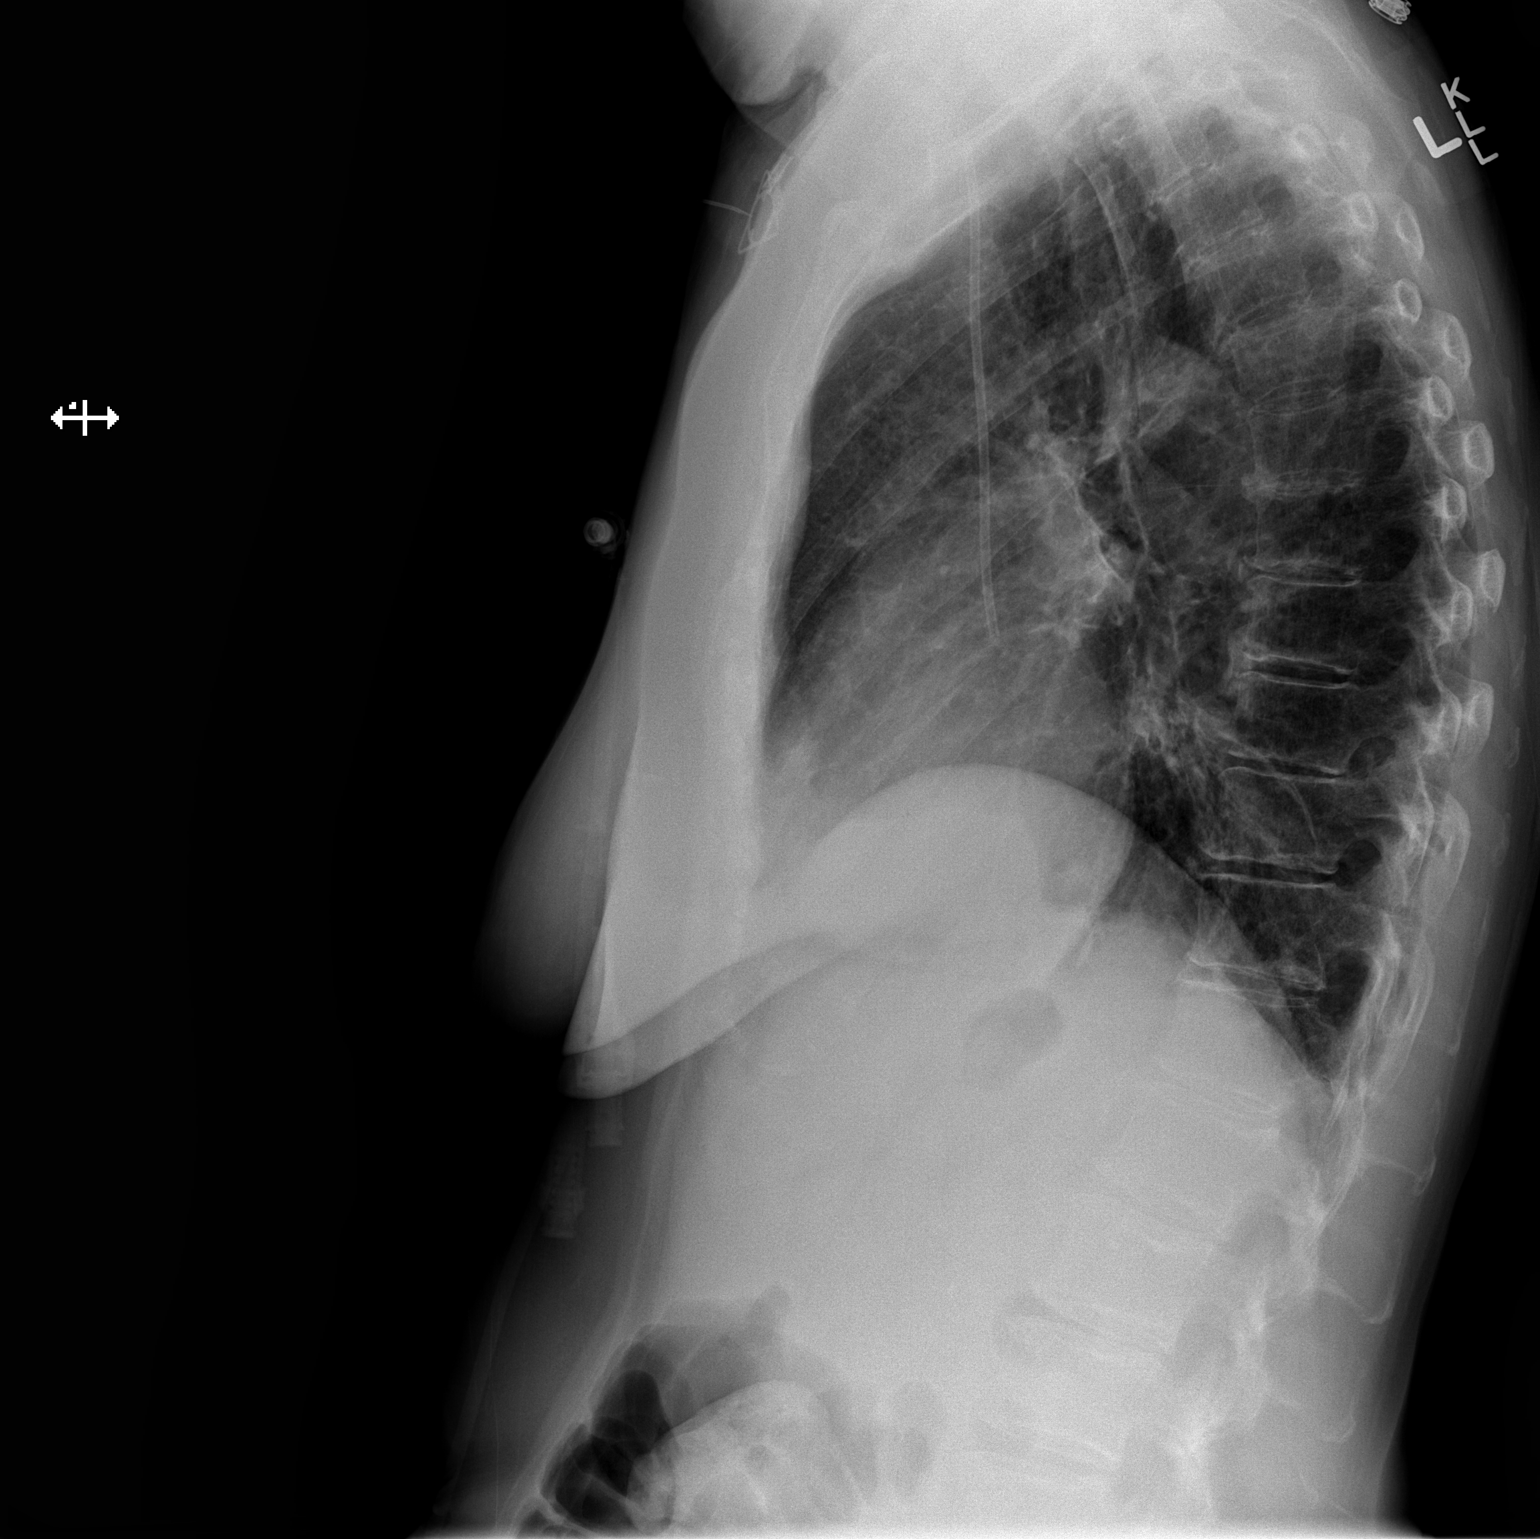

[2 of 2 positions shown; findings below may reference images not displayed]

FINDINGS: Linear nodular densities in the left lung base are unchanged. There
is no focal lung consolidation, pleural effusion or pneumothorax.
The heart is normal in size. Right chest port catheter tip projects
over the mid SVC. No acute fractures are identified.
IMPRESSION: 1. Stable nodular and linear densities in the left lung base.
2. No new focal lung infiltrate.

## 2021-01-26 MED ORDER — SODIUM CHLORIDE 0.9% FLUSH
10.0000 mL | Freq: Once | INTRAVENOUS | Status: AC
  Administered 2021-01-26: 10 mL

## 2021-01-26 MED ORDER — SENNA 8.6 MG PO TABS
1.0000 | ORAL_TABLET | Freq: Two times a day (BID) | ORAL | Status: DC
  Administered 2021-01-26 – 2021-01-28 (×4): 8.6 mg via ORAL
  Filled 2021-01-26 (×4): qty 1

## 2021-01-26 MED ORDER — SODIUM CHLORIDE 0.9 % IV SOLN: INTRAVENOUS | Status: DC

## 2021-01-26 MED ORDER — PIPERACILLIN-TAZOBACTAM 3.375 G IVPB
3.3750 g | Freq: Three times a day (TID) | INTRAVENOUS | Status: DC
  Administered 2021-01-26 – 2021-01-28 (×4): 3.375 g via INTRAVENOUS
  Filled 2021-01-26 (×7): qty 50

## 2021-01-26 MED ORDER — HEPARIN SODIUM (PORCINE) 5000 UNIT/ML IJ SOLN
5000.0000 [IU] | Freq: Three times a day (TID) | INTRAMUSCULAR | Status: DC
  Administered 2021-01-27 – 2021-01-28 (×3): 5000 [IU] via SUBCUTANEOUS
  Filled 2021-01-26 (×5): qty 1

## 2021-01-26 MED ORDER — ONDANSETRON HCL 4 MG PO TABS: 4.0000 mg | ORAL_TABLET | Freq: Four times a day (QID) | ORAL | Status: DC | PRN

## 2021-01-26 MED ORDER — ONDANSETRON HCL 4 MG/2ML IJ SOLN: 4.0000 mg | Freq: Four times a day (QID) | INTRAMUSCULAR | Status: DC | PRN

## 2021-01-26 MED ORDER — BENZONATATE 100 MG PO CAPS
100.0000 mg | ORAL_CAPSULE | Freq: Three times a day (TID) | ORAL | Status: DC
  Administered 2021-01-26 – 2021-01-28 (×5): 100 mg via ORAL
  Filled 2021-01-26 (×5): qty 1

## 2021-01-26 MED ORDER — ZOLPIDEM TARTRATE 5 MG PO TABS
5.0000 mg | ORAL_TABLET | Freq: Every evening | ORAL | Status: DC | PRN
  Filled 2021-01-26: qty 1

## 2021-01-26 MED ORDER — OXYCODONE HCL 5 MG PO TABS
5.0000 mg | ORAL_TABLET | Freq: Four times a day (QID) | ORAL | Status: DC | PRN
  Administered 2021-01-26 – 2021-01-28 (×6): 5 mg via ORAL
  Filled 2021-01-26 (×6): qty 1

## 2021-01-26 NOTE — Assessment & Plan Note (Addendum)
Along with hyperbilirubinemia. LFT has been chronically elevated but now mildly worsened. No evidence of cholangitis on exam. Continue IV hydration.  Avoid hepatotoxic medication.

## 2021-01-26 NOTE — Telephone Encounter (Signed)
75 yo with metastatic colon cancer who presented to see Dr. Lorenso Courier today for chemotherapy.  Labs notably with worsening liver function, bili 9, AST 252, ALT 282, alk phos 666.  Dr. Lorenso Courier called requesting admission for workup and eval.  She's stable per discussion with him, BP 124/59, RR 18, afebrile, HR 87.  Labs otherwise notable for leukocytosis, anemia, thrombocytosis.  Accepted to tele or medsurg bed for additional workup.

## 2021-01-26 NOTE — Progress Notes (Signed)
[place] Telephone:([phone]) [phone]   [phone]  PROGRESS NOTE  Patient Care Team: Jene Every, MD as PCP - General (Family Medicine)  Hematological/Oncological History 1) 12/19/2020: Presented to PCP with RUQ abdominal pain and constipation.    2) 12/28/2020: CT abdomen/pelvis: Short segment colonic wall thickening at the hepatic flexure with pericolonic stranding and nodularity, concerning for primary colonic neoplasm. Recommend correlation with colonoscopy.  Findings of metastatic disease including multifocal omental, mesenteric, and peritoneal/retroperitoneal soft tissue implants, portocaval and retroperitoneal lymphadenopathy and multiple hepatic metastases. Question lucent lesion involving the S2 vertebral body with soft tissue density extending into the adjacent canal and partially expanded left S2 neural foramina. Recommend dedicated MRI of the sacrum for further characterization. Subpleural 0.7 cm nodule in the lingula is indeterminate. Further evaluation with dedicated CT chest is recommended.  A 1.0 cm lesion in the pancreatic neck without pancreatic ductal dilatation. This could be further characterized with MRI of the abdomen.   3) 01/04/2021: Establish care at [facility].   4) 01/06/2021: Underwent colonoscopy with Dr. Kathreen Devoid [facility]).  Findings revealed a circumferential, near obstructing colon mass concerning for malignancy.  Pathology revealed superficial fragments of colonic mucosa with extensive high-grade dysplasia.  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  No suspicious osseous lesion.  6) 01/11/2021: CT chest: Multiple small pulmonary densities in the bilateral lower lobes of indeterminate etiology.  Ill-defined hypodense lesion in the liver measuring at least 5.5 x 4.0 cm as well as few small hypodense lesions concerning for malignant process.  No mediastinal  lymphadenopathy.  No suspicious osseous lesions  7) 01/17/2021: Underwent port placement and liver biopsy.  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  8) 01/26/2021: intended start of FOLFOX +Bevacizumab chemotherapy. Held due to Bilirubin 9.0, AST 252, ALT 282. Admitted to [facility] hospital.   Interval History:  Norma Morales returns for follow-up for metastatic colon cancer.   On exam today, Norma Morales is accompanied by her husband.  She notes that her appetite has been quite poor mostly due to the fact that she has been having difficulty with constipation.  She has been using senna and docusate to try to move her bowels.  She reports that she did have a good bowel movement last night between 12 and 2 AM.  She reports a thick stool.  Her pain is currently under control with her current pain medication.  She denies any nausea or vomiting.  Additionally she has no fevers, chills, sweats.  A full 10 point ROS is listed below.  The bulk of our discussion focused on her marked elevation in LFTs and the need to have her admitted on concern for biliary obstruction.  MEDICAL HISTORY:  Past Medical History:  Diagnosis Date   Cancer ([place])    colon   Hyperlipidemia     SURGICAL HISTORY: Past Surgical History:  Procedure Laterality Date   IR IMAGING GUIDED PORT INSERTION  01/17/2021   IR US GUIDE BX ASP/DRAIN  01/17/2021   TONSILLECTOMY      SOCIAL HISTORY: Social History   Socioeconomic History   Marital status: Married    Spouse name: Not on file   Number of children: Not on file   Years of education: Not on file   Highest education level: Not on file  Occupational History   Not on file  Tobacco Use   Smoking status: Never   Smokeless tobacco: Never  Substance and Sexual Activity   Alcohol use: Never   Drug use: Never   Sexual activity: Not on file  Other Topics Concern   Not on file  Social History Narrative   Not on file   Social  Determinants of Health   Financial Resource Strain: Not on file  Food Insecurity: Not on file  Transportation Needs: Not on file  Physical Activity: Not on file  Stress: Not on file  Social Connections: Not on file  Intimate Partner Violence: Not on file    FAMILY HISTORY: No family history on file.  ALLERGIES:  is allergic to aspirin.  MEDICATIONS:  Current Outpatient Medications  Medication Sig Dispense Refill   alendronate (FOSAMAX) 70 MG tablet Take 70 mg by mouth once a week.     ascorbic acid (VITAMIN C) 500 MG tablet Take 500 mg by mouth daily.     atenolol (TENORMIN) 25 MG tablet Take 25 mg by mouth daily.     b complex vitamins capsule Take 1 capsule by mouth daily.     Calcium Carbonate (CALCIUM 600 PO) Take 600 mg by mouth daily.     Cholecalciferol 25 MCG (1000 UT) capsule Take 1,000 Units by mouth daily.     Coenzyme Q10 (COQ10) 50 MG CAPS Take 50 mg by mouth daily.     ezetimibe (ZETIA) 10 MG tablet Take 10 mg by mouth daily.     Glucosamine-Chondroitin (GLUCOSAMINE CHONDR COMPLEX PO) Take 1 tablet by mouth daily.     lidocaine-prilocaine (EMLA) cream Apply 1 application topically as needed. 30 g 0   Omega-3 1000 MG CAPS Take 1,000 mg by mouth daily.     ondansetron (ZOFRAN) 8 MG tablet Take 1 tablet (8 mg total) by mouth every 8 (eight) hours as needed for nausea or vomiting. 60 tablet 3   oxyCODONE (ROXICODONE) 5 MG immediate release tablet Take 1 tablet (5 mg total) by mouth every 8 (eight) hours as needed. 90 tablet 0   Probiotic Product (PROBIOTIC BLEND PO) Take 1 capsule by mouth daily.     prochlorperazine (COMPAZINE) 10 MG tablet Take 1 tablet (10 mg total) by mouth every 6 (six) hours as needed for nausea or vomiting. 60 tablet 3   Resveratrol-Quercetin 100-100 MG TABS Take 100 mg by mouth daily.     Vitamin E 268 MG (400 UNIT) CAPS Take 400 Units by mouth daily.     Zinc 50 MG TABS Take 50 mg by mouth daily.     No current facility-administered  medications for this visit.    REVIEW OF SYSTEMS:   Constitutional: ( - ) fevers, ( - )  chills , ( - ) night sweats Eyes: ( - ) blurriness of vision, ( - ) double vision, ( - ) watery eyes Ears, nose, mouth, throat, and face: ( - ) mucositis, ( - ) sore throat Respiratory: ( - ) cough, ( - ) dyspnea, ( - ) wheezes Cardiovascular: ( - ) palpitation, ( - ) chest discomfort, ( - ) lower extremity swelling Gastrointestinal:  ( - ) nausea, ( - ) heartburn, ( - ) change in bowel habits Skin: ( - ) abnormal skin rashes Lymphatics: ( - ) new lymphadenopathy, ( - ) easy bruising Neurological: ( - ) numbness, ( - ) tingling, ( - ) new weaknesses Behavioral/Psych: ( - ) mood change, ( - ) new changes  All other systems were reviewed with the patient and are negative.  PHYSICAL EXAMINATION: ECOG PERFORMANCE  STATUS: 1 - Symptomatic but completely ambulatory  There were no vitals filed for this visit.  There were no vitals filed for this visit.   GENERAL: Norma Morales in NAD  SKIN:  rashes or significant lesions. Jaundice.  EYES: conjunctiva are pink and non-injected, sclera icteric LUNGS: clear to auscultation and percussion with normal breathing effort HEART: regular rate & rhythm and no murmurs and no lower extremity edema Abdomen: non-distended, normal bowel sounds Musculoskeletal: no cyanosis of digits and no clubbing  PSYCH: alert & oriented x 3, fluent speech NEURO: no focal motor/sensory deficits  LABORATORY DATA:  I have reviewed the data as listed CBC Latest Ref Rng & Units 01/26/2021 01/20/2021 01/19/2021  WBC 4.0 - 10.5 K/uL 14.7(H) 9.5 8.3  Hemoglobin 12.0 - 15.0 g/dL 9.8(L) 9.0(L) 9.2(L)  Hematocrit 36.0 - 46.0 % 29.4(L) 28.4(L) 28.8(L)  Platelets 150 - 400 K/uL 505(H) 322 327    CMP Latest Ref Rng & Units 01/26/2021 01/20/2021 01/19/2021  Glucose 70 - 99 mg/dL 133(H) 91 95  BUN 8 - 23 mg/dL 9 <5(L) 7(L)  Creatinine 0.44 - 1.00 mg/dL 0.65 0.50  0.36(L)  Sodium 135 - 145 mmol/L 135 132(L) 132(L)  Potassium 3.5 - 5.1 mmol/L 3.7 3.7 3.6  Chloride 98 - 111 mmol/L 98 104 100  CO2 22 - 32 mmol/L 25 21(L) 24  Calcium 8.9 - 10.3 mg/dL 9.0 8.0(L) 8.1(L)  Total Protein 6.5 - 8.1 g/dL 6.8 5.6(L) 6.0(L)  Total Bilirubin 0.3 - 1.2 mg/dL 9.0(HH) 1.4(H) 2.0(H)  Alkaline Phos 38 - 126 U/L 666(H) 357(H) 368(H)  AST 15 - 41 U/L 252(HH) 147(H) 220(H)  ALT 0 - 44 U/L 282(HH) 153(H) 188(H)    PATHOLOGY: SURGICAL PATHOLOGY  CASE: [id number]  A. LIVER, RIGHT, MASS, BIOPSY:  - Metastatic adenocarcinoma consistent with colorectal origin.   COMMENT:  Sections demonstrate metastatic moderately differentiated adenocarcinoma  with dirty necrosis and extracellular mucin involving hepatic  parenchyma. A limited panel of immunohistochemical stains was performed  and the adenocarcinoma is positive for cytokeratin 20 and CDX-2 while  negative for cytokeratin 7. The morphologic findings in conjunction with  the pattern of immunohistochemical staining is consistent with  metastatic adenocarcinoma of colorectal origin.   RADIOGRAPHIC STUDIES: I have personally reviewed the radiological images as listed and agreed with the findings in the report. CT Chest W Contrast  Result Date: 01/11/2021 CLINICAL DATA:  Staging for metastatic disease in the abdomen/pelvis EXAM: CT CHEST WITH CONTRAST TECHNIQUE: Multidetector CT imaging of the chest was performed during intravenous contrast administration. CONTRAST:  75m OMNIPAQUE IOHEXOL 350 MG/ML SOLN COMPARISON:  None. FINDINGS: Cardiovascular: No significant vascular findings. Normal heart size. No pericardial effusion. Mediastinum/Nodes: No enlarged mediastinal, hilar, or axillary lymph nodes. Thyroid gland, trachea, and esophagus demonstrate no significant findings. Lungs/Pleura: Trachea and central bronchi are patent. No endobronchial lesion. Biapical pleural/parenchymal scarring. 2 mm nodular density in the  anterior aspect of the right upper lobe (status 7 image 47). 3 mm nodular density in the posterior right lower lobe (series 7 image 80). There is a 6 mm density in the medial aspect of the left lower lobe abutting the pleura (series 7 image 122). Another density on the same image measuring 1.0 x 0.3 cm. Another density in the posterolateral aspect of the left lower lobe (image 115) measuring approximately 0.8 x 0.9 cm. And other nodular density in the left lower lobe on image 92 measuring approximately 4 mm. Upper Abdomen: There is a ill-defined hypodense lesion in  the right hepatic lobe on series 2 image 136, measures at least 5.5 x 4.0 cm. Few other small hypodense lesions in the liver. Musculoskeletal: No chest wall abnormality. No acute or significant osseous findings. IMPRESSION: 1. Multiple small pulmonary densities in the bilateral lower lobes, of indeterminate etiology, this may represent atelectasis or metastatic disease. Short-term follow-up examination in 3-6 months is recommended. 2. Ill-defined hypodense lesion in the liver measuring at least 5.5 x 4.0 cm as well as few small hypodense lesions concerning for malignant process. 3.  No mediastinal lymphadenopathy. 4.  No suspicious osseous lesion. Electronically Signed   By: Keane Police D.O.   On: 01/11/2021 15:01   CT Abdomen Pelvis W Contrast  Result Date: 01/18/2021 CLINICAL DATA:  Concern for abdominal abscess or infection. History of metastatic colon cancer. Status post core biopsy of right hepatic mass. EXAM: CT ABDOMEN AND PELVIS WITH CONTRAST TECHNIQUE: Multidetector CT imaging of the abdomen and pelvis was performed using the standard protocol following bolus administration of intravenous contrast. CONTRAST:  44m OMNIPAQUE IOHEXOL 350 MG/ML SOLN COMPARISON:  None. FINDINGS: Lower chest: Bibasilar subpleural atelectasis/scarring. A 7 mm nodular density in the left lower lobe most consistent with metastatic disease. No intra-abdominal free  air.  Small free fluid in the pelvis. Hepatobiliary: Multiple hepatic hypodense lesions consistent with metastatic disease. There is a 5.3 x 5.1 cm hypoenhancing area in the right lobe of the liver with small pockets of air corresponding to the biopsied lesion. This likely represent an infiltrative mass or metastasis. An infectious process or developing abscess is less likely but not excluded. No drainable fluid collection identified. No calcified gallstone. There is dilatation of the intrahepatic and extrahepatic biliary tree. The common bile duct measures 17 mm in diameter. There is somewhat narrowing of the central CBD which is suboptimally evaluated on this CT but may be related to mass effect and compression by adjacent adenopathy. Further evaluation with MRI/MRCP is recommended. Pancreas: There is a 12 x 10 mm low attenuating nodule in the body of the pancreas (31/2). Several additional low attenuating lesions in the region of the head of the pancreas noted which may represent peripancreatic adenopathy or metastatic disease. No active inflammatory changes of the pancreas. No gland atrophy or dilatation of the main pancreatic duct. Spleen: Normal in size without focal abnormality. Adrenals/Urinary Tract: The adrenal glands are unremarkable. The kidneys, visualized ureters, and urinary bladder appear unremarkable. Stomach/Bowel: There is sigmoid diverticulosis without active inflammatory changes. There is moderate stool throughout the colon. There is thickened and irregular appearance of the wall of the ascending colon in the region of the hepatic flexure likely representing known colonic mass/malignancy. There is no bowel obstruction. The appendix is normal. Vascular/Lymphatic: Mild aortoiliac atherosclerotic disease. The IVC is unremarkable. No portal venous gas. Retroperitoneal adenopathy. Reproductive: The uterus is grossly unremarkable. Other: Scattered omental implants consistent with metastatic disease.  The largest implant in the left lower quadrant measures approximately 17 x 20 mm (65/2). Musculoskeletal: Degenerative changes of the spine. No acute osseous pathology. IMPRESSION: 1. Thickened and irregular appearance of the wall of the ascending colon in the region of the hepatic flexure likely representing known colonic mass/malignancy. 2. Multiple hepatic hypodense lesions consistent with metastatic disease. The largest lesion in the right lobe of the liver has been biopsied. No drainable fluid collection/abscess. 3. Dilated intrahepatic and extrahepatic biliary tree with findings concerning for narrowing of the central CBD. Further evaluation with MRI/MRCP is recommended. 4. Scattered omental implants consistent with metastatic  disease. 5. Left lower lobe pulmonary nodule/metastatic disease. 6. Sigmoid diverticulosis. No bowel obstruction. Normal appendix. 7. Aortic Atherosclerosis (ICD10-I70.0). Electronically Signed   By: Anner Crete M.D.   On: 01/18/2021 21:04   MR SACRUM SI JOINTS W WO CONTRAST  Result Date: 01/10/2021 CLINICAL DATA:  Suspected bone lesion seen on prior CT examination EXAM: MRI SACRUM WITH AND WITHOUT CONTRAST TECHNIQUE: Multiplanar multi-sequence MR imaging of the sacrum was performed without and with intravenous contrast. COMPARISON:  None. CT examination dated December 29, 2020 FINDINGS: Bones/Joint/Cartilage No fracture or dislocation. Normal alignment. No joint effusion. No marrow signal abnormality. No SI joint widening or erosive changes. No subchondral reactive marrow changes. No SI joint effusion. There is a T1 hypointense, T2 hyperintense nonenhancing structure abutting the left dorsal root of S2 measuring approximately 1.3 x 1.6 x 1.7 cm, most consistent with a Tarlov cyst, a benign process. No focal lumbar spine abnormality. Degenerate disc disease of the L5-S1. Ligaments, Muscles and Tendons Muscles are normal. No muscle atrophy. No muscle edema. Piriformis muscles are  normal bilaterally without signal abnormality. Soft tissue No fluid collection or hematoma. No soft tissue mass. Normal neurovascular bundles. Visualized pelvic viscera are unremarkable. IMPRESSION: 1.  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 2.  No suspicious osseous lesion. Electronically Signed   By: Keane Police D.O.   On: 01/10/2021 17:50   MR 3D Recon At Scanner  Result Date: 01/19/2021 CLINICAL DATA:  Right upper quadrant abdominal pain, nondiagnostic ultrasound. EXAM: MRI ABDOMEN WITHOUT AND WITH CONTRAST (INCLUDING MRCP) TECHNIQUE: Multiplanar multisequence MR imaging of the abdomen was performed both before and after the administration of intravenous contrast. Heavily T2-weighted images of the biliary and pancreatic ducts were obtained, and three-dimensional MRCP images were rendered by post processing. CONTRAST:  97m GADAVIST GADOBUTROL 1 MMOL/ML IV SOLN COMPARISON:  01/18/2021. FINDINGS: Lower chest: Atelectasis or infiltrate is noted at the lung bases. There is redemonstration of a nodular lesion in the left lower lobe measuring 8 mm. Trace bilateral pleural effusions are present. Hepatobiliary: There is a heterogeneous ill-defined masslike lesion with rim enhancement in the posterior right lobe of the liver measuring 5.6 x 5.6 cm. Additional smaller T2 hyperintensities are present in the right lobe of the liver with rim enhancement. Intrahepatic and extrahepatic biliary ductal dilatation is noted. The proximal common bile duct is distended measuring 1.7 cm in diameter. There is segmental narrowing with smooth shouldering of the mid common bile duct, which may be related to extrinsic compression from lymphadenopathy. No enhancing mass is identified in this region. The distal common bile duct is normal in caliber. No choledocholithiasis. The gallbladder is contracted and contains T1 hyperintense material, possible sludge. Pancreas: The pancreas enhances normally. The previously  described low-attenuation nodule in the body of the pancreas is not well seen on this exam. No significant pancreatic ductal dilatation. Spleen:  Within normal limits in size and appearance. Adrenals/Urinary Tract: The adrenal glands are within normal limits. No evidence of hydronephrosis. A few subcentimeter T2 hyperintensities are seen in the kidneys which are too small to further characterize and may represent cysts. Stomach/Bowel: No bowel obstruction. There is focal colonic wall narrowing with wall thickening and enhancement involving the hepatic flexure, which may represent patient's known mass. A moderate amount of retained stool is present in the colon. Vascular/Lymphatic: Multiple prominent lymph nodes are seen in the gastrohepatic ligament, porta hepatis, and retroperitoneum. There are nodules with restricted diffusion in the anterior peritoneum, concerning for  metastatic disease. The aorta is normal in caliber. The splenic vein, portal vein, and superior mesenteric vein are patent. Other:  No free fluid. Musculoskeletal: No suspicious bone lesions identified. IMPRESSION: 1. Multiple rim enhancing lesions in the liver, the largest measuring 5.6 x 5.6 cm, suggesting metastatic disease. Given history of recent biopsy, correlation with biopsy results is recommended. 2. Intrahepatic and extrahepatic biliary ductal dilatation. There is smooth shouldering of the mid common bile duct with segmental narrowing which may be due to extrinsic mass effect from local lymphadenopathy. No definite infiltrative mass is seen. 3. Enhancing mass at the colonic hepatic flexure, may represent patient's known colon cancer. 4. Multiple enlarged lymph nodes in the porta hepatis, gastrohepatic ligament, and retroperitoneum. There also nodules with restricted diffusion in the anterior peritoneum, concerning for peritoneal metastasis. 5. Left lower lobe pulmonary nodule, possible metastatic disease. Electronically Signed   By: Brett Fairy M.D.   On: 01/19/2021 04:22   IR US Guide Bx Asp/Drain  Result Date: 01/17/2021 INDICATION: HEPATIC METASTATIC DISEASE. EXAM: ULTRASOUND CORE BIOPSY RIGHT HEPATIC MASS MEDICATIONS: 1% LIDOCAINE ANESTHESIA/SEDATION: Moderate (conscious) sedation was employed during this procedure. A total of Versed 2.0 mg and Fentanyl 100 mcg was administered intravenously by the radiology nurse. Total intra-service moderate Sedation Time: 10 minutes. The patient's level of consciousness and vital signs were monitored continuously by radiology nursing throughout the procedure under my direct supervision. FLUOROSCOPY TIME:  Fluoroscopy Time: None. COMPLICATIONS: None immediate. PROCEDURE: Informed written consent was obtained from the patient after a thorough discussion of the procedural risks, benefits and alternatives. All questions were addressed. Maximal Sterile Barrier Technique was utilized including caps, mask, sterile gowns, sterile gloves, sterile drape, hand hygiene and skin antiseptic. A timeout was performed prior to the initiation of the procedure. Previous imaging reviewed. Preliminary ultrasound performed. Right hepatic lesion was localized and marked for a mid axillary line approach through a lower intercostal space. Under sterile conditions and local anesthesia, a 17 gauge 11.8 cm guide was advanced to the lesion. Needle position confirmed with ultrasound. Images obtained for documentation. 18 gauge core biopsies obtained under direct ultrasound. Samples were intact and non fragmented. These were placed in formalin. Needle tract occluded with Gel-Foam. Postprocedure imaging demonstrates no hemorrhage or hematoma. Patient tolerated biopsy well. IMPRESSION: Successful ultrasound right hepatic mass 18 gauge core biopsy Electronically Signed   By: Jerilynn Mages.  Shick M.D.   On: 01/17/2021 14:56   MR ABDOMEN MRCP W WO CONTAST  Result Date: 01/19/2021 CLINICAL DATA:  Right upper quadrant abdominal pain, nondiagnostic  ultrasound. EXAM: MRI ABDOMEN WITHOUT AND WITH CONTRAST (INCLUDING MRCP) TECHNIQUE: Multiplanar multisequence MR imaging of the abdomen was performed both before and after the administration of intravenous contrast. Heavily T2-weighted images of the biliary and pancreatic ducts were obtained, and three-dimensional MRCP images were rendered by post processing. CONTRAST:  57m GADAVIST GADOBUTROL 1 MMOL/ML IV SOLN COMPARISON:  01/18/2021. FINDINGS: Lower chest: Atelectasis or infiltrate is noted at the lung bases. There is redemonstration of a nodular lesion in the left lower lobe measuring 8 mm. Trace bilateral pleural effusions are present. Hepatobiliary: There is a heterogeneous ill-defined masslike lesion with rim enhancement in the posterior right lobe of the liver measuring 5.6 x 5.6 cm. Additional smaller T2 hyperintensities are present in the right lobe of the liver with rim enhancement. Intrahepatic and extrahepatic biliary ductal dilatation is noted. The proximal common bile duct is distended measuring 1.7 cm in diameter. There is segmental narrowing with smooth shouldering of the mid  common bile duct, which may be related to extrinsic compression from lymphadenopathy. No enhancing mass is identified in this region. The distal common bile duct is normal in caliber. No choledocholithiasis. The gallbladder is contracted and contains T1 hyperintense material, possible sludge. Pancreas: The pancreas enhances normally. The previously described low-attenuation nodule in the body of the pancreas is not well seen on this exam. No significant pancreatic ductal dilatation. Spleen:  Within normal limits in size and appearance. Adrenals/Urinary Tract: The adrenal glands are within normal limits. No evidence of hydronephrosis. A few subcentimeter T2 hyperintensities are seen in the kidneys which are too small to further characterize and may represent cysts. Stomach/Bowel: No bowel obstruction. There is focal colonic wall  narrowing with wall thickening and enhancement involving the hepatic flexure, which may represent patient's known mass. A moderate amount of retained stool is present in the colon. Vascular/Lymphatic: Multiple prominent lymph nodes are seen in the gastrohepatic ligament, porta hepatis, and retroperitoneum. There are nodules with restricted diffusion in the anterior peritoneum, concerning for metastatic disease. The aorta is normal in caliber. The splenic vein, portal vein, and superior mesenteric vein are patent. Other:  No free fluid. Musculoskeletal: No suspicious bone lesions identified. IMPRESSION: 1. Multiple rim enhancing lesions in the liver, the largest measuring 5.6 x 5.6 cm, suggesting metastatic disease. Given history of recent biopsy, correlation with biopsy results is recommended. 2. Intrahepatic and extrahepatic biliary ductal dilatation. There is smooth shouldering of the mid common bile duct with segmental narrowing which may be due to extrinsic mass effect from local lymphadenopathy. No definite infiltrative mass is seen. 3. Enhancing mass at the colonic hepatic flexure, may represent patient's known colon cancer. 4. Multiple enlarged lymph nodes in the porta hepatis, gastrohepatic ligament, and retroperitoneum. There also nodules with restricted diffusion in the anterior peritoneum, concerning for peritoneal metastasis. 5. Left lower lobe pulmonary nodule, possible metastatic disease. Electronically Signed   By: Brett Fairy M.D.   On: 01/19/2021 04:22   IR IMAGING GUIDED PORT INSERTION  Result Date: 01/17/2021 CLINICAL DATA:  Imaging findings consistent with metastatic colon cancer to liver EXAM: RIGHT INTERNAL JUGULAR SINGLE LUMEN POWER PORT CATHETER INSERTION Date:  01/17/2021 01/17/2021 2:30 pm Radiologist:  M. Daryll Brod, MD Guidance:  Ultrasound and fluoroscopic MEDICATIONS: 1% lidocaine local with epinephrine ANESTHESIA/SEDATION: Versed 2.0 mg IV; Fentanyl 100 mcg IV; Moderate  Sedation Time:  23 minute The patient was continuously monitored during the procedure by the interventional radiology nurse under my direct supervision. FLUOROSCOPY TIME:  0 minutes, 36 seconds (1 mGy) COMPLICATIONS: None immediate. CONTRAST:  None. PROCEDURE: Informed consent was obtained from the patient following explanation of the procedure, risks, benefits and alternatives. The patient understands, agrees and consents for the procedure. All questions were addressed. A time out was performed. Maximal barrier sterile technique utilized including caps, mask, sterile gowns, sterile gloves, large sterile drape, hand hygiene, and 2% chlorhexidine scrub. Under sterile conditions and local anesthesia, right internal jugular micropuncture venous access was performed. Access was performed with ultrasound. Images were obtained for documentation of the patent right internal jugular vein. A guide wire was inserted followed by a transitional dilator. This allowed insertion of a guide wire and catheter into the IVC. Measurements were obtained from the SVC / RA junction back to the right IJ venotomy site. In the right infraclavicular chest, a subcutaneous pocket was created over the second anterior rib. This was done under sterile conditions and local anesthesia. 1% lidocaine with epinephrine was utilized for this. A  2.5 cm incision was made in the skin. Blunt dissection was performed to create a subcutaneous pocket over the right pectoralis major muscle. The pocket was flushed with saline vigorously. There was adequate hemostasis. The port catheter was assembled and checked for leakage. The port catheter was secured in the pocket with two retention sutures. The tubing was tunneled subcutaneously to the right venotomy site and inserted into the SVC/RA junction through a valved peel-away sheath. Position was confirmed with fluoroscopy. Images were obtained for documentation. The patient tolerated the procedure well. No  immediate complications. Incisions were closed in a two layer fashion with 4 - 0 Vicryl suture. Dermabond was applied to the skin. The port catheter was accessed, blood was aspirated followed by saline and heparin flushes. Needle was removed. A dry sterile dressing was applied. IMPRESSION: Ultrasound and fluoroscopically guided right internal jugular single lumen power port catheter insertion. Tip in the SVC/RA junction. Catheter ready for use. Electronically Signed   By: Jerilynn Mages.  Shick M.D.   On: 01/17/2021 14:53    ASSESSMENT & PLAN Andalyn Heckstall is a 75 y.o. 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  She presents to start chemotherapy.   The mainstay treatment will be chemotherapy with the rationale to prolong life and help improve cancer related symptoms.  This is unfortunately not a curable disease.  The proposed treatment regimen includes infusional 5-FU, oxaliplatin and bevacizumab given once every 2 weeks.  The dose, frequency and common side effects were discussed with the patient.  Common side effects include fatigue, appetite loss, nausea, diarrhea, cold sensitivity, neuropathy, poor wound healing and increased risk of bleeding.  We will plan to obtain repeat imaging in 2 to 3 months to assess treatment response.  Additionally, we have ordered additional biomarkers including KRAS, NRAS, BRAS and MSI/MMR. Patient is scheduled for chemoeducation on 01/25/2021 and tenative start date for chemotherapy on 01/26/2021.    # Elevated Bilirubin/LFTs -- Patient presents today for day 1 of chemotherapy but unfortunately found to have bilirubin of 9.0, AST 252, ALT of 282 --Concern for possible biliary obstruction.  The patient was just admitted last week on concern for obstruction --Requests consult to GI upon admission with recommended imaging studies and consideration of ERCP. --Oncology will continue to follow the patient while in  house.  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  --Recommended treatment regimen includes FOLFOX plus Bevacizumab, tentative start date on 01/26/2021. --RTC on 02/09/2021 scheduled  #RUQ abdominal pain: --Likely secondary to liver metastases --Currently on oxycodone 5 mg q 8 hours with improvement of pain. Refill sent.  --Advised patient to be aggressive with her bowel regimen to minimize constipation.   #Supportive care: --Port has been placed. EMLA cream ordered.  --Referral to nutrition has been placed --Zofran and Compazine prescriptions sent  No orders of the defined types were placed in this encounter.  All questions were answered. The patient knows to call the clinic with any problems, questions or concerns.  I have spent a total of 30 minutes minutes of face-to-face and non-face-to-face time, preparing to see the patient, obtaining and/or reviewing separately obtained history, performing a medically appropriate examination, counseling and educating the patient, ordering medications, referring and communicating with other health care professionals, documenting clinical information in the electronic health record, and care coordination.   Ziere Docken T.  Lorenso Courier, MD Department of Hematology/Oncology [place] [facility] Phone: [phone] Pager: [phone] Email: [email or web address]

## 2021-01-26 NOTE — Assessment & Plan Note (Addendum)
WBC count elevated.  Patient does not have any evidence of fever or chills.  Unsure of the etiology. Will empirically cover with antibiotics given that her bilirubin has risen further.  Will discuss with GI regarding need for antibiotic.

## 2021-01-26 NOTE — Assessment & Plan Note (Addendum)
Etiology unclear. Patient has cough ongoing for 3 months progressively worsening. Will treat symptomatically. Already on antibiotics for intra-abdominal infection. Chest x-ray negative for any pneumonia.

## 2021-01-26 NOTE — Progress Notes (Signed)
Pt arrived to infusion, lab values returned abnormal, per Dr.Dorsey pt not to receive chemo today. Pt sent to visit with MD, port still accessed and biopatch in place. VSS at time of discharge to MD side.

## 2021-01-26 NOTE — Assessment & Plan Note (Signed)
Follows up with oncology. Management per Dr. Lorenso Courier.

## 2021-01-26 NOTE — Telephone Encounter (Signed)
CRITICAL VALUE STICKER  CRITICAL VALUE: Bili = 9.0, AST = 252 and ALT = 282  RECEIVER (on-site recipient of call): Yetta Glassman, CMA  DATE & TIME NOTIFIED: 01/26/21 at 1:04pm  MESSENGER (representative from lab): Lelan Pons  MD NOTIFIED: Lorenso Courier  TIME OF NOTIFICATION: 01/26/21 at 1:08pm  RESPONSE: Notification given to Dr. Lorenso Courier for follow-up with the pt.

## 2021-01-26 NOTE — H&P (Signed)
History and Physical    Norma Morales RCV:[id number] DOB: 1946-02-08 DOA: 01/26/2021  PCP: Jene Every, MD   Chief Complaint: Abnormal lab  HPI: Norma Morales is a 75 y.o. female with medical history significant of past medical history of adenocarcinoma with recent admission for consult for abdominal pain as well as elevated LFT.  Now presents to the hospital with complaints of abnormal lab. Patient was seen at oncologist office for a routine follow-up and IV fluid infusion. At which time her blood work showed that she had significant elevation of her bilirubin level and therefore she was referred for admission. Patient denies any itching.  Patient denies noticing that she actually has jaundice.  Patient denies any headache or nausea or vomiting.  Her abdominal pain is persistent but stable on pain medication.  She tells me that the pain medication may not be lasting long enough. She also denies any diarrhea or constipation and takes a stool softener. She denies any swelling of the leg. No recent change in the medication.  Does not take any new herbal supplements.  No alcohol abuse.   Review of Systems:  As per HPI otherwise 10 point review of systems negative.   Allergies  Allergen Reactions   Aspirin Hives    Past Medical History:  Diagnosis Date   Cancer ([place])    colon   Hyperlipidemia     Past Surgical History:  Procedure Laterality Date   IR IMAGING GUIDED PORT INSERTION  01/17/2021   IR US GUIDE BX ASP/DRAIN  01/17/2021   TONSILLECTOMY       reports that she has never smoked. She has never used smokeless tobacco. She reports that she does not drink alcohol and does not use drugs.  History reviewed. No pertinent family history.  Prior to Admission medications   Medication Sig Start Date End Date Taking? Authorizing Provider  alendronate (FOSAMAX) 70 MG tablet Take 70 mg by mouth every Sunday. 12/28/20  Yes [provider]  atenolol (TENORMIN) 25 MG tablet  Take 25 mg by mouth at bedtime. 06/14/10  Yes [provider]  ezetimibe (ZETIA) 10 MG tablet Take 10 mg by mouth at bedtime. 07/07/20  Yes [provider]  lidocaine-prilocaine (EMLA) cream Apply 1 application topically as needed. Patient taking differently: Apply 1 application topically as needed (for accessing port). 01/23/21  Yes Dede Query T, PA-C  NON FORMULARY Take 30 mLs by mouth See admin instructions. [employer] Gold Multivitamin Liquid-/Healthy Immune System & Well-Being (includes Vitamins D3, B12, K2 & Over 120 Whole Food Nutrients)- Drink 30 ml's by mouth once a day   Yes [provider]  ondansetron (ZOFRAN) 8 MG tablet Take 1 tablet (8 mg total) by mouth every 8 (eight) hours as needed for nausea or vomiting. 01/23/21  Yes Dede Query T, PA-C  oxyCODONE (ROXICODONE) 5 MG immediate release tablet Take 1 tablet (5 mg total) by mouth every 8 (eight) hours as needed. Patient taking differently: Take 5 mg by mouth every 8 (eight) hours as needed (for pain). 01/23/21 01/23/22 Yes Dede Query T, PA-C  prochlorperazine (COMPAZINE) 10 MG tablet Take 1 tablet (10 mg total) by mouth every 6 (six) hours as needed for nausea or vomiting. 01/23/21  Yes Dede Query T, PA-C  ascorbic acid (VITAMIN C) 500 MG tablet Take 500 mg by mouth daily. Patient not taking: Reported on 01/26/2021    [provider]  Calcium Carbonate (CALCIUM 600 PO) Take 600 mg by mouth daily. Patient not  taking: Reported on 01/26/2021    [provider]  Cholecalciferol 25 MCG (1000 UT) capsule Take 1,000 Units by mouth daily. Patient not taking: Reported on 01/26/2021    [provider]  Coenzyme Q10 (COQ10) 50 MG CAPS Take 50 mg by mouth daily. Patient not taking: Reported on 01/26/2021    [provider]  Glucosamine-Chondroitin (GLUCOSAMINE CHONDR COMPLEX PO) Take 1 tablet by mouth daily. Patient not taking: Reported on 01/26/2021    [provider]  Omega-3 1000 MG CAPS Take 1,000 mg by mouth daily. Patient not taking: Reported on 01/26/2021    [provider]  Probiotic Product (PROBIOTIC BLEND PO) Take 1 capsule by mouth daily. Patient not taking: Reported on 01/26/2021    [provider]  Resveratrol-Quercetin 100-100 MG TABS Take 100 mg by mouth daily. Patient not taking: Reported on 01/26/2021    [provider]  Vitamin E 268 MG (400 UNIT) CAPS Take 400 Units by mouth daily. Patient not taking: Reported on 01/26/2021    [provider]  Zinc 50 MG TABS Take 50 mg by mouth daily. Patient not taking: Reported on 01/26/2021    [provider]    Physical Exam: Vitals:   01/26/21 1642  BP: 131/71  Pulse: 96  Resp: 18  Temp: 98.1 F (36.7 C)  TempSrc: Oral  SpO2: 100%  Weight: 62.7 kg  Height: 5\' 3"  (1.6 m)   General: Appear in mild distress, no Rash; Oral Mucosa Clear, moist. no Abnormal Neck Mass Or lumps, Conjunctiva normal, icterus seen Cardiovascular: S1 and S2 Present, no Murmur, Respiratory: good respiratory effort, Bilateral Air entry present and CTA, no Crackles, no wheezes Abdomen: Bowel Sound present, Soft and mild diffuse tenderness Extremities: Trace pedal edema Neurology: alert and oriented to time, place, and person affect appropriate. no new focal deficit Gait not checked due to patient safety concerns       Labs on Admission: I have personally reviewed the patients's labs and imaging studies.  Assessment/Plan * Hyperbilirubinemia [facility] consulted.  Bilirubin level elevated significantly from normal 1 week ago to 9 with jaundice.  Most likely from extrinsic compression of the CBD seen on the MRI. We will follow-up on GI recommendation regarding possibility of ERCP with stenting. Will keep patient n.p.o. after midnight just in case.  Transaminitis LFT has been chronically elevated but now mildly worsened. No evidence of cholangitis on exam. Give IV  hydration.  Avoid hepatotoxic medication.  Consult GI.  Cough Etiology unclear. Patient has cough ongoing for 3 months progressively worsening. Will treat symptomatically. Already on antibiotics for intra-abdominal infection. Will get chest x-ray as well.  Leucocytosis WBC count elevated.  Patient does not have any evidence of fever or chills.  Unsure of the etiology. Will empirically cover with antibiotics given that her bilirubin has risen further.  Anemia associated with chemotherapy H&H relatively stable but on the lower side.  Monitor.  Adenocarcinoma of colon ([place]) Follows up with oncology. Management per Dr. Lorenso Courier.      Code Status: Full Code   DVT Prophylaxis:   heparin injection 5,000 Units Start: 01/26/21 2200   Family Communication: Husband at bedside. Admission status: Inpatient Med-Surg  Certification: The appropriate patient status for this patient is INPATIENT. Inpatient status is judged to be reasonable and necessary in order to provide the required intensity of service to ensure the patient's safety. The patient's presenting symptoms, physical exam findings, and initial radiographic and laboratory data in the context of  their chronic comorbidities is felt to place them at high risk for further clinical deterioration. Furthermore, it is not anticipated that the patient will be medically stable for discharge from the hospital within 2 midnights of admission.   * I certify that at the point of admission it is my clinical judgment that the patient will require inpatient hospital care spanning beyond 2 midnights from the point of admission due to high intensity of service, high risk for further deterioration and high frequency of surveillance required.Berle Mull MD [facility] If 7PM-7AM, please contact night-coverage [email or web address]  01/26/2021, 8:06 PM

## 2021-01-26 NOTE — Assessment & Plan Note (Addendum)
[facility] consulted.  Bilirubin level elevated significantly from normal 1 week ago to 9 with jaundice.  Most likely from extrinsic compression of the CBD seen on the MRI. Patient underwent ERCP.  Uncovered stent was placed.  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 Clear liquid diet advance to soft diet if tolerates. No anticoagulation for 24 hours. Recheck LFT in the morning.

## 2021-01-26 NOTE — Progress Notes (Signed)
Met w/ pt to introduce myself as her Arboriculturist.  Pt has 2 insurances so copay assistance shouldn't be needed.  I offered the [employer], went over what it covers and gave her the income requirement.  Pt stated she exceeds the income so she doesn't qualify for the grant at this time.  She has my card for any questions or concerns she may have in the future.

## 2021-01-26 NOTE — Assessment & Plan Note (Signed)
H&H relatively stable but on the lower side.  Monitor.

## 2021-01-26 NOTE — Progress Notes (Signed)
Pt being admitted to [facility] room # 1601 for elevated bilirubin of 9.0 and elevated LFTs.  Pt has her accessed already. No chemo given today. Pt is alert and oriented. VSS. Report called to Claiborne Billings, RN Transported in w/c with husband  in attendance.

## 2021-01-26 NOTE — Progress Notes (Signed)
Pharmacy Antibiotic Note  Norma Morales is a 75 y.o. female admitted on 01/26/2021 with intra-abdominal.  Pharmacy has been consulted for Zosyn dosing.  Plan: Zosyn 3.375g IV q8h (4 hour infusion). No dose adjustments needed, Pharmacy will sign off  Height: 5\' 3"  (160 cm) Weight: 62.7 kg (138 lb 3.7 oz) IBW/kg (Calculated) : 52.4  Temp (24hrs), Avg:98.1 F (36.7 C), Min:98 F (36.7 C), Max:98.1 F (36.7 C)  Recent Labs  Lab 01/20/21 0858 01/26/21 1204  WBC 9.5 14.7*  CREATININE 0.50 0.65    Estimated Creatinine Clearance: 51 mL/min (by C-G formula based on SCr of 0.65 mg/dL).    Allergies  Allergen Reactions   Aspirin Hives    Thank you for allowing pharmacy to be a part of this patient's care.  Peggyann Juba, PharmD, BCPS Pharmacy: [phone] 01/26/2021 6:27 PM

## 2021-01-27 ENCOUNTER — Inpatient Hospital Stay (HOSPITAL_COMMUNITY): Payer: Medicare Other | Admitting: Anesthesiology

## 2021-01-27 ENCOUNTER — Encounter (HOSPITAL_COMMUNITY): Payer: Self-pay | Admitting: Internal Medicine

## 2021-01-27 ENCOUNTER — Encounter: Payer: Self-pay | Admitting: Hematology and Oncology

## 2021-01-27 ENCOUNTER — Other Ambulatory Visit: Payer: Self-pay

## 2021-01-27 ENCOUNTER — Encounter (HOSPITAL_COMMUNITY)
Admission: AD | Disposition: A | Discharge status: Home / Self Care | Payer: Self-pay | Source: Home / Self Care | Attending: Internal Medicine

## 2021-01-27 ENCOUNTER — Inpatient Hospital Stay (HOSPITAL_COMMUNITY): Payer: Medicare Other

## 2021-01-27 DIAGNOSIS — I471 Supraventricular tachycardia: Secondary | ICD-10-CM | POA: Clinically undetermined

## 2021-01-27 DIAGNOSIS — K831 Obstruction of bile duct: Secondary | ICD-10-CM | POA: Diagnosis not present

## 2021-01-27 HISTORY — PX: SPHINCTEROTOMY: SHX5279

## 2021-01-27 HISTORY — PX: ERCP: SHX5425

## 2021-01-27 HISTORY — PX: BILIARY STENT PLACEMENT: SHX5538

## 2021-01-27 LAB — CBC
HCT: 26.7 % — ABNORMAL LOW (ref 36.0–46.0)
Hemoglobin: 8.8 g/dL — ABNORMAL LOW (ref 12.0–15.0)
MCH: 26.4 pg (ref 26.0–34.0)
MCHC: 33 g/dL (ref 30.0–36.0)
MCV: 80.2 fL (ref 80.0–100.0)
Platelets: 453 10*3/uL — ABNORMAL HIGH (ref 150–400)
RBC: 3.33 MIL/uL — ABNORMAL LOW (ref 3.87–5.11)
RDW: 15.2 % (ref 11.5–15.5)
WBC: 11.1 10*3/uL — ABNORMAL HIGH (ref 4.0–10.5)
nRBC: 0 % (ref 0.0–0.2)

## 2021-01-27 LAB — COMPREHENSIVE METABOLIC PANEL
ALT: 195 U/L — ABNORMAL HIGH (ref 0–44)
AST: 187 U/L — ABNORMAL HIGH (ref 15–41)
Albumin: 2.4 g/dL — ABNORMAL LOW (ref 3.5–5.0)
Alkaline Phosphatase: 480 U/L — ABNORMAL HIGH (ref 38–126)
Anion gap: 6 (ref 5–15)
BUN: 8 mg/dL (ref 8–23)
CO2: 26 mmol/L (ref 22–32)
Calcium: 7.9 mg/dL — ABNORMAL LOW (ref 8.9–10.3)
Chloride: 99 mmol/L (ref 98–111)
Creatinine, Ser: 0.39 mg/dL — ABNORMAL LOW (ref 0.44–1.00)
GFR, Estimated: >60 mL/min (ref >60)
Glucose, Bld: 92 mg/dL (ref 70–99)
Potassium: 3.5 mmol/L (ref 3.5–5.1)
Sodium: 131 mmol/L — ABNORMAL LOW (ref 135–145)
Total Bilirubin: 7.4 mg/dL — ABNORMAL HIGH (ref 0.3–1.2)
Total Protein: 5.7 g/dL — ABNORMAL LOW (ref 6.5–8.1)

## 2021-01-27 LAB — BASIC METABOLIC PANEL
Anion gap: 9 (ref 5–15)
BUN: 8 mg/dL (ref 8–23)
CO2: 24 mmol/L (ref 22–32)
Calcium: 8.1 mg/dL — ABNORMAL LOW (ref 8.9–10.3)
Chloride: 99 mmol/L (ref 98–111)
Creatinine, Ser: 0.46 mg/dL (ref 0.44–1.00)
GFR, Estimated: >60 mL/min (ref >60)
Glucose, Bld: 132 mg/dL — ABNORMAL HIGH (ref 70–99)
Potassium: 4 mmol/L (ref 3.5–5.1)
Sodium: 132 mmol/L — ABNORMAL LOW (ref 135–145)

## 2021-01-27 LAB — TSH: TSH: 1.051 u[IU]/mL (ref 0.350–4.500)

## 2021-01-27 LAB — MAGNESIUM: Magnesium: 2.2 mg/dL (ref 1.7–2.4)

## 2021-01-27 SURGERY — ERCP, WITH INTERVENTION IF INDICATED
Anesthesia: General

## 2021-01-27 MED ORDER — METOPROLOL TARTRATE 5 MG/5ML IV SOLN: 5.0000 mg | INTRAVENOUS | Status: DC | PRN

## 2021-01-27 MED ORDER — INDOMETHACIN 50 MG RE SUPP
RECTAL | Status: DC | PRN
  Administered 2021-01-27: 100 mg via RECTAL

## 2021-01-27 MED ORDER — LACTATED RINGERS IV SOLN: INTRAVENOUS | Status: DC | PRN

## 2021-01-27 MED ORDER — FENTANYL CITRATE (PF) 100 MCG/2ML IJ SOLN
INTRAMUSCULAR | Status: AC
  Filled 2021-01-27: qty 2

## 2021-01-27 MED ORDER — ESMOLOL HCL 100 MG/10ML IV SOLN
INTRAVENOUS | Status: DC | PRN
  Administered 2021-01-27 (×2): 10 mg via INTRAVENOUS

## 2021-01-27 MED ORDER — FENTANYL CITRATE (PF) 100 MCG/2ML IJ SOLN
INTRAMUSCULAR | Status: DC | PRN
  Administered 2021-01-27 (×2): 50 ug via INTRAVENOUS

## 2021-01-27 MED ORDER — ONDANSETRON HCL 4 MG/2ML IJ SOLN
INTRAMUSCULAR | Status: DC | PRN
  Administered 2021-01-27: 4 mg via INTRAVENOUS

## 2021-01-27 MED ORDER — MIDAZOLAM HCL 2 MG/2ML IJ SOLN
INTRAMUSCULAR | Status: AC
  Filled 2021-01-27: qty 2

## 2021-01-27 MED ORDER — SUCCINYLCHOLINE CHLORIDE 200 MG/10ML IV SOSY
PREFILLED_SYRINGE | INTRAVENOUS | Status: DC | PRN
  Administered 2021-01-27: 120 mg via INTRAVENOUS

## 2021-01-27 MED ORDER — MIDAZOLAM HCL 2 MG/2ML IJ SOLN
INTRAMUSCULAR | Status: DC | PRN
  Administered 2021-01-27 (×2): 1 mg via INTRAVENOUS

## 2021-01-27 MED ORDER — GLUCAGON HCL RDNA (DIAGNOSTIC) 1 MG IJ SOLR
INTRAMUSCULAR | Status: AC
  Filled 2021-01-27: qty 2

## 2021-01-27 MED ORDER — PROPOFOL 10 MG/ML IV BOLUS
INTRAVENOUS | Status: DC | PRN
  Administered 2021-01-27: 150 mg via INTRAVENOUS

## 2021-01-27 MED ORDER — DEXAMETHASONE SODIUM PHOSPHATE 10 MG/ML IJ SOLN
INTRAMUSCULAR | Status: DC | PRN
  Administered 2021-01-27: 5 mg via INTRAVENOUS

## 2021-01-27 MED ORDER — LIDOCAINE 2% (20 MG/ML) 5 ML SYRINGE
INTRAMUSCULAR | Status: DC | PRN
  Administered 2021-01-27: 60 mg via INTRAVENOUS

## 2021-01-27 MED ORDER — CHLORHEXIDINE GLUCONATE CLOTH 2 % EX PADS
6.0000 | MEDICATED_PAD | Freq: Every day | CUTANEOUS | Status: DC
  Administered 2021-01-27: 6 via TOPICAL

## 2021-01-27 MED ORDER — PHENYLEPHRINE HCL-NACL 20-0.9 MG/250ML-% IV SOLN
INTRAVENOUS | Status: DC | PRN
  Administered 2021-01-27: 25 ug/min via INTRAVENOUS

## 2021-01-27 MED ORDER — INDOMETHACIN 50 MG RE SUPP
RECTAL | Status: AC
  Filled 2021-01-27: qty 2

## 2021-01-27 MED ORDER — ROCURONIUM BROMIDE 10 MG/ML (PF) SYRINGE
PREFILLED_SYRINGE | INTRAVENOUS | Status: DC | PRN
  Administered 2021-01-27: 20 mg via INTRAVENOUS

## 2021-01-27 MED ORDER — METOPROLOL TARTRATE 25 MG PO TABS
25.0000 mg | ORAL_TABLET | Freq: Two times a day (BID) | ORAL | Status: DC
  Administered 2021-01-27 – 2021-01-28 (×2): 25 mg via ORAL
  Filled 2021-01-27 (×2): qty 1

## 2021-01-27 NOTE — Anesthesia Procedure Notes (Signed)
Date/Time: 01/27/2021 2:03 PM Performed by: Cynda Familia, CRNA Oxygen Delivery Method: Simple face mask Placement Confirmation: positive ETCO2 and breath sounds checked- equal and bilateral Dental Injury: Teeth and Oropharynx as per pre-operative assessment

## 2021-01-27 NOTE — Assessment & Plan Note (Signed)
After ERCP patient developed SVT episode acute. Placed on telemetry was found to have persistent SVT.  Telemetry also mentions possibility of a flutter. Currently will use as needed Lopressor and scheduled Lopressor 25 mg twice daily. Check electrolytes.  Echocardiogram in the morning.  TSH and free T4. Requesting cardiology consult. Due to sphincterotomy patient should not be on any therapeutic anticoagulation for 24 hours.

## 2021-01-27 NOTE — Anesthesia Procedure Notes (Signed)
Procedure Name: Intubation Date/Time: 01/27/2021 1:10 PM Performed by: Cynda Familia, CRNA Pre-anesthesia Checklist: Patient identified, Emergency Drugs available, Suction available and Patient being monitored Patient Re-evaluated:Patient Re-evaluated prior to induction Oxygen Delivery Method: Circle System Utilized Preoxygenation: Pre-oxygenation with 100% oxygen Induction Type: IV induction Ventilation: Mask ventilation without difficulty Laryngoscope Size: Miller and 2 Grade View: Grade II Tube type: Oral Number of attempts: 1 Airway Equipment and Method: Stylet and Oral airway Placement Confirmation: ETT inserted through vocal cords under direct vision, positive ETCO2 and breath sounds checked- equal and bilateral Tube secured with: Tape Dental Injury: Teeth and Oropharynx as per pre-operative assessment  Comments: Smooth IV induction Hatchette- intubation AM CRNA atraumatic-- teeth and mouth as preop- left front chipped and right front cracked prior to laryngoscopy

## 2021-01-27 NOTE — Consult Note (Signed)
Referring Provider: [facility] Primary Care Physician:  Jene Every, MD Primary Gastroenterologist:  Althia Forts  Reason for Consultation:  Elevated LFTs, CBD dilation  HPI: Norma Morales is a 75 y.o. female medical history significant of past medical history of adenocarcinoma presents for CBD dilation and elevated LFTs.  Patient was admitted 11/24 for abdominal pain and LFTs s/p liver biopsy. MCP showed biliary dilation. LFTs normalized and patient was discharged.  Today we are consulted for elevated LFTs. Patient states she was in the cancer center, prepared to receive her first dose of chemo when they did routine lab work which showed elevated LFTs, she was subsequently admitted. She denies abdominal pain, nausea, vomiting. Denies melena/hematochezia. Denies NSAID use. Denies family history of colon cancer or other GI malignancies. Patient states prior to her most recent colonoscopy, her last colonoscopy was in 2018, normal, with a repeat of 10 years. She states most of her previous colonoscopies have been normal or with a few small benign polyps.  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  It also showed dilated intra and extrahepatic biliary tree with narrowing of the central CBD. Sigmoid divreticulosis. Scattered omental implants consistent with metastatic disease  MRI MRCP 01/19/21: showed biliary ductal dilation with smooth narrowing of the common bile duct probably due to extrinsic compression from local lymphadenopathy, liver and peritoneal mets.  Colonoscopy 01/06/21: Colonoscopy showed circumferential mass in the transverse colon.  Biopsy showed Superficial fragments of colonic mucosa with extensive high-grade dysplasia   Past Medical History:  Diagnosis Date   Cancer ([place])    colon   Hyperlipidemia     Past Surgical History:  Procedure  Laterality Date   IR IMAGING GUIDED PORT INSERTION  01/17/2021   IR US GUIDE BX ASP/DRAIN  01/17/2021   TONSILLECTOMY      Prior to Admission medications   Medication Sig Start Date End Date Taking? Authorizing Provider  alendronate (FOSAMAX) 70 MG tablet Take 70 mg by mouth every Sunday. 12/28/20  Yes [provider]  atenolol (TENORMIN) 25 MG tablet Take 25 mg by mouth at bedtime. 06/14/10  Yes [provider]  ezetimibe (ZETIA) 10 MG tablet Take 10 mg by mouth at bedtime. 07/07/20  Yes [provider]  lidocaine-prilocaine (EMLA) cream Apply 1 application topically as needed. Patient taking differently: Apply 1 application topically as needed (for accessing port). 01/23/21  Yes Dede Query T, PA-C  NON FORMULARY Take 30 mLs by mouth See admin instructions. [employer] Gold Multivitamin Liquid-/Healthy Immune System & Well-Being (includes Vitamins D3, B12, K2 & Over 120 Whole Food Nutrients)- Drink 30 ml's by mouth once a day   Yes [provider]  ondansetron (ZOFRAN) 8 MG tablet Take 1 tablet (8 mg total) by mouth every 8 (eight) hours as needed for nausea or vomiting. 01/23/21  Yes Dede Query T, PA-C  oxyCODONE (ROXICODONE) 5 MG immediate release tablet Take 1 tablet (5 mg total) by mouth every 8 (eight) hours as needed. Patient taking differently: Take 5 mg by mouth every 8 (eight) hours as needed (for pain). 01/23/21 01/23/22 Yes Dede Query T, PA-C  prochlorperazine (COMPAZINE) 10 MG tablet Take 1 tablet (10 mg total) by mouth every 6 (six) hours as needed for nausea or vomiting. 01/23/21  Yes Dede Query T, PA-C  ascorbic acid (VITAMIN C) 500 MG tablet Take 500 mg by mouth daily. Patient not taking:  Reported on 01/26/2021    [provider]  Calcium Carbonate (CALCIUM 600 PO) Take 600 mg by mouth daily. Patient not taking: Reported on 01/26/2021    [provider]  Cholecalciferol 25 MCG (1000 UT) capsule Take 1,000 Units by  mouth daily. Patient not taking: Reported on 01/26/2021    [provider]  Coenzyme Q10 (COQ10) 50 MG CAPS Take 50 mg by mouth daily. Patient not taking: Reported on 01/26/2021    [provider]  Glucosamine-Chondroitin (GLUCOSAMINE CHONDR COMPLEX PO) Take 1 tablet by mouth daily. Patient not taking: Reported on 01/26/2021    [provider]  Omega-3 1000 MG CAPS Take 1,000 mg by mouth daily. Patient not taking: Reported on 01/26/2021    [provider]  Probiotic Product (PROBIOTIC BLEND PO) Take 1 capsule by mouth daily. Patient not taking: Reported on 01/26/2021    [provider]  Resveratrol-Quercetin 100-100 MG TABS Take 100 mg by mouth daily. Patient not taking: Reported on 01/26/2021    [provider]  Vitamin E 268 MG (400 UNIT) CAPS Take 400 Units by mouth daily. Patient not taking: Reported on 01/26/2021    [provider]  Zinc 50 MG TABS Take 50 mg by mouth daily. Patient not taking: Reported on 01/26/2021    [provider]    Scheduled Meds:  benzonatate  100 mg Oral TID   Chlorhexidine Gluconate Cloth  6 each Topical Daily   heparin  5,000 Units Subcutaneous Q8H   senna  1 tablet Oral BID   Continuous Infusions:  sodium chloride 100 mL/hr at 01/27/21 0304   piperacillin-tazobactam 3.375 g (01/27/21 0519)   PRN Meds:.ondansetron **OR** ondansetron (ZOFRAN) IV, oxyCODONE, zolpidem  Allergies as of 01/26/2021 - Review Complete 01/26/2021  Allergen Reaction Noted   Aspirin Hives 01/04/2021    History reviewed. No pertinent family history.  Social History   Socioeconomic History   Marital status: Married    Spouse name: Not on file   Number of children: Not on file   Years of education: Not on file   Highest education level: Not on file  Occupational History   Not on file  Tobacco Use   Smoking status: Never   Smokeless tobacco: Never  Substance and Sexual Activity   Alcohol use: Never    Drug use: Never   Sexual activity: Not on file  Other Topics Concern   Not on file  Social History Narrative   Not on file   Social Determinants of Health   Financial Resource Strain: Not on file  Food Insecurity: Not on file  Transportation Needs: Not on file  Physical Activity: Not on file  Stress: Not on file  Social Connections: Not on file  Intimate Partner Violence: Not on file    Review of Systems: Review of Systems  Constitutional:  Negative for chills and fever.  HENT:  Negative for congestion and ear pain.   Eyes:  Negative for blurred vision and photophobia.  Respiratory:  Negative for cough and hemoptysis.   Cardiovascular:  Negative for chest pain and palpitations.  Gastrointestinal:  Negative for abdominal pain, blood in stool, constipation, diarrhea, heartburn, melena, nausea and vomiting.  Genitourinary:  Negative for dysuria and urgency.  Musculoskeletal:  Negative for myalgias and neck pain.  Skin:  Negative for itching and rash.  Neurological:  Negative for seizures and loss of consciousness.  Psychiatric/Behavioral:  Negative for substance abuse. The patient is not nervous/anxious.     Physical  Exam:Physical Exam Constitutional:      General: She is not in acute distress.    Appearance: Normal appearance.  HENT:     Head: Normocephalic and atraumatic.     Nose: Nose normal. No congestion.     Mouth/Throat:     Mouth: Mucous membranes are moist.     Pharynx: Oropharynx is clear.  Eyes:     General: Scleral icterus present.     Extraocular Movements: Extraocular movements intact.  Cardiovascular:     Rate and Rhythm: Normal rate and regular rhythm.  Pulmonary:     Effort: Pulmonary effort is normal. No respiratory distress.  Abdominal:     General: Abdomen is flat. Bowel sounds are normal. There is no distension.     Palpations: Abdomen is soft. There is no mass.     Tenderness: There is no abdominal tenderness. There is no guarding or rebound.      Hernia: No hernia is present.  Musculoskeletal:        General: No swelling. Normal range of motion.     Cervical back: Normal range of motion and neck supple.  Skin:    General: Skin is warm.     Coloration: Skin is jaundiced.  Neurological:     Mental Status: She is alert.  Psychiatric:        Mood and Affect: Mood normal.        Behavior: Behavior normal.        Thought Content: Thought content normal.        Judgment: Judgment normal.    Vital signs: Vitals:   01/27/21 0104 01/27/21 0500  BP: (!) 110/59 119/61  Pulse: 84 77  Resp: 19 20  Temp: 98 F (36.7 C) 98.3 F (36.8 C)  SpO2: 96% 98%   Last BM Date: 01/25/21    GI:  Lab Results: Recent Labs    01/26/21 1204 01/27/21 0509  WBC 14.7* 11.1*  HGB 9.8* 8.8*  HCT 29.4* 26.7*  PLT 505* 453*   BMET Recent Labs    01/26/21 1204 01/27/21 0509  NA 135 131*  K 3.7 3.5  CL 98 99  CO2 25 26  GLUCOSE 133* 92  BUN 9 8  CREATININE 0.65 0.39*  CALCIUM 9.0 7.9*   LFT Recent Labs    01/27/21 0509  PROT 5.7*  ALBUMIN 2.4*  AST 187*  ALT 195*  ALKPHOS 480*  BILITOT 7.4*   PT/INR No results for input(s): LABPROT, INR in the last 72 hours.   Studies/Results: DG Chest 2 View  Result Date: 01/26/2021 CLINICAL DATA:  Cough for 2 days. EXAM: CHEST - 2 VIEW COMPARISON:  CT chest 01/11/2021. CT abdomen and pelvis 01/18/2021. FINDINGS: Linear nodular densities in the left lung base are unchanged. There is no focal lung consolidation, pleural effusion or pneumothorax. The heart is normal in size. Right chest port catheter tip projects over the mid SVC. No acute fractures are identified. IMPRESSION: 1. Stable nodular and linear densities in the left lung base. 2. No new focal lung infiltrate. Electronically Signed   By: Ronney Asters M.D.   On: 01/26/2021 22:18    Impression: Elevated LFTs and CBD dilation; secondary to metastatic colon cancer with liver mets. - AST 187/ ALT 195/ Alk phos 480 (down trending) -  T. Bili 7.4 (9.0 yesterday) - Leukocytosis WBC 11.1 - HGB 8.8 (9.8 yesterday) - MRI MRCP 01/19/21: showed biliary ductal dilation with smooth narrowing of the common bile duct probably due to  extrinsic compression from local lymphadenopathy, liver and peritoneal mets. - Ultrasound guided liver biopsy 01/17/21: pathology showed adenocarcinoma likely from colonic origin - Colonoscopy 01/06/21: showed circumferential mass in the transverse colon.  Biopsy showed Superficial fragments of colonic mucosa with extensive high-grade dysplasia  Adenocarcinoma of colon  Leukocytosis  Plan: Plan for ERCP today. I thoroughly discussed the procedures to include nature, alternatives, benefits, and risks including but not limited to bleeding, perforation, infection, anesthesia/cardiac and pulmonary complications, post ercp pancreatitis. Patient provides understanding and gave verbal consent to proceed.   Continue NPO status   [facility] will follow.      LOS: 1 day   Garnette Scheuermann  PA-C 01/27/2021, 8:14 AM  Contact #  [phone]

## 2021-01-27 NOTE — Transfer of Care (Signed)
Immediate Anesthesia Transfer of Care Note  Patient: Norma Morales  Procedure(s) Performed: ENDOSCOPIC RETROGRADE CHOLANGIOPANCREATOGRAPHY (ERCP) SPHINCTEROTOMY BILIARY STENT PLACEMENT  Patient Location: Endo PACU  Anesthesia Type:General  Level of Consciousness: awake and alert   Airway & Oxygen Therapy: Patient Spontanous Breathing and Patient connected to face mask oxygen  Post-op Assessment: Report given to RN and Post -op Vital signs reviewed and stable  Post vital signs: Reviewed and stable  Last Vitals:  Vitals Value Taken Time  BP    Temp    Pulse 91 01/27/21 1411  Resp 20 01/27/21 1411  SpO2 99 % 01/27/21 1411  Vitals shown include unvalidated device data.  Last Pain:  Vitals:   01/27/21 1203  TempSrc: Oral  PainSc: 0-No pain         Complications: No notable events documented.

## 2021-01-27 NOTE — Hospital Course (Signed)
12/1 admitted to the hospital secondary to elevated bilirubin levels. 12/2 GI consulted underwent ERCP with bare-metal stent placement.  Developed SVT.

## 2021-01-27 NOTE — Progress Notes (Signed)
Initial Nutrition Assessment  INTERVENTION:   Once diet advanced: -Ensure MAX Protein po BID, each supplement provides 150 kcal and 30 grams of protein  -Multivitamin with minerals daily  -Family can bring in supplements that pt prefers Nurse, mental health Protein)  NUTRITION DIAGNOSIS:   Increased nutrient needs related to cancer and cancer related treatments as evidenced by estimated needs.  GOAL:   Patient will meet greater than or equal to 90% of their needs  MONITOR:   PO intake, Diet advancement, Labs, Weight trends, I & O's, Skin  REASON FOR ASSESSMENT:   Malnutrition Screening Tool    ASSESSMENT:   75 y.o. female with medical history significant of past medical history of adenocarcinoma with recent admission for consult for abdominal pain as well as elevated LFT.  Now presents to the hospital with complaints of abnormal lab.  Patient in room, family members at bedsde. Pt currently NPO, awaiting ERCP. Pt is visibly jaundiced.  Had spoken to [facility] RD on 11/29 regarding nutrition status. Pt reports to this RD that she has not consumed a substantial meal in 2 weeks. Last PO she consumed was some ice cream, hot chocolate and pudding when she first got to the hospital.  She tries to drink 2 Premier Protein supplements daily but was only getting 1 down most days. She is willing to try Ensure Max but family agreed they could bring Premier if pt prefers.   Per weight records, pt has lost 7 lbs since 11/28, this is a 4% wt loss over 4 days.   Medications: senokot  Labs reviewed:  Low Na  NUTRITION - FOCUSED PHYSICAL EXAM:  Pt being taken to endoscopy, will attempt at follow-up  Diet Order:   Diet Order             Diet NPO time specified Except for: Ice Chips, Sips with Meds  Diet effective midnight                   EDUCATION NEEDS:   No education needs have been identified at this time  Skin:  Skin Assessment: Reviewed RN Assessment  Last BM:   11/30  Height:   Ht Readings from Last 1 Encounters:  01/26/21 5\' 3"  (1.6 m)    Weight:   Wt Readings from Last 1 Encounters:  01/26/21 62.7 kg    BMI:  Body mass index is 24.49 kg/m.  Estimated Nutritional Needs:   Kcal:  1900-2100  Protein:  95-110g  Fluid:  2.1L/day  Clayton Bibles, MS, RD, LDN Inpatient Clinical Dietitian Contact information available via Amion

## 2021-01-27 NOTE — Anesthesia Preprocedure Evaluation (Signed)
Anesthesia Evaluation  Patient identified by MRN, date of birth, ID band Patient awake    Reviewed: Allergy & Precautions, NPO status , Patient's Chart, lab work & pertinent test results  Airway Mallampati: I       Dental no notable dental hx.    Pulmonary neg pulmonary ROS,    Pulmonary exam normal        Cardiovascular negative cardio ROS Normal cardiovascular exam     Neuro/Psych negative neurological ROS  negative psych ROS   GI/Hepatic Neg liver ROS,   Endo/Other  negative endocrine ROS  Renal/GU negative Renal ROS  negative genitourinary   Musculoskeletal negative musculoskeletal ROS (+)   Abdominal Normal abdominal exam  (+)   Peds  Hematology  (+) anemia ,   Anesthesia Other Findings   Reproductive/Obstetrics                             Anesthesia Physical Anesthesia Plan  ASA: 2  Anesthesia Plan: General   Post-op Pain Management:    Induction: Intravenous  PONV Risk Score and Plan: 3 and Ondansetron, Dexamethasone and Midazolam  Airway Management Planned: Oral ETT  Additional Equipment: None  Intra-op Plan:   Post-operative Plan: Extubation in OR  Informed Consent: I have reviewed the patients History and Physical, chart, labs and discussed the procedure including the risks, benefits and alternatives for the proposed anesthesia with the patient or authorized representative who has indicated his/her understanding and acceptance.     Dental advisory given  Plan Discussed with: CRNA  Anesthesia Plan Comments:         Anesthesia Quick Evaluation

## 2021-01-27 NOTE — Anesthesia Postprocedure Evaluation (Signed)
Anesthesia Post Note  Patient: Norma Morales  Procedure(s) Performed: ENDOSCOPIC RETROGRADE CHOLANGIOPANCREATOGRAPHY (ERCP) [place]     Patient location during evaluation: Endoscopy Anesthesia Type: General Level of consciousness: awake and sedated Pain management: pain level controlled Vital Signs Assessment: post-procedure vital signs reviewed and stable Respiratory status: spontaneous breathing Cardiovascular status: stable Postop Assessment: no apparent nausea or vomiting Anesthetic complications: no   No notable events documented.  Last Vitals:  Vitals:   01/27/21 1420 01/27/21 1430  BP: (!) 126/49 (!) 148/59  Pulse: 99 94  Resp: (!) 21 20  Temp:    SpO2: 96% 96%    Last Pain:  Vitals:   01/27/21 1430  TempSrc:   PainSc: 0-No pain   Pain Goal:                   Huston Foley

## 2021-01-27 NOTE — Op Note (Signed)
[facility] Patient Name: Norma Morales Procedure Date: 01/27/2021 MRN: [id number] Attending MD: Clarene Essex , MD Date of Birth: 11/05/1945 CSN: [id number] Age: 75 Admit Type: Inpatient Procedure:                ERCP Indications:              Abnormal MRCP, Jaundice Providers:                Clarene Essex, MD, Grace Isaac, RN, [facility]                            Technician, Technician Referring MD:              Medicines:                General Anesthesia Complications:            No immediate complications. Short-lived bout of SVT                            as above Estimated Blood Loss:     Estimated blood loss: none. Procedure:                Pre-Anesthesia Assessment:                           - Prior to the procedure, a History and Physical                            was performed, and patient medications and                            allergies were reviewed. The patient's tolerance of                            previous anesthesia was also reviewed. The risks                            and benefits of the procedure and the sedation                            options and risks were discussed with the patient.                            All questions were answered, and informed consent                            was obtained. Prior Anticoagulants: The patient has                            taken no previous anticoagulant or antiplatelet                            agents. ASA Grade Assessment: II - A patient with                            mild systemic disease.  After reviewing the risks                            and benefits, the patient was deemed in                            satisfactory condition to undergo the procedure.                           After obtaining informed consent, the scope was                            passed under direct vision. Throughout the                            procedure, the patient's blood pressure, pulse, and                             oxygen saturations were monitored continuously. The                            [id number]-[id number] ([id number] Olympus duodenoscope was                            introduced through the mouth, and used to inject                            contrast into and used to cannulate the bile duct.                            The ERCP was somewhat difficult due to abnormal                            anatomy. Successful completion of the procedure was                            aided by performing the maneuvers documented                            (below) in this report. The patient tolerated the                            procedure well. Scope In: Scope Out: Findings:      The major papilla was normal. 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 There was no       post-sphincterotomy bleeding. We did a medium large sphincterotomy until  we had adequate dye and bilious drainage and could get the three       quarters bowed sphincterotome easily in and out of the duct and to       better delineate the anatomy and better categorize the stricture the       biliary tree was swept with a 12 mm balloon starting at the bifurcation.       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 We then placed one 10 Fr by 6 cm       uncovered metal stent with no external flaps and no internal flaps was       placed 5.5 cm into the common bile duct. Bile and clear fluid flowed       through the stent. The stent was in good position. The scope introducer       and wire were removed and there was no pancreatic duct injection       throughout the procedure however the wire did go one time towards the       pancreas although were not sure it went into the pancreas otherwise she       did have a bout of SVP during the procedure which resolved readily with       anesthesia medicines Impression:               - The major papilla appeared normal.                           - A biliary sphincterotomy was performed.                           - The biliary tree was swept and nothing was found                            except for the mid duct stricture.                           - One uncovered metal stent was placed into the                            common bile duct. Moderate Sedation:      Not Applicable - Patient had care per Anesthesia. Recommendation:           - Clear liquid diet for 6 hours. If doing well may                            have soft solids at 8 PM                           -  Continue present medications.                           - Check liver enzymes (AST, ALT, alkaline                            phosphatase, bilirubin) in the morning. And follow                            to near normal or when okay to proceed with                            chemotherapy per oncology                           - Return to GI clinic PRN. My partner Dr. Therisa Doyne                            will check on tomorrow                           - Telephone GI clinic if symptomatic PRN. Procedure Code(s):        --- Professional ---                           [phone], Endoscopic retrograde                             cholangiopancreatography (ERCP); with placement of                            endoscopic stent into biliary or pancreatic duct,                            including pre- and post-dilation and guide wire                            passage, when performed, including sphincterotomy,                            when performed, each stent Diagnosis Code(s):        --- Professional ---                           R17, Unspecified jaundice                           R93.2, Abnormal findings on diagnostic imaging of                            liver and biliary tract CPT copyright 2019 [employer]. All rights reserved. The codes documented in this report are preliminary and upon coder review may  be revised to meet current compliance requirements. Clarene Essex, MD 01/27/2021 2:02:40 PM This report has been signed electronically. Number of Addenda: 0

## 2021-01-27 NOTE — Progress Notes (Signed)
[facility] Progress Note  Patient: Norma Morales    DEY:[id number]  DOA: 01/26/2021    Date of Service: the patient was seen and examined on 01/27/2021  Brief hospital course: 12/1 admitted to the hospital secondary to elevated bilirubin levels. 12/2 GI consulted underwent ERCP with bare-metal stent placement.  Developed SVT.  Assessment and Plan: * Common biliary duct stricture [facility] consulted.  Bilirubin level elevated significantly from normal 1 week ago to 9 with jaundice.  Most likely from extrinsic compression of the CBD seen on the MRI. Patient underwent ERCP.  Uncovered stent was placed.  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 Clear liquid diet advance to soft diet if tolerates. No anticoagulation for 24 hours. Recheck LFT in the morning.  Transaminitis Along with hyperbilirubinemia. LFT has been chronically elevated but now mildly worsened. No evidence of cholangitis on exam. Continue IV hydration.  Avoid hepatotoxic medication.    SVT (supraventricular tachycardia) ([place]) After ERCP patient developed SVT episode acute. Placed on telemetry was found to have persistent SVT.  Telemetry also mentions possibility of a flutter. Currently will use as needed Lopressor and scheduled Lopressor 25 mg twice daily. Check electrolytes.  Echocardiogram in the morning.  TSH and free T4. Requesting cardiology consult. Due to sphincterotomy patient should not be on any therapeutic anticoagulation for 24 hours.  Cough Etiology unclear. Patient has cough ongoing for 3 months progressively worsening. Will treat symptomatically. Already on antibiotics for intra-abdominal infection. Chest x-ray negative for any pneumonia.  Leucocytosis WBC count elevated.  Patient does not have any evidence of fever or chills.  Unsure of the etiology. Will empirically cover with antibiotics given that her bilirubin has risen further.  Will discuss with GI regarding need for  antibiotic.  Anemia associated with chemotherapy H&H relatively stable but on the lower side.  Monitor.  Adenocarcinoma of colon ([place]) Follows up with oncology. Management per Dr. Lorenso Courier.    Body mass index is 24.49 kg/m.  Nutrition Problem: Increased nutrient needs Etiology: cancer and cancer related treatments     Subjective: Denies any acute complaint.  No chest pain no dizziness no lightheadedness.  No nausea no vomiting.  No abdominal pain.  Passing gas. Was in SVT, improved with going to the bathroom.  Objective: Tachycardic  Exam: General: Appear in mild distress, no Rash; Oral Mucosa Clear, moist. no Abnormal Neck Mass Or lumps, Conjunctiva normal  Cardiovascular: S1 and S2 Present, no Murmur, Respiratory: good respiratory effort, Bilateral Air entry present and CTA, no Crackles, no wheezes Abdomen: Bowel Sound present, Soft and no tenderness Extremities: no Pedal edema Neurology: Drowsy postprocedure and oriented to time, place, and person affect appropriate. no new focal deficit Gait not checked due to patient safety concerns     Data Reviewed: My review of labs, imaging, notes and other tests shows no new significant findings.   Disposition:  Status is: Inpatient  Remains inpatient appropriate because: Ongoing SVT, SP ERCP still on liquid diet and IV antibiotics  Family Communication: Husband at bedside  DVT Prophylaxis: heparin injection 5,000 Units Start: 01/26/21 2200   Time spent: 35 minutes.   Author: Berle Mull  01/27/2021 5:15 PM  To reach On-call, see care teams to locate the attending and reach out via [email or web address]. Between 7PM-7AM, please contact night-coverage If you still have difficulty reaching the attending provider, please page the [facility] (Director on Call) for [facility] on amion for assistance.

## 2021-01-27 NOTE — Progress Notes (Signed)
Please see procedure note for details but patient had a short-lived bout of SVT during her procedure and will request a monitored bed and will ask hospital team to consider consulting cardiology

## 2021-01-28 ENCOUNTER — Inpatient Hospital Stay (HOSPITAL_COMMUNITY): Payer: Medicare Other

## 2021-01-28 ENCOUNTER — Inpatient Hospital Stay: Payer: Medicare Other

## 2021-01-28 DIAGNOSIS — I471 Supraventricular tachycardia: Secondary | ICD-10-CM

## 2021-01-28 DIAGNOSIS — K831 Obstruction of bile duct: Secondary | ICD-10-CM | POA: Diagnosis not present

## 2021-01-28 LAB — MAGNESIUM: Magnesium: 2.2 mg/dL (ref 1.7–2.4)

## 2021-01-28 LAB — T4, FREE: Free T4: 1 ng/dL (ref 0.61–1.12)

## 2021-01-28 LAB — CBC WITH DIFFERENTIAL/PLATELET
Abs Immature Granulocytes: 0.09 10*3/uL — ABNORMAL HIGH (ref 0.00–0.07)
Basophils Absolute: 0 10*3/uL (ref 0.0–0.1)
Basophils Relative: 0 %
Eosinophils Absolute: 0 10*3/uL (ref 0.0–0.5)
Eosinophils Relative: 0 %
HCT: 28.5 % — ABNORMAL LOW (ref 36.0–46.0)
Hemoglobin: 9 g/dL — ABNORMAL LOW (ref 12.0–15.0)
Immature Granulocytes: 1 %
Lymphocytes Relative: 7 %
Lymphs Abs: 0.8 10*3/uL (ref 0.7–4.0)
MCH: 25.8 pg — ABNORMAL LOW (ref 26.0–34.0)
MCHC: 31.6 g/dL (ref 30.0–36.0)
MCV: 81.7 fL (ref 80.0–100.0)
Monocytes Absolute: 1.1 10*3/uL — ABNORMAL HIGH (ref 0.1–1.0)
Monocytes Relative: 9 %
Neutro Abs: 10 10*3/uL — ABNORMAL HIGH (ref 1.7–7.7)
Neutrophils Relative %: 83 %
Platelets: 470 10*3/uL — ABNORMAL HIGH (ref 150–400)
RBC: 3.49 MIL/uL — ABNORMAL LOW (ref 3.87–5.11)
RDW: 15.9 % — ABNORMAL HIGH (ref 11.5–15.5)
WBC: 12 10*3/uL — ABNORMAL HIGH (ref 4.0–10.5)
nRBC: 0 % (ref 0.0–0.2)

## 2021-01-28 LAB — COMPREHENSIVE METABOLIC PANEL
ALT: 156 U/L — ABNORMAL HIGH (ref 0–44)
AST: 114 U/L — ABNORMAL HIGH (ref 15–41)
Albumin: 2.4 g/dL — ABNORMAL LOW (ref 3.5–5.0)
Alkaline Phosphatase: 430 U/L — ABNORMAL HIGH (ref 38–126)
Anion gap: 6 (ref 5–15)
BUN: 10 mg/dL (ref 8–23)
CO2: 27 mmol/L (ref 22–32)
Calcium: 8.1 mg/dL — ABNORMAL LOW (ref 8.9–10.3)
Chloride: 101 mmol/L (ref 98–111)
Creatinine, Ser: 0.5 mg/dL (ref 0.44–1.00)
GFR, Estimated: >60 mL/min (ref >60)
Glucose, Bld: 120 mg/dL — ABNORMAL HIGH (ref 70–99)
Potassium: 3.9 mmol/L (ref 3.5–5.1)
Sodium: 134 mmol/L — ABNORMAL LOW (ref 135–145)
Total Bilirubin: 4.1 mg/dL — ABNORMAL HIGH (ref 0.3–1.2)
Total Protein: 5.8 g/dL — ABNORMAL LOW (ref 6.5–8.1)

## 2021-01-28 LAB — ECHOCARDIOGRAM COMPLETE
Area-P 1/2: 4.39 cm2
Height: 63 in
S' Lateral: 2.3 cm
Weight: 2211.65 oz

## 2021-01-28 MED ORDER — HEPARIN SOD (PORK) LOCK FLUSH 100 UNIT/ML IV SOLN
500.0000 [IU] | Freq: Once | INTRAVENOUS | Status: DC
  Filled 2021-01-28: qty 5

## 2021-01-28 MED ORDER — BENZONATATE 100 MG PO CAPS: 100.0000 mg | ORAL_CAPSULE | Freq: Three times a day (TID) | ORAL | 0 refills | Status: DC

## 2021-01-28 MED ORDER — HEPARIN SOD (PORK) LOCK FLUSH 100 UNIT/ML IV SOLN: 500.0000 [IU] | Freq: Once | INTRAVENOUS | Status: DC

## 2021-01-28 MED ORDER — SENNA 8.6 MG PO TABS: 1.0000 | ORAL_TABLET | Freq: Every day | ORAL | 0 refills | Status: AC | PRN

## 2021-01-28 NOTE — Consult Note (Signed)
Cardiology Consultation:   Patient ID: Norma Morales MRN: [id number]; DOB: February 12, 1946  Admit date: 01/26/2021 Date of Consult: 01/28/2021  PCP:  Jene Every, MD   [facility] Providers Cardiologist:  None        Patient Profile:   Norma Morales is a 75 y.o. female with a hx of elevated bili who is being seen 01/28/2021 for the evaluation of SVT at the request of Dr Posey Pronto.  History of Present Illness:   [name] is a 75 yo with hx of adenocarcinoma .  Admitted for elevated LFTs  Yesterday  underwetn ERCP   Post procedure deveiloped SVT    Pt says that back in the late 2000s she had episodes of heart racing    She attrib to stress   Was woking at the time   She says the spells would last minutes, break on their own    No presyncope or syncope  She was seen by a cardiologist in [facility]     May have had a heart catheterization   Michela Pitcher it was OK   Placed on atenolol    Has not had any since until now   The pt denies CP   Breaghing is OK  No Hx of syncope    Past Medical History:  Diagnosis Date   Cancer ([place])    colon   Hyperlipidemia     Past Surgical History:  Procedure Laterality Date   IR IMAGING GUIDED PORT INSERTION  01/17/2021   IR US GUIDE BX ASP/DRAIN  01/17/2021   TONSILLECTOMY         Inpatient Medications: Scheduled Meds:  benzonatate  100 mg Oral TID   Chlorhexidine Gluconate Cloth  6 each Topical Daily   heparin  5,000 Units Subcutaneous Q8H   metoprolol tartrate  25 mg Oral BID   senna  1 tablet Oral BID   Continuous Infusions:  sodium chloride 125 mL/hr at 01/27/21 1640   piperacillin-tazobactam 3.375 g (01/28/21 0450)   PRN Meds: metoprolol tartrate, ondansetron **OR** ondansetron (ZOFRAN) IV, oxyCODONE, zolpidem  Allergies:    Allergies  Allergen Reactions   Aspirin Hives    Social History:   Social History   Socioeconomic History   Marital status: Married    Spouse name: Not on file   Number of children: Not on file   Years of  education: Not on file   Highest education level: Not on file  Occupational History   Not on file  Tobacco Use   Smoking status: Never   Smokeless tobacco: Never  Substance and Sexual Activity   Alcohol use: Never   Drug use: Never   Sexual activity: Not on file  Other Topics Concern   Not on file  Social History Narrative   Not on file   Social Determinants of Health   Financial Resource Strain: Not on file  Food Insecurity: Not on file  Transportation Needs: Not on file  Physical Activity: Not on file  Stress: Not on file  Social Connections: Not on file  Intimate Partner Violence: Not on file    Family History:   Fhx of CAD   ROS:  Please see the history of present illness.   All other ROS reviewed and negative.     Physical Exam/Data:   Vitals:   01/27/21 1430 01/27/21 1437 01/27/21 2045 01/28/21 0455  BP: (!) 148/59 (!) 142/52 129/75 135/73  Pulse: 94 87 73 (!) 54  Resp: 20 17 18 18   Temp:   Marland Kitchen)  97.3 F (36.3 C)   TempSrc:   Oral   SpO2: 96% 96% 94% 99%  Weight:      Height:        Intake/Output Summary (Last 24 hours) at 01/28/2021 0752 Last data filed at 01/27/2021 1404 Gross per 24 hour  Intake 800 ml  Output 0 ml  Net 800 ml   Last 3 Weights 01/26/2021 01/26/2021 01/23/2021  Weight (lbs) 138 lb 3.7 oz 145 lb 12 oz 145 lb 8 oz  Weight (kg) 62.7 kg 66.112 kg 65.998 kg     Body mass index is 24.49 kg/m.  General:  Well nourished, well developed, in no acute distress HEENT: normal Neck: no JVD Vascular: No carotid bruits; Distal pulses 2+ bilaterally Cardiac:  normal S1, S2; RRR; no murmur  Lungs:  clear to auscultation bilaterally, no wheezing, rhonchi or rales  Abd: soft, nontender, no hepatomegaly  Ext: no edema Musculoskeletal:  No deformities, BUE and BLE strength normal and equal Skin: warm and dry  Neuro:  CNs 2-12 intact, no focal abnormalities noted Psych:  Normal affect   EKG:  The EKG was personally reviewed and demonstrates:    SVT 131 bpm   Long RP   Probable atrial tacycardia vs atypical atrial flutter vs unusual AVNRT  Telemetry:  Telemetry was personally reviewed and demonstrates:  SR now   About 20 min SVT yesterday      Relevant CV Studies: None   Laboratory Data:  High Sensitivity Troponin:  No results for input(s): TROPONINIHS in the last 720 hours.   Chemistry Recent Labs  Lab 01/27/21 0509 01/27/21 1834 01/28/21 0444  NA 131* 132* 134*  K 3.5 4.0 3.9  CL 99 99 101  CO2 26 24 27   GLUCOSE 92 132* 120*  BUN 8 8 10   CREATININE 0.39* 0.46 0.50  CALCIUM 7.9* 8.1* 8.1*  MG  --  2.2 2.2  GFRNONAA >60 >60 >60  ANIONGAP 6 9 6     Recent Labs  Lab 01/26/21 1204 01/27/21 0509 01/28/21 0444  PROT 6.8 5.7* 5.8*  ALBUMIN 2.7* 2.4* 2.4*  AST 252* 187* 114*  ALT 282* 195* 156*  ALKPHOS 666* 480* 430*  BILITOT 9.0* 7.4* 4.1*   Lipids No results for input(s): CHOL, TRIG, HDL, LABVLDL, LDLCALC, CHOLHDL in the last 168 hours.  Hematology Recent Labs  Lab 01/26/21 1204 01/27/21 0509 01/28/21 0444  WBC 14.7* 11.1* 12.0*  RBC 3.74* 3.33* 3.49*  HGB 9.8* 8.8* 9.0*  HCT 29.4* 26.7* 28.5*  MCV 78.6* 80.2 81.7  MCH 26.2 26.4 25.8*  MCHC 33.3 33.0 31.6  RDW 15.0 15.2 15.9*  PLT 505* 453* 470*   Thyroid  Recent Labs  Lab 01/27/21 1834  TSH 1.051  FREET4 1.00    BNPNo results for input(s): BNP, PROBNP in the last 168 hours.  DDimer No results for input(s): DDIMER in the last 168 hours.   Radiology/Studies:  DG Chest 2 View  Result Date: 01/26/2021 CLINICAL DATA:  Cough for 2 days. EXAM: CHEST - 2 VIEW COMPARISON:  CT chest 01/11/2021. CT abdomen and pelvis 01/18/2021. FINDINGS: Linear nodular densities in the left lung base are unchanged. There is no focal lung consolidation, pleural effusion or pneumothorax. The heart is normal in size. Right chest port catheter tip projects over the mid SVC. No acute fractures are identified. IMPRESSION: 1. Stable nodular and linear densities in the left  lung base. 2. No new focal lung infiltrate. Electronically Signed   By: Ronney Asters  M.D.   On: 01/26/2021 22:18   DG ERCP WITH SPHINCTEROTOMY  Result Date: 01/27/2021 CLINICAL DATA:  ERCP with sphincterotomy and stent placement. EXAM: ERCP TECHNIQUE: Multiple spot images obtained with the fluoroscopic device and submitted for interpretation post-procedure. FLUOROSCOPY TIME:  4 minutes, 51 seconds COMPARISON:  MRCP-01/19/2021 FINDINGS: 14 spot intraoperative fluoroscopic images of the right upper abdominal quadrant during ERCP are provided for review. Initial image demonstrates an ERCP probe overlying the right upper abdominal quadrant Subsequent images demonstrate opacification of the central aspect of the biliary tree which appears markedly dilated with non opacification and apparent malignant occlusion involving the majority of the CBD. Completion images demonstrates placement of an internal biliary stent traversing the CBD with residual nonocclusive narrowing at its mid aspect. IMPRESSION: ERCP with biliary stenting as above. These images were submitted for radiologic interpretation only. Please see the procedural report for the amount of contrast and the fluoroscopy time utilized. Electronically Signed   By: Sandi Mariscal M.D.   On: 01/27/2021 14:17     Assessment and Plan:   SVT   Pt with hx of palpitaitons, probable SVT in past    Presented for ERCP and had selflmiited spell yesterday     SHe has never had syncope     Most likely pt has an atrial tachycardia     She had done well on b blocker Rx    I would ocntinue    Not clear when / if she will have another spell Discussed with her    Can review/reassess if recurs.    She was on atenolol 25 mg daily at home   Now on metoprolol 25 bid    Can use either      2  Hx HL   Pt is on Zetia at home    3  Adenoca of colon   Metastatic   Pt being followed by GI and oncology   [phone]   Will be available as needed   COntinue tele     Call /reconsult    if needed.       For questions or updates, please contact [place] Please consult [email or web address] for contact info under    Signed, Dorris Carnes, MD  01/28/2021 7:52 AM

## 2021-01-28 NOTE — Progress Notes (Signed)
Subjective: Patient doing well, has been passing flatus, denies abdominal pain, denies fever, nausea vomiting.  Objective: Vital signs in last 24 hours: Temp:  [97.3 F (36.3 C)-98.1 F (36.7 C)] 97.3 F (36.3 C) (12/02 2045) Pulse Rate:  [54-99] 75 (12/03 1008) Resp:  [13-21] 13 (12/03 0800) BP: (113-148)/(49-75) 135/73 (12/03 0455) SpO2:  [94 %-99 %] 99 % (12/03 0455) Weight change:  Last BM Date: 01/25/21  PE: Mild icterus, mild pallor GENERAL: Not in distress, oriented x3, able to speak in full sentences  ABDOMEN: Soft, nondistended, nontender, normoactive bowel sounds EXTREMITIES: No deformity  Lab Results: Results for orders placed or performed during the hospital encounter of 01/26/21 (from the past 48 hour(s))  Comprehensive metabolic panel     Status: Abnormal   Collection Time: 01/27/21  5:09 AM  Result Value Ref Range   Sodium 131 (L) 135 - 145 mmol/L   Potassium 3.5 3.5 - 5.1 mmol/L   Chloride 99 98 - 111 mmol/L   CO2 26 22 - 32 mmol/L   Glucose, Bld 92 70 - 99 mg/dL    Comment: Glucose reference range applies only to samples taken after fasting for at least 8 hours.   BUN 8 8 - 23 mg/dL   Creatinine, Ser 0.39 (L) 0.44 - 1.00 mg/dL   Calcium 7.9 (L) 8.9 - 10.3 mg/dL   Total Protein 5.7 (L) 6.5 - 8.1 g/dL   Albumin 2.4 (L) 3.5 - 5.0 g/dL   AST 187 (H) 15 - 41 U/L   ALT 195 (H) 0 - 44 U/L   Alkaline Phosphatase 480 (H) 38 - 126 U/L   Total Bilirubin 7.4 (H) 0.3 - 1.2 mg/dL   GFR, Estimated >60 >60 mL/min    Comment: (NOTE) Calculated using the CKD-EPI Creatinine Equation (2021)    Anion gap 6 5 - 15    Comment: Performed at [facility], [place] [address], [place], [place] [address]  CBC     Status: Abnormal   Collection Time: 01/27/21  5:09 AM  Result Value Ref Range   WBC 11.1 (H) 4.0 - 10.5 K/uL   RBC 3.33 (L) 3.87 - 5.11 MIL/uL   Hemoglobin 8.8 (L) 12.0 - 15.0 g/dL   HCT 26.7 (L) 36.0 - 46.0 %   MCV 80.2 80.0 - 100.0 fL   MCH 26.4  26.0 - 34.0 pg   MCHC 33.0 30.0 - 36.0 g/dL   RDW 15.2 11.5 - 15.5 %   Platelets 453 (H) 150 - 400 K/uL   nRBC 0.0 0.0 - 0.2 %    Comment: Performed at [facility], [place] [address] [place], [place] [address]  TSH     Status: None   Collection Time: 01/27/21  6:34 PM  Result Value Ref Range   TSH 1.051 0.350 - 4.500 uIU/mL    Comment: Performed by a 3rd Generation assay with a functional sensitivity of <=0.01 uIU/mL. Performed at [facility], [place] [address], [place], [place] [address]   T4, free     Status: None   Collection Time: 01/27/21  6:34 PM  Result Value Ref Range   Free T4 1.00 0.61 - 1.12 ng/dL    Comment: (NOTE) Biotin ingestion may interfere with free T4 tests. If the results are inconsistent with the TSH level, previous test results, or the clinical presentation, then consider biotin interference. If needed, order repeat testing after stopping biotin. Performed at [facility], [place] [address],  [place], [place] [address]   Basic metabolic panel     Status: Abnormal   Collection Time: 01/27/21  6:34 PM  Result Value Ref Range   Sodium 132 (L) 135 - 145 mmol/L   Potassium 4.0 3.5 - 5.1 mmol/L   Chloride 99 98 - 111 mmol/L   CO2 24 22 - 32 mmol/L   Glucose, Bld 132 (H) 70 - 99 mg/dL    Comment: Glucose reference range applies only to samples taken after fasting for at least 8 hours.   BUN 8 8 - 23 mg/dL   Creatinine, Ser 0.46 0.44 - 1.00 mg/dL   Calcium 8.1 (L) 8.9 - 10.3 mg/dL   GFR, Estimated >60 >60 mL/min    Comment: (NOTE) Calculated using the CKD-EPI Creatinine Equation (2021)    Anion gap 9 5 - 15    Comment: Performed at [facility], [place] [address], [place], [place] [address]  Magnesium     Status: None   Collection Time: 01/27/21  6:34 PM  Result Value Ref Range   Magnesium 2.2 1.7 - 2.4 mg/dL    Comment: Performed at [facility], [place] [address], [place], [place]  [address]  CBC with Differential/Platelet     Status: Abnormal   Collection Time: 01/28/21  4:44 AM  Result Value Ref Range   WBC 12.0 (H) 4.0 - 10.5 K/uL   RBC 3.49 (L) 3.87 - 5.11 MIL/uL   Hemoglobin 9.0 (L) 12.0 - 15.0 g/dL   HCT 28.5 (L) 36.0 - 46.0 %   MCV 81.7 80.0 - 100.0 fL   MCH 25.8 (L) 26.0 - 34.0 pg   MCHC 31.6 30.0 - 36.0 g/dL   RDW 15.9 (H) 11.5 - 15.5 %   Platelets 470 (H) 150 - 400 K/uL   nRBC 0.0 0.0 - 0.2 %   Neutrophils Relative % 83 %   Neutro Abs 10.0 (H) 1.7 - 7.7 K/uL   Lymphocytes Relative 7 %   Lymphs Abs 0.8 0.7 - 4.0 K/uL   Monocytes Relative 9 %   Monocytes Absolute 1.1 (H) 0.1 - 1.0 K/uL   Eosinophils Relative 0 %   Eosinophils Absolute 0.0 0.0 - 0.5 K/uL   Basophils Relative 0 %   Basophils Absolute 0.0 0.0 - 0.1 K/uL   Immature Granulocytes 1 %   Abs Immature Granulocytes 0.09 (H) 0.00 - 0.07 K/uL    Comment: Performed at [facility], [place] [address], [place], [place] [address]  Comprehensive metabolic panel     Status: Abnormal   Collection Time: 01/28/21  4:44 AM  Result Value Ref Range   Sodium 134 (L) 135 - 145 mmol/L   Potassium 3.9 3.5 - 5.1 mmol/L   Chloride 101 98 - 111 mmol/L   CO2 27 22 - 32 mmol/L   Glucose, Bld 120 (H) 70 - 99 mg/dL    Comment: Glucose reference range applies only to samples taken after fasting for at least 8 hours.   BUN 10 8 - 23 mg/dL   Creatinine, Ser 0.50 0.44 - 1.00 mg/dL   Calcium 8.1 (L) 8.9 - 10.3 mg/dL   Total Protein 5.8 (L) 6.5 - 8.1 g/dL   Albumin 2.4 (L) 3.5 - 5.0 g/dL   AST 114 (H) 15 - 41 U/L   ALT 156 (H) 0 - 44 U/L   Alkaline Phosphatase 430 (H) 38 - 126 U/L   Total Bilirubin 4.1 (H) 0.3 - 1.2 mg/dL   GFR,  Estimated >60 >60 mL/min    Comment: (NOTE) Calculated using the CKD-EPI Creatinine Equation (2021)    Anion gap 6 5 - 15    Comment: Performed at [facility], [place] [address], [place], [place] [address]  Magnesium     Status: None   Collection Time:  01/28/21  4:44 AM  Result Value Ref Range   Magnesium 2.2 1.7 - 2.4 mg/dL    Comment: Performed at [facility], [place] [address] [place], [place] [address]    Studies/Results: DG Chest 2 View  Result Date: 01/26/2021 CLINICAL DATA:  Cough for 2 days. EXAM: CHEST - 2 VIEW COMPARISON:  CT chest 01/11/2021. CT abdomen and pelvis 01/18/2021. FINDINGS: Linear nodular densities in the left lung base are unchanged. There is no focal lung consolidation, pleural effusion or pneumothorax. The heart is normal in size. Right chest port catheter tip projects over the mid SVC. No acute fractures are identified. IMPRESSION: 1. Stable nodular and linear densities in the left lung base. 2. No new focal lung infiltrate. Electronically Signed   By: Ronney Asters M.D.   On: 01/26/2021 22:18   DG ERCP WITH SPHINCTEROTOMY  Result Date: 01/27/2021 CLINICAL DATA:  ERCP with sphincterotomy and stent placement. EXAM: ERCP TECHNIQUE: Multiple spot images obtained with the fluoroscopic device and submitted for interpretation post-procedure. FLUOROSCOPY TIME:  4 minutes, 51 seconds COMPARISON:  MRCP-01/19/2021 FINDINGS: 14 spot intraoperative fluoroscopic images of the right upper abdominal quadrant during ERCP are provided for review. Initial image demonstrates an ERCP probe overlying the right upper abdominal quadrant Subsequent images demonstrate opacification of the central aspect of the biliary tree which appears markedly dilated with non opacification and apparent malignant occlusion involving the majority of the CBD. Completion images demonstrates placement of an internal biliary stent traversing the CBD with residual nonocclusive narrowing at its mid aspect. IMPRESSION: ERCP with biliary stenting as above. These images were submitted for radiologic interpretation only. Please see the procedural report for the amount of contrast and the fluoroscopy time utilized. Electronically Signed   By: Sandi Mariscal  M.D.   On: 01/27/2021 14:17    Medications: I have reviewed the patient's current medications.  Assessment: Status post ERCP with metal stent placement LFTs trending down, T bili/AST/ALT/ALP of 4.1/114/156/430 today  Plan: Okay to DC home from GI standpoint. Discussed the same with patient's hospitalist Dr. Posey Pronto.  Ronnette Juniper, MD 01/28/2021, 12:25 PM

## 2021-01-28 NOTE — Progress Notes (Signed)
  Echocardiogram 2D Echocardiogram has been performed.  Norma Morales 01/28/2021, 1:47 PM

## 2021-01-28 NOTE — Plan of Care (Signed)
Careplan goals adequate for DC

## 2021-01-29 ENCOUNTER — Encounter: Payer: Self-pay | Admitting: Hematology and Oncology

## 2021-01-30 ENCOUNTER — Encounter (HOSPITAL_COMMUNITY): Payer: Self-pay | Admitting: Gastroenterology

## 2021-01-30 ENCOUNTER — Telehealth: Payer: Self-pay | Admitting: *Deleted

## 2021-01-30 NOTE — Telephone Encounter (Signed)
Received call from pt regarding her recent hospital discharge. Norma Morales states she was discharged from the hospital on Saturday, 01/28/21. She is checking to see when she is due back here @ [place]. Advised that she is to keep her previously scheduled appt on 02/09/21. Pt voiced understanding. She states she feels well at this time and looks forward to finally getting started on her chemo next week.No other questions or concerns

## 2021-01-31 ENCOUNTER — Encounter: Payer: Self-pay | Admitting: Hematology and Oncology

## 2021-01-31 NOTE — Discharge Summary (Addendum)
Physician Discharge Summary   Patient name: Norma Morales  Admit date:     01/26/2021  Discharge date: 01/28/2021   Discharge Physician: Berle Mull   PCP: Jene Every, MD   Recommendations at discharge: follow up with Gastroenterology and Oncology as recommended   Discharge Diagnoses Principal Problem:   Common biliary duct stricture Active Problems:   Transaminitis   Leucocytosis   Cough   SVT (supraventricular tachycardia) (HCC)   Anemia associated with chemotherapy   Adenocarcinoma of colon ([place])   Port-A-Cath in place   Hospital Course   12/1 admitted to the hospital secondary to elevated bilirubin levels. 12/2 GI consulted underwent ERCP with bare-metal stent placement.  Developed SVT.   * Common biliary duct stricture [facility] consulted.  Bilirubin level elevated significantly from normal 1 week ago to 9 with jaundice.  Most likely from extrinsic compression of the CBD seen on the MRI. Patient underwent ERCP.  Uncovered stent was placed.  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 Tolerating diet  Transaminitis Along with hyperbilirubinemia. LFT has been chronically elevated but now mildly worsened. No evidence of cholangitis on exam. Was on IV hydration.  Avoid hepatotoxic medication.    SVT (supraventricular tachycardia) ([place]) After ERCP patient developed SVT episode acute. Placed on telemetry was found to have persistent SVT.  Telemetry also mentions possibility of a flutter. Currently will use as needed Lopressor and scheduled Lopressor 25 mg twice daily. Resume atenolol home regimen.  Normal electrolytes.  Echocardiogram shows preserve EF.  TSH and free T4. Requesting cardiology consult. No further work up recommended.   Cough Etiology unclear. Patient has cough ongoing for 3 months progressively worsening. Will treat symptomatically. Chest x-ray negative for any pneumonia.  Leucocytosis WBC count elevated.  Patient does not have any evidence  of fever or chills.  Unsure of the etiology. Was empirically cover with antibiotics given that her bilirubin has risen further.  No further need for antibiotic.  Anemia associated with chemotherapy H&H relatively stable but on the lower side.  Monitor.  Adenocarcinoma of colon ([place]) Follows up with oncology. Management per Dr. Lorenso Courier.    Procedures performed: ERCP wth stent placement  Echocardiogram    Condition at discharge: good  Exam General: Appear in mild distress, improving jaundice, no Rash; Oral Mucosa Clear, moist. no Abnormal Neck Mass Or lumps, Conjunctiva normal  Cardiovascular: S1 and S2 Present, no Murmur, Respiratory: good respiratory effort, Bilateral Air entry present and CTA, no Crackles, no wheezes Abdomen: Bowel Sound present, Soft and no tenderness Extremities: no Pedal edema Neurology: alert and oriented to time, place, and person affect appropriate. no new focal deficit  Disposition: Home  Discharge time: greater than 30 minutes.  Follow-up Information     Jene Every, MD. Schedule an appointment as soon as possible for a visit in 1 week(s).   Specialty: Family Medicine Contact information: [place] [address] [place] [address] [phone]         Gastroenterology, [facility]. Schedule an appointment as soon as possible for a visit in 3 week(s).   Contact information: [address] [place] [address] [phone]                 Allergies as of 01/28/2021       Reactions   Aspirin Hives        Medication List     STOP taking these medications    ascorbic acid 500 MG tablet Commonly known as:  VITAMIN C   CALCIUM 600 PO   Cholecalciferol 25 MCG (1000 UT) capsule   CoQ10 50 MG Caps   GLUCOSAMINE CHONDR COMPLEX PO   lidocaine-prilocaine cream Commonly known as: EMLA   Omega-3 1000 MG Caps   PROBIOTIC BLEND PO   Resveratrol-Quercetin 100-100 MG Tabs   Vitamin E 268 MG (400 UNIT) Caps    Zinc 50 MG Tabs       TAKE these medications    alendronate 70 MG tablet Commonly known as: FOSAMAX Take 70 mg by mouth every Sunday.   atenolol 25 MG tablet Commonly known as: TENORMIN Take 25 mg by mouth at bedtime.   benzonatate 100 MG capsule Commonly known as: TESSALON Take 1 capsule (100 mg total) by mouth 3 (three) times daily.   ezetimibe 10 MG tablet Commonly known as: ZETIA Take 10 mg by mouth at bedtime.   NON FORMULARY Take 30 mLs by mouth See admin instructions. [employer] Gold Multivitamin Liquid-/Healthy Immune System & Well-Being (includes Vitamins D3, B12, K2 & Over 120 Whole Food Nutrients)- Drink 30 ml's by mouth once a day   ondansetron 8 MG tablet Commonly known as: ZOFRAN Take 1 tablet (8 mg total) by mouth every 8 (eight) hours as needed for nausea or vomiting.   oxyCODONE 5 MG immediate release tablet Commonly known as: Roxicodone Take 1 tablet (5 mg total) by mouth every 8 (eight) hours as needed. What changed: reasons to take this   prochlorperazine 10 MG tablet Commonly known as: COMPAZINE Take 1 tablet (10 mg total) by mouth every 6 (six) hours as needed for nausea or vomiting.   senna 8.6 MG Tabs tablet Commonly known as: SENOKOT Take 1 tablet (8.6 mg total) by mouth daily as needed for mild constipation.        Filed Weights   01/26/21 1642  Weight: 62.7 kg    DG Chest 2 View  Result Date: 01/26/2021 CLINICAL DATA:  Cough for 2 days. EXAM: CHEST - 2 VIEW COMPARISON:  CT chest 01/11/2021. CT abdomen and pelvis 01/18/2021. FINDINGS: Linear nodular densities in the left lung base are unchanged. There is no focal lung consolidation, pleural effusion or pneumothorax. The heart is normal in size. Right chest port catheter tip projects over the mid SVC. No acute fractures are identified. IMPRESSION: 1. Stable nodular and linear densities in the left lung base. 2. No new focal lung infiltrate. Electronically Signed   By: Ronney Asters  M.D.   On: 01/26/2021 22:18   CT Chest W Contrast  Result Date: 01/11/2021 CLINICAL DATA:  Staging for metastatic disease in the abdomen/pelvis EXAM: CT CHEST WITH CONTRAST TECHNIQUE: Multidetector CT imaging of the chest was performed during intravenous contrast administration. CONTRAST:  66mL OMNIPAQUE IOHEXOL 350 MG/ML SOLN COMPARISON:  None. FINDINGS: Cardiovascular: No significant vascular findings. Normal heart size. No pericardial effusion. Mediastinum/Nodes: No enlarged mediastinal, hilar, or axillary lymph nodes. Thyroid gland, trachea, and esophagus demonstrate no significant findings. Lungs/Pleura: Trachea and central bronchi are patent. No endobronchial lesion. Biapical pleural/parenchymal scarring. 2 mm nodular density in the anterior aspect of the right upper lobe (status 7 image 47). 3 mm nodular density in the posterior right lower lobe (series 7 image 80). There is a 6 mm density in the medial aspect of the left lower lobe abutting the pleura (series 7 image 122). Another density on the same image measuring 1.0 x 0.3 cm. Another density in the posterolateral aspect of the left lower lobe (image 115) measuring  approximately 0.8 x 0.9 cm. And other nodular density in the left lower lobe on image 92 measuring approximately 4 mm. Upper Abdomen: There is a ill-defined hypodense lesion in the right hepatic lobe on series 2 image 136, measures at least 5.5 x 4.0 cm. Few other small hypodense lesions in the liver. Musculoskeletal: No chest wall abnormality. No acute or significant osseous findings. IMPRESSION: 1. Multiple small pulmonary densities in the bilateral lower lobes, of indeterminate etiology, this may represent atelectasis or metastatic disease. Short-term follow-up examination in 3-6 months is recommended. 2. Ill-defined hypodense lesion in the liver measuring at least 5.5 x 4.0 cm as well as few small hypodense lesions concerning for malignant process. 3.  No mediastinal lymphadenopathy.  4.  No suspicious osseous lesion. Electronically Signed   By: Keane Police D.O.   On: 01/11/2021 15:01   CT Abdomen Pelvis W Contrast  Result Date: 01/18/2021 CLINICAL DATA:  Concern for abdominal abscess or infection. History of metastatic colon cancer. Status post core biopsy of right hepatic mass. EXAM: CT ABDOMEN AND PELVIS WITH CONTRAST TECHNIQUE: Multidetector CT imaging of the abdomen and pelvis was performed using the standard protocol following bolus administration of intravenous contrast. CONTRAST:  34mL OMNIPAQUE IOHEXOL 350 MG/ML SOLN COMPARISON:  None. FINDINGS: Lower chest: Bibasilar subpleural atelectasis/scarring. A 7 mm nodular density in the left lower lobe most consistent with metastatic disease. No intra-abdominal free air.  Small free fluid in the pelvis. Hepatobiliary: Multiple hepatic hypodense lesions consistent with metastatic disease. There is a 5.3 x 5.1 cm hypoenhancing area in the right lobe of the liver with small pockets of air corresponding to the biopsied lesion. This likely represent an infiltrative mass or metastasis. An infectious process or developing abscess is less likely but not excluded. No drainable fluid collection identified. No calcified gallstone. There is dilatation of the intrahepatic and extrahepatic biliary tree. The common bile duct measures 17 mm in diameter. There is somewhat narrowing of the central CBD which is suboptimally evaluated on this CT but may be related to mass effect and compression by adjacent adenopathy. Further evaluation with MRI/MRCP is recommended. Pancreas: There is a 12 x 10 mm low attenuating nodule in the body of the pancreas (31/2). Several additional low attenuating lesions in the region of the head of the pancreas noted which may represent peripancreatic adenopathy or metastatic disease. No active inflammatory changes of the pancreas. No gland atrophy or dilatation of the main pancreatic duct. Spleen: Normal in size without focal  abnormality. Adrenals/Urinary Tract: The adrenal glands are unremarkable. The kidneys, visualized ureters, and urinary bladder appear unremarkable. Stomach/Bowel: There is sigmoid diverticulosis without active inflammatory changes. There is moderate stool throughout the colon. There is thickened and irregular appearance of the wall of the ascending colon in the region of the hepatic flexure likely representing known colonic mass/malignancy. There is no bowel obstruction. The appendix is normal. Vascular/Lymphatic: Mild aortoiliac atherosclerotic disease. The IVC is unremarkable. No portal venous gas. Retroperitoneal adenopathy. Reproductive: The uterus is grossly unremarkable. Other: Scattered omental implants consistent with metastatic disease. The largest implant in the left lower quadrant measures approximately 17 x 20 mm (65/2). Musculoskeletal: Degenerative changes of the spine. No acute osseous pathology. IMPRESSION: 1. Thickened and irregular appearance of the wall of the ascending colon in the region of the hepatic flexure likely representing known colonic mass/malignancy. 2. Multiple hepatic hypodense lesions consistent with metastatic disease. The largest lesion in the right lobe of the liver has been biopsied. No drainable fluid  collection/abscess. 3. Dilated intrahepatic and extrahepatic biliary tree with findings concerning for narrowing of the central CBD. Further evaluation with MRI/MRCP is recommended. 4. Scattered omental implants consistent with metastatic disease. 5. Left lower lobe pulmonary nodule/metastatic disease. 6. Sigmoid diverticulosis. No bowel obstruction. Normal appendix. 7. Aortic Atherosclerosis (ICD10-I70.0). Electronically Signed   By: Anner Crete M.D.   On: 01/18/2021 21:04   MR SACRUM SI JOINTS W WO CONTRAST  Result Date: 01/10/2021 CLINICAL DATA:  Suspected bone lesion seen on prior CT examination EXAM: MRI SACRUM WITH AND WITHOUT CONTRAST TECHNIQUE: Multiplanar  multi-sequence MR imaging of the sacrum was performed without and with intravenous contrast. COMPARISON:  None. CT examination dated December 29, 2020 FINDINGS: Bones/Joint/Cartilage No fracture or dislocation. Normal alignment. No joint effusion. No marrow signal abnormality. No SI joint widening or erosive changes. No subchondral reactive marrow changes. No SI joint effusion. There is a T1 hypointense, T2 hyperintense nonenhancing structure abutting the left dorsal root of S2 measuring approximately 1.3 x 1.6 x 1.7 cm, most consistent with a Tarlov cyst, a benign process. No focal lumbar spine abnormality. Degenerate disc disease of the L5-S1. Ligaments, Muscles and Tendons Muscles are normal. No muscle atrophy. No muscle edema. Piriformis muscles are normal bilaterally without signal abnormality. Soft tissue No fluid collection or hematoma. No soft tissue mass. Normal neurovascular bundles. Visualized pelvic viscera are unremarkable. IMPRESSION: 1.  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 2.  No suspicious osseous lesion. Electronically Signed   By: Keane Police D.O.   On: 01/10/2021 17:50   MR 3D Recon At Scanner  Result Date: 01/19/2021 CLINICAL DATA:  Right upper quadrant abdominal pain, nondiagnostic ultrasound. EXAM: MRI ABDOMEN WITHOUT AND WITH CONTRAST (INCLUDING MRCP) TECHNIQUE: Multiplanar multisequence MR imaging of the abdomen was performed both before and after the administration of intravenous contrast. Heavily T2-weighted images of the biliary and pancreatic ducts were obtained, and three-dimensional MRCP images were rendered by post processing. CONTRAST:  63mL GADAVIST GADOBUTROL 1 MMOL/ML IV SOLN COMPARISON:  01/18/2021. FINDINGS: Lower chest: Atelectasis or infiltrate is noted at the lung bases. There is redemonstration of a nodular lesion in the left lower lobe measuring 8 mm. Trace bilateral pleural effusions are present. Hepatobiliary: There is a heterogeneous  ill-defined masslike lesion with rim enhancement in the posterior right lobe of the liver measuring 5.6 x 5.6 cm. Additional smaller T2 hyperintensities are present in the right lobe of the liver with rim enhancement. Intrahepatic and extrahepatic biliary ductal dilatation is noted. The proximal common bile duct is distended measuring 1.7 cm in diameter. There is segmental narrowing with smooth shouldering of the mid common bile duct, which may be related to extrinsic compression from lymphadenopathy. No enhancing mass is identified in this region. The distal common bile duct is normal in caliber. No choledocholithiasis. The gallbladder is contracted and contains T1 hyperintense material, possible sludge. Pancreas: The pancreas enhances normally. The previously described low-attenuation nodule in the body of the pancreas is not well seen on this exam. No significant pancreatic ductal dilatation. Spleen:  Within normal limits in size and appearance. Adrenals/Urinary Tract: The adrenal glands are within normal limits. No evidence of hydronephrosis. A few subcentimeter T2 hyperintensities are seen in the kidneys which are too small to further characterize and may represent cysts. Stomach/Bowel: No bowel obstruction. There is focal colonic wall narrowing with wall thickening and enhancement involving the hepatic flexure, which may represent patient's known mass. A moderate amount of retained stool is present  in the colon. Vascular/Lymphatic: Multiple prominent lymph nodes are seen in the gastrohepatic ligament, porta hepatis, and retroperitoneum. There are nodules with restricted diffusion in the anterior peritoneum, concerning for metastatic disease. The aorta is normal in caliber. The splenic vein, portal vein, and superior mesenteric vein are patent. Other:  No free fluid. Musculoskeletal: No suspicious bone lesions identified. IMPRESSION: 1. Multiple rim enhancing lesions in the liver, the largest measuring 5.6 x  5.6 cm, suggesting metastatic disease. Given history of recent biopsy, correlation with biopsy results is recommended. 2. Intrahepatic and extrahepatic biliary ductal dilatation. There is smooth shouldering of the mid common bile duct with segmental narrowing which may be due to extrinsic mass effect from local lymphadenopathy. No definite infiltrative mass is seen. 3. Enhancing mass at the colonic hepatic flexure, may represent patient's known colon cancer. 4. Multiple enlarged lymph nodes in the porta hepatis, gastrohepatic ligament, and retroperitoneum. There also nodules with restricted diffusion in the anterior peritoneum, concerning for peritoneal metastasis. 5. Left lower lobe pulmonary nodule, possible metastatic disease. Electronically Signed   By: Brett Fairy M.D.   On: 01/19/2021 04:22   IR US Guide Bx Asp/Drain  Result Date: 01/17/2021 INDICATION: HEPATIC METASTATIC DISEASE. EXAM: ULTRASOUND CORE BIOPSY RIGHT HEPATIC MASS MEDICATIONS: 1% LIDOCAINE ANESTHESIA/SEDATION: Moderate (conscious) sedation was employed during this procedure. A total of Versed 2.0 mg and Fentanyl 100 mcg was administered intravenously by the radiology nurse. Total intra-service moderate Sedation Time: 10 minutes. The patient's level of consciousness and vital signs were monitored continuously by radiology nursing throughout the procedure under my direct supervision. FLUOROSCOPY TIME:  Fluoroscopy Time: None. COMPLICATIONS: None immediate. PROCEDURE: Informed written consent was obtained from the patient after a thorough discussion of the procedural risks, benefits and alternatives. All questions were addressed. Maximal Sterile Barrier Technique was utilized including caps, mask, sterile gowns, sterile gloves, sterile drape, hand hygiene and skin antiseptic. A timeout was performed prior to the initiation of the procedure. Previous imaging reviewed. Preliminary ultrasound performed. Right hepatic lesion was localized and  marked for a mid axillary line approach through a lower intercostal space. Under sterile conditions and local anesthesia, a 17 gauge 11.8 cm guide was advanced to the lesion. Needle position confirmed with ultrasound. Images obtained for documentation. 18 gauge core biopsies obtained under direct ultrasound. Samples were intact and non fragmented. These were placed in formalin. Needle tract occluded with Gel-Foam. Postprocedure imaging demonstrates no hemorrhage or hematoma. Patient tolerated biopsy well. IMPRESSION: Successful ultrasound right hepatic mass 18 gauge core biopsy Electronically Signed   By: Jerilynn Mages.  Shick M.D.   On: 01/17/2021 14:56   DG ERCP WITH SPHINCTEROTOMY  Result Date: 01/27/2021 CLINICAL DATA:  ERCP with sphincterotomy and stent placement. EXAM: ERCP TECHNIQUE: Multiple spot images obtained with the fluoroscopic device and submitted for interpretation post-procedure. FLUOROSCOPY TIME:  4 minutes, 51 seconds COMPARISON:  MRCP-01/19/2021 FINDINGS: 14 spot intraoperative fluoroscopic images of the right upper abdominal quadrant during ERCP are provided for review. Initial image demonstrates an ERCP probe overlying the right upper abdominal quadrant Subsequent images demonstrate opacification of the central aspect of the biliary tree which appears markedly dilated with non opacification and apparent malignant occlusion involving the majority of the CBD. Completion images demonstrates placement of an internal biliary stent traversing the CBD with residual nonocclusive narrowing at its mid aspect. IMPRESSION: ERCP with biliary stenting as above. These images were submitted for radiologic interpretation only. Please see the procedural report for the amount of contrast and the fluoroscopy time utilized. Electronically  Signed   By: Sandi Mariscal M.D.   On: 01/27/2021 14:17   MR ABDOMEN MRCP W WO CONTAST  Result Date: 01/19/2021 CLINICAL DATA:  Right upper quadrant abdominal pain, nondiagnostic  ultrasound. EXAM: MRI ABDOMEN WITHOUT AND WITH CONTRAST (INCLUDING MRCP) TECHNIQUE: Multiplanar multisequence MR imaging of the abdomen was performed both before and after the administration of intravenous contrast. Heavily T2-weighted images of the biliary and pancreatic ducts were obtained, and three-dimensional MRCP images were rendered by post processing. CONTRAST:  58mL GADAVIST GADOBUTROL 1 MMOL/ML IV SOLN COMPARISON:  01/18/2021. FINDINGS: Lower chest: Atelectasis or infiltrate is noted at the lung bases. There is redemonstration of a nodular lesion in the left lower lobe measuring 8 mm. Trace bilateral pleural effusions are present. Hepatobiliary: There is a heterogeneous ill-defined masslike lesion with rim enhancement in the posterior right lobe of the liver measuring 5.6 x 5.6 cm. Additional smaller T2 hyperintensities are present in the right lobe of the liver with rim enhancement. Intrahepatic and extrahepatic biliary ductal dilatation is noted. The proximal common bile duct is distended measuring 1.7 cm in diameter. There is segmental narrowing with smooth shouldering of the mid common bile duct, which may be related to extrinsic compression from lymphadenopathy. No enhancing mass is identified in this region. The distal common bile duct is normal in caliber. No choledocholithiasis. The gallbladder is contracted and contains T1 hyperintense material, possible sludge. Pancreas: The pancreas enhances normally. The previously described low-attenuation nodule in the body of the pancreas is not well seen on this exam. No significant pancreatic ductal dilatation. Spleen:  Within normal limits in size and appearance. Adrenals/Urinary Tract: The adrenal glands are within normal limits. No evidence of hydronephrosis. A few subcentimeter T2 hyperintensities are seen in the kidneys which are too small to further characterize and may represent cysts. Stomach/Bowel: No bowel obstruction. There is focal colonic wall  narrowing with wall thickening and enhancement involving the hepatic flexure, which may represent patient's known mass. A moderate amount of retained stool is present in the colon. Vascular/Lymphatic: Multiple prominent lymph nodes are seen in the gastrohepatic ligament, porta hepatis, and retroperitoneum. There are nodules with restricted diffusion in the anterior peritoneum, concerning for metastatic disease. The aorta is normal in caliber. The splenic vein, portal vein, and superior mesenteric vein are patent. Other:  No free fluid. Musculoskeletal: No suspicious bone lesions identified. IMPRESSION: 1. Multiple rim enhancing lesions in the liver, the largest measuring 5.6 x 5.6 cm, suggesting metastatic disease. Given history of recent biopsy, correlation with biopsy results is recommended. 2. Intrahepatic and extrahepatic biliary ductal dilatation. There is smooth shouldering of the mid common bile duct with segmental narrowing which may be due to extrinsic mass effect from local lymphadenopathy. No definite infiltrative mass is seen. 3. Enhancing mass at the colonic hepatic flexure, may represent patient's known colon cancer. 4. Multiple enlarged lymph nodes in the porta hepatis, gastrohepatic ligament, and retroperitoneum. There also nodules with restricted diffusion in the anterior peritoneum, concerning for peritoneal metastasis. 5. Left lower lobe pulmonary nodule, possible metastatic disease. Electronically Signed   By: Brett Fairy M.D.   On: 01/19/2021 04:22   ECHOCARDIOGRAM COMPLETE  Result Date: 01/28/2021    ECHOCARDIOGRAM REPORT   Patient Name:   GLENETTE BOOKWALTER Date of Exam: 01/28/2021 Medical Rec #:  [id number]    Height:       63.0 in Accession #:    [id number]   Weight:       138.2 lb Date of Birth:  1945-05-09    BSA:          1.653 m Patient Age:    75 years     BP:           135/73 mmHg Patient Gender: F            HR:           65 bpm. Exam Location:  Inpatient Procedure: 2D Echo, Cardiac  Doppler and Color Doppler Indications:    Tachycardia [[id number]]  History:        Patient has no prior history of Echocardiogram examinations.                 Risk Factors:Dyslipidemia.  Sonographer:    Bernadene Person RDCS Referring Phys: [id number] [place]  1. Left ventricular ejection fraction, by estimation, is 65 to 70%. The left ventricle has normal function. The left ventricle has no regional wall motion abnormalities. Left ventricular diastolic parameters were normal.  2. Right ventricular systolic function is normal. The right ventricular size is normal. There is normal pulmonary artery systolic pressure.  3. Left atrial size was mildly dilated.  4. Right atrial size was mildly dilated.  5. The mitral valve is normal in structure. Trivial mitral valve regurgitation. No evidence of mitral stenosis.  6. The aortic valve is normal in structure. Aortic valve regurgitation is not visualized. No aortic stenosis is present. FINDINGS  Left Ventricle: Left ventricular ejection fraction, by estimation, is 65 to 70%. The left ventricle has normal function. The left ventricle has no regional wall motion abnormalities. The left ventricular internal cavity size was normal in size. There is  no left ventricular hypertrophy. Left ventricular diastolic parameters were normal. Right Ventricle: The right ventricular size is normal. Right vetricular wall thickness was not well visualized. Right ventricular systolic function is normal. There is normal pulmonary artery systolic pressure. The tricuspid regurgitant velocity is 2.33 m/s, and with an assumed right atrial pressure of 3 mmHg, the estimated right ventricular systolic pressure is 32.1 mmHg. Left Atrium: Left atrial size was mildly dilated. Right Atrium: Right atrial size was mildly dilated. Pericardium: There is no evidence of pericardial effusion. Mitral Valve: The mitral valve is normal in structure. Trivial mitral valve regurgitation. No evidence of  mitral valve stenosis. Tricuspid Valve: The tricuspid valve is grossly normal. Tricuspid valve regurgitation is not demonstrated. Aortic Valve: The aortic valve is normal in structure. Aortic valve regurgitation is not visualized. No aortic stenosis is present. Pulmonic Valve: The pulmonic valve was grossly normal. Pulmonic valve regurgitation is trivial. Aorta: The aortic root and ascending aorta are structurally normal, with no evidence of dilitation. IAS/Shunts: The interatrial septum was not well visualized.  LEFT VENTRICLE PLAX 2D LVIDd:         4.10 cm   Diastology LVIDs:         2.30 cm   LV e' medial:    10.10 cm/s LV PW:         0.80 cm   LV E/e' medial:  8.6 LV IVS:        0.80 cm   LV e' lateral:   9.68 cm/s LVOT diam:     1.90 cm   LV E/e' lateral: 9.0 LV SV:         59 LV SV Index:   36 LVOT Area:     2.84 cm  RIGHT VENTRICLE RV S prime:     9.60 cm/s TAPSE (M-mode): 1.7 cm LEFT  ATRIUM             Index        RIGHT ATRIUM           Index LA diam:        3.40 cm 2.06 cm/m   RA Area:     18.50 cm LA Vol (A2C):   67.9 ml 41.08 ml/m  RA Volume:   52.30 ml  31.65 ml/m LA Vol (A4C):   52.4 ml 31.71 ml/m LA Biplane Vol: 63.2 ml 38.24 ml/m  AORTIC VALVE             PULMONIC VALVE LVOT Vmax:   94.60 cm/s  PR End Diast Vel: 4.33 msec LVOT Vmean:  61.100 cm/s LVOT VTI:    0.208 m  AORTA Ao Root diam: 3.30 cm Ao Asc diam:  3.10 cm MITRAL VALVE               TRICUSPID VALVE MV Area (PHT): 4.39 cm    TR Peak grad:   21.7 mmHg MV Decel Time: 173 msec    TR Vmax:        233.00 cm/s MV E velocity: 87.30 cm/s MV A velocity: 53.80 cm/s  SHUNTS MV E/A ratio:  1.62        Systemic VTI:  0.21 m                            Systemic Diam: 1.90 cm Mertie Moores MD Electronically signed by Mertie Moores MD Signature Date/Time: 01/28/2021/3:09:20 PM    Final    IR IMAGING GUIDED PORT INSERTION  Result Date: 01/17/2021 CLINICAL DATA:  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 Daryll Brod, MD Guidance:  Ultrasound and fluoroscopic MEDICATIONS: 1% lidocaine local with epinephrine ANESTHESIA/SEDATION: Versed 2.0 mg IV; Fentanyl 100 mcg IV; Moderate Sedation Time:  23 minute The patient was continuously monitored during the procedure by the interventional radiology nurse under my direct supervision. FLUOROSCOPY TIME:  0 minutes, 36 seconds (1 mGy) COMPLICATIONS: None immediate. CONTRAST:  None. PROCEDURE: Informed consent was obtained from the patient following explanation of the procedure, risks, benefits and alternatives. The patient understands, agrees and consents for the procedure. All questions were addressed. A time out was performed. Maximal barrier sterile technique utilized including caps, mask, sterile gowns, sterile gloves, large sterile drape, hand hygiene, and 2% chlorhexidine scrub. Under sterile conditions and local anesthesia, right internal jugular micropuncture venous access was performed. Access was performed with ultrasound. Images were obtained for documentation of the patent right internal jugular vein. A guide wire was inserted followed by a transitional dilator. This allowed insertion of a guide wire and catheter into the IVC. Measurements were obtained from the SVC / RA junction back to the right IJ venotomy site. In the right infraclavicular chest, a subcutaneous pocket was created over the second anterior rib. This was done under sterile conditions and local anesthesia. 1% lidocaine with epinephrine was utilized for this. A 2.5 cm incision was made in the skin. Blunt dissection was performed to create a subcutaneous pocket over the right pectoralis major muscle. The pocket was flushed with saline vigorously. There was adequate hemostasis. The port catheter was assembled and checked for leakage. The port catheter was secured in the pocket with two  retention sutures. The tubing was tunneled subcutaneously  to the right venotomy site and inserted into the SVC/RA junction through a valved peel-away sheath. Position was confirmed with fluoroscopy. Images were obtained for documentation. The patient tolerated the procedure well. No immediate complications. Incisions were closed in a two layer fashion with 4 - 0 Vicryl suture. Dermabond was applied to the skin. The port catheter was accessed, blood was aspirated followed by saline and heparin flushes. Needle was removed. A dry sterile dressing was applied. IMPRESSION: Ultrasound and fluoroscopically guided right internal jugular single lumen power port catheter insertion. Tip in the SVC/RA junction. Catheter ready for use. Electronically Signed   By: Jerilynn Mages.  Shick M.D.   On: 01/17/2021 14:53   Results for orders placed or performed during the hospital encounter of 01/18/21  Resp Panel by RT-PCR (Flu A&B, Covid) Nasopharyngeal Swab     Status: None   Collection Time: 01/18/21  4:53 PM   Specimen: Nasopharyngeal Swab; Nasopharyngeal(NP) swabs in vial transport medium  Result Value Ref Range Status   SARS Coronavirus 2 by RT PCR NEGATIVE NEGATIVE Final    Comment: (NOTE) SARS-CoV-2 target nucleic acids are NOT DETECTED.  The SARS-CoV-2 RNA is generally detectable in upper respiratory specimens during the acute phase of infection. The lowest concentration of SARS-CoV-2 viral copies this assay can detect is 138 copies/mL. A negative result does not preclude SARS-Cov-2 infection and should not be used as the sole basis for treatment or other patient management decisions. A negative result may occur with  improper specimen collection/handling, submission of specimen other than nasopharyngeal swab, presence of viral mutation(s) within the areas targeted by this assay, and inadequate number of viral copies(<138 copies/mL). A negative result must be combined with clinical observations, patient history, and  epidemiological information. The expected result is Negative.  Fact Sheet for Patients:  EntrepreneurPulse.com.au  Fact Sheet for Healthcare Providers:  IncredibleEmployment.be  This test is no t yet approved or cleared by the [place] FDA and  has been authorized for detection and/or diagnosis of SARS-CoV-2 by FDA under an Emergency Use Authorization (EUA). This EUA will remain  in effect (meaning this test can be used) for the duration of the COVID-19 declaration under Section 564(b)(1) of the Act, 21 U.S.C.section 360bbb-3(b)(1), unless the authorization is terminated  or revoked sooner.       Influenza A by PCR NEGATIVE NEGATIVE Final   Influenza B by PCR NEGATIVE NEGATIVE Final    Comment: (NOTE) The Xpert Xpress SARS-CoV-2/FLU/RSV plus assay is intended as an aid in the diagnosis of influenza from Nasopharyngeal swab specimens and should not be used as a sole basis for treatment. Nasal washings and aspirates are unacceptable for Xpert Xpress SARS-CoV-2/FLU/RSV testing.  Fact Sheet for Patients: EntrepreneurPulse.com.au  Fact Sheet for Healthcare Providers: IncredibleEmployment.be  This test is not yet approved or cleared by the [place] FDA and has been authorized for detection and/or diagnosis of SARS-CoV-2 by FDA under an Emergency Use Authorization (EUA). This EUA will remain in effect (meaning this test can be used) for the duration of the COVID-19 declaration under Section 564(b)(1) of the Act, 21 U.S.C. section 360bbb-3(b)(1), unless the authorization is terminated or revoked.  Performed at [facility], [place] [address], [place], [place] [address]   Culture, blood (routine x 2)     Status: None   Collection Time: 01/18/21  5:37 PM   Specimen: Left Antecubital; Blood  Result Value Ref Range Status   Specimen Description   Final    LEFT ANTECUBITAL Performed  at [facility], [place] [address] [place], [place] [address]    Special Requests   Final    BOTTLES DRAWN AEROBIC AND ANAEROBIC Blood Culture adequate volume Performed at [place] [address] [place], [place] [address]    Culture   Final    NO GROWTH 5 DAYS Performed at [facility], [place] [address] [place], [place] [address]    Report Status 01/23/2021 FINAL  Final  Culture, blood (routine x 2)     Status: None   Collection Time: 01/18/21  6:09 PM   Specimen: Left Antecubital; Blood  Result Value Ref Range Status   Specimen Description   Final    LEFT ANTECUBITAL Performed at [place] [address] [place], [place] [address]    Special Requests   Final    BOTTLES DRAWN AEROBIC AND ANAEROBIC Blood Culture adequate volume Performed at [place] [address], [place], [place] [address]    Culture   Final    NO GROWTH 5 DAYS Performed at [facility], [place] [address] [place], [place] [address]    Report Status 01/23/2021 FINAL  Final   Recent Labs  Lab 01/26/21 1204 01/27/21 0509 01/28/21 0444  WBC 14.7* 11.1* 12.0*  NEUTROABS 12.5*  --  10.0*  HGB 9.8* 8.8* 9.0*  HCT 29.4* 26.7* 28.5*  MCV 78.6* 80.2 81.7  PLT 505* 453* 470*   Recent Labs  Lab 01/26/21 1204 01/27/21 0509 01/27/21 1834 01/28/21 0444  NA 135 131* 132* 134*  K 3.7 3.5 4.0 3.9  CL 98 99 99 101  CO2 25 26 24 27   GLUCOSE 133* 92 132* 120*  BUN 9 8 8 10   CREATININE 0.65 0.39* 0.46 0.50  CALCIUM 9.0 7.9* 8.1* 8.1*  MG  --   --  2.2 2.2   Recent Labs  Lab 01/26/21 1204 01/27/21 0509 01/28/21 0444  AST 252* 187* 114*  ALT 282* 195* 156*  ALKPHOS 666* 480* 430*  BILITOT 9.0* 7.4* 4.1*  PROT 6.8 5.7* 5.8*  ALBUMIN 2.7* 2.4* 2.4*   No results for input(s): GLUCAP in the last 168 hours.  Author: [facility]

## 2021-02-01 ENCOUNTER — Encounter (HOSPITAL_COMMUNITY): Payer: Self-pay | Admitting: Hematology and Oncology

## 2021-02-02 ENCOUNTER — Encounter: Payer: Self-pay | Admitting: Hematology and Oncology

## 2021-02-02 ENCOUNTER — Other Ambulatory Visit: Payer: Self-pay | Admitting: Hematology and Oncology

## 2021-02-07 ENCOUNTER — Telehealth: Payer: Self-pay

## 2021-02-07 NOTE — Telephone Encounter (Signed)
T/C from pt stating she been taking senokot daily but it has not been helping her constipation.  She took the max yesterday (2 AM and 2 PM) with no relief.  Pt was advised to increase fluids and fiber, walk, etc.  She said she has no appetite, N/V, and no fever.  Abd pain is minimal and she is passing gas.  She said before she had taken three senokot at one time and it was uncomfortable but it worked. She wants to know if this is still something she can do? Please advise

## 2021-02-07 NOTE — Telephone Encounter (Signed)
Pt advised OK to take three senokot and add miralax as needed for constipation

## 2021-02-08 LAB — SURGICAL PATHOLOGY

## 2021-02-09 ENCOUNTER — Inpatient Hospital Stay: Payer: Medicare Other

## 2021-02-09 ENCOUNTER — Inpatient Hospital Stay (HOSPITAL_BASED_OUTPATIENT_CLINIC_OR_DEPARTMENT_OTHER): Payer: Medicare Other | Admitting: Hematology and Oncology

## 2021-02-09 ENCOUNTER — Other Ambulatory Visit: Payer: Self-pay | Admitting: Hematology and Oncology

## 2021-02-09 ENCOUNTER — Other Ambulatory Visit: Payer: Self-pay

## 2021-02-09 VITALS — BP 111/60 | HR 81 | Temp 98.7°F | Resp 17 | Wt 140.3 lb

## 2021-02-09 DIAGNOSIS — Z5111 Encounter for antineoplastic chemotherapy: Secondary | ICD-10-CM | POA: Diagnosis present

## 2021-02-09 DIAGNOSIS — C189 Malignant neoplasm of colon, unspecified: Secondary | ICD-10-CM | POA: Diagnosis present

## 2021-02-09 DIAGNOSIS — R160 Hepatomegaly, not elsewhere classified: Secondary | ICD-10-CM

## 2021-02-09 DIAGNOSIS — C787 Secondary malignant neoplasm of liver and intrahepatic bile duct: Secondary | ICD-10-CM

## 2021-02-09 DIAGNOSIS — C786 Secondary malignant neoplasm of retroperitoneum and peritoneum: Secondary | ICD-10-CM

## 2021-02-09 DIAGNOSIS — Z5112 Encounter for antineoplastic immunotherapy: Secondary | ICD-10-CM | POA: Diagnosis not present

## 2021-02-09 DIAGNOSIS — Z79899 Other long term (current) drug therapy: Secondary | ICD-10-CM | POA: Diagnosis not present

## 2021-02-09 DIAGNOSIS — Z95828 Presence of other vascular implants and grafts: Secondary | ICD-10-CM

## 2021-02-09 DIAGNOSIS — R17 Unspecified jaundice: Secondary | ICD-10-CM

## 2021-02-09 DIAGNOSIS — R1011 Right upper quadrant pain: Secondary | ICD-10-CM | POA: Diagnosis not present

## 2021-02-09 DIAGNOSIS — K5900 Constipation, unspecified: Secondary | ICD-10-CM | POA: Diagnosis not present

## 2021-02-09 DIAGNOSIS — C799 Secondary malignant neoplasm of unspecified site: Secondary | ICD-10-CM

## 2021-02-09 LAB — CBC WITH DIFFERENTIAL (CANCER CENTER ONLY)
Abs Immature Granulocytes: 0.08 10*3/uL — ABNORMAL HIGH (ref 0.00–0.07)
Basophils Absolute: 0.1 10*3/uL (ref 0.0–0.1)
Basophils Relative: 0 %
Eosinophils Absolute: 0 10*3/uL (ref 0.0–0.5)
Eosinophils Relative: 0 %
HCT: 29.4 % — ABNORMAL LOW (ref 36.0–46.0)
Hemoglobin: 9.6 g/dL — ABNORMAL LOW (ref 12.0–15.0)
Immature Granulocytes: 1 %
Lymphocytes Relative: 3 %
Lymphs Abs: 0.4 10*3/uL — ABNORMAL LOW (ref 0.7–4.0)
MCH: 25.8 pg — ABNORMAL LOW (ref 26.0–34.0)
MCHC: 32.7 g/dL (ref 30.0–36.0)
MCV: 79 fL — ABNORMAL LOW (ref 80.0–100.0)
Monocytes Absolute: 1.3 10*3/uL — ABNORMAL HIGH (ref 0.1–1.0)
Monocytes Relative: 9 %
Neutro Abs: 12.2 10*3/uL — ABNORMAL HIGH (ref 1.7–7.7)
Neutrophils Relative %: 87 %
Platelet Count: 467 10*3/uL — ABNORMAL HIGH (ref 150–400)
RBC: 3.72 MIL/uL — ABNORMAL LOW (ref 3.87–5.11)
RDW: 15.3 % (ref 11.5–15.5)
WBC Count: 14.1 10*3/uL — ABNORMAL HIGH (ref 4.0–10.5)
nRBC: 0 % (ref 0.0–0.2)

## 2021-02-09 LAB — CMP (CANCER CENTER ONLY)
ALT: 49 U/L — ABNORMAL HIGH (ref 0–44)
AST: 68 U/L — ABNORMAL HIGH (ref 15–41)
Albumin: 2.6 g/dL — ABNORMAL LOW (ref 3.5–5.0)
Alkaline Phosphatase: 420 U/L — ABNORMAL HIGH (ref 38–126)
Anion gap: 8 (ref 5–15)
BUN: 12 mg/dL (ref 8–23)
CO2: 25 mmol/L (ref 22–32)
Calcium: 8.6 mg/dL — ABNORMAL LOW (ref 8.9–10.3)
Chloride: 98 mmol/L (ref 98–111)
Creatinine: 0.61 mg/dL (ref 0.44–1.00)
GFR, Estimated: >60 mL/min (ref >60)
Glucose, Bld: 127 mg/dL — ABNORMAL HIGH (ref 70–99)
Potassium: 4 mmol/L (ref 3.5–5.1)
Sodium: 131 mmol/L — ABNORMAL LOW (ref 135–145)
Total Bilirubin: 1.7 mg/dL — ABNORMAL HIGH (ref 0.3–1.2)
Total Protein: 6.7 g/dL (ref 6.5–8.1)

## 2021-02-09 LAB — PROTEIN / CREATININE RATIO, URINE
Creatinine, Urine: 144.67 mg/dL
Protein Creatinine Ratio: 0.15 mg/mg{Cre} (ref 0.00–0.15)
Total Protein, Urine: 22 mg/dL

## 2021-02-09 MED ORDER — DEXTROSE 5 % IV SOLN
Freq: Once | INTRAVENOUS | Status: AC
Start: 2021-02-09 — End: 2021-02-09

## 2021-02-09 MED ORDER — OXALIPLATIN CHEMO INJECTION 100 MG/20ML
85.0000 mg/m2 | Freq: Once | INTRAVENOUS | Status: AC
  Administered 2021-02-09: 145 mg via INTRAVENOUS
  Filled 2021-02-09: qty 20

## 2021-02-09 MED ORDER — FLUOROURACIL CHEMO INJECTION 2.5 GM/50ML
400.0000 mg/m2 | Freq: Once | INTRAVENOUS | Status: AC
  Administered 2021-02-09: 700 mg via INTRAVENOUS
  Filled 2021-02-09: qty 14

## 2021-02-09 MED ORDER — LEUCOVORIN CALCIUM INJECTION 350 MG
400.0000 mg/m2 | Freq: Once | INTRAVENOUS | Status: AC
  Administered 2021-02-09: 680 mg via INTRAVENOUS
  Filled 2021-02-09: qty 34

## 2021-02-09 MED ORDER — SODIUM CHLORIDE 0.9 % IV SOLN
4.8000 mg/kg | Freq: Once | INTRAVENOUS | Status: AC
  Administered 2021-02-09: 300 mg via INTRAVENOUS
  Filled 2021-02-09: qty 12

## 2021-02-09 MED ORDER — PALONOSETRON HCL INJECTION 0.25 MG/5ML
0.2500 mg | Freq: Once | INTRAVENOUS | Status: AC
  Administered 2021-02-09: 0.25 mg via INTRAVENOUS
  Filled 2021-02-09: qty 5

## 2021-02-09 MED ORDER — SODIUM CHLORIDE 0.9 % IV SOLN: Freq: Once | INTRAVENOUS | Status: AC

## 2021-02-09 MED ORDER — SODIUM CHLORIDE 0.9 % IV SOLN
2400.0000 mg/m2 | INTRAVENOUS | Status: DC
  Administered 2021-02-09: 4100 mg via INTRAVENOUS
  Filled 2021-02-09: qty 82

## 2021-02-09 MED ORDER — SODIUM CHLORIDE 0.9 % IV SOLN
10.0000 mg | Freq: Once | INTRAVENOUS | Status: AC
  Administered 2021-02-09: 10 mg via INTRAVENOUS
  Filled 2021-02-09: qty 10

## 2021-02-09 MED ORDER — SODIUM CHLORIDE 0.9% FLUSH
10.0000 mL | Freq: Once | INTRAVENOUS | Status: AC
Start: 2021-02-09 — End: 2021-02-09
  Administered 2021-02-09: 10 mL

## 2021-02-09 NOTE — Progress Notes (Signed)
OK to treat with TBili of 1.7 per Dr. Lorenso Courier

## 2021-02-09 NOTE — Patient Instructions (Addendum)
[facility]  Discharge Instructions: Thank you for choosing [facility] to provide your oncology and hematology care.   If you have a lab appointment with the [facility], please go directly to the [facility] and check in at the registration area.   Wear comfortable clothing and clothing appropriate for easy access to any Portacath or PICC line.   We strive to give you quality time with your provider. You may need to reschedule your appointment if you arrive late (15 or more minutes).  Arriving late affects you and other patients whose appointments are after yours.  Also, if you miss three or more appointments without notifying the office, you may be dismissed from the clinic at the providers discretion.      For prescription refill requests, have your pharmacy contact our office and allow 72 hours for refills to be completed.    Today you received the following chemotherapy and/or immunotherapy agents: Oxaliplatin/Leucovorin/Adrucil.      To help prevent nausea and vomiting after your treatment, we encourage you to take your nausea medication as directed.  BELOW ARE SYMPTOMS THAT SHOULD BE REPORTED IMMEDIATELY: *FEVER GREATER THAN 100.4 F (38 C) OR HIGHER *CHILLS OR SWEATING *NAUSEA AND VOMITING THAT IS NOT CONTROLLED WITH YOUR NAUSEA MEDICATION *UNUSUAL SHORTNESS OF BREATH *UNUSUAL BRUISING OR BLEEDING *URINARY PROBLEMS (pain or burning when urinating, or frequent urination) *BOWEL PROBLEMS (unusual diarrhea, constipation, pain near the anus) TENDERNESS IN MOUTH AND THROAT WITH OR WITHOUT PRESENCE OF ULCERS (sore throat, sores in mouth, or a toothache) UNUSUAL RASH, SWELLING OR PAIN  UNUSUAL VAGINAL DISCHARGE OR ITCHING   Items with * indicate a potential emergency and should be followed up as soon as possible or go to the Emergency Department if any problems should occur.  Please show the CHEMOTHERAPY ALERT CARD or IMMUNOTHERAPY  ALERT CARD at check-in to the Emergency Department and triage nurse.  Should you have questions after your visit or need to cancel or reschedule your appointment, please contact [facility]  Dept: [phone]  and follow the prompts.  Office hours are 8:00 a.m. to 4:30 p.m. Monday - Friday. Please note that voicemails left after 4:00 p.m. may not be returned until the following business day.  We are closed weekends and major holidays. You have access to a nurse at all times for urgent questions. Please call the main number to the clinic Dept: [phone] and follow the prompts.   For any non-urgent questions, you may also contact your provider using MyChart. We now offer e-Visits for anyone 80 and older to request care online for non-urgent symptoms. For details visit [email or web address].   Also download the MyChart app! Go to the app store, search "MyChart", open the app, select [facility], and log in with your MyChart username and password.  Due to Covid, a mask is required upon entering the hospital/clinic. If you do not have a mask, one will be given to you upon arrival. For doctor visits, patients may have 1 support person aged 40 or older with them. For treatment visits, patients cannot have anyone with them due to current Covid guidelines and our immunocompromised population.   Oxaliplatin Injection What is this medication? OXALIPLATIN (ox AL i PLA tin) is a chemotherapy drug. It targets fast dividing cells, like cancer cells, and causes these cells to die. This medicine is used to treat cancers of the colon and rectum, and many other cancers. This medicine may  be used for other purposes; ask your health care provider or pharmacist if you have questions. COMMON BRAND NAME(S): Eloxatin What should I tell my care team before I take this medication? They need to know if you have any of these conditions: heart disease history of irregular heartbeat liver  disease low blood counts, like white cells, platelets, or red blood cells lung or breathing disease, like asthma take medicines that treat or prevent blood clots tingling of the fingers or toes, or other nerve disorder an unusual or allergic reaction to oxaliplatin, other chemotherapy, other medicines, foods, dyes, or preservatives pregnant or trying to get pregnant breast-feeding How should I use this medication? This drug is given as an infusion into a vein. It is administered in a hospital or clinic by a specially trained health care professional. Talk to your pediatrician regarding the use of this medicine in children. Special care may be needed. Overdosage: If you think you have taken too much of this medicine contact a poison control center or emergency room at once. NOTE: This medicine is only for you. Do not share this medicine with others. What if I miss a dose? It is important not to miss a dose. Call your doctor or health care professional if you are unable to keep an appointment. What may interact with this medication? Do not take this medicine with any of the following medications: cisapride dronedarone pimozide thioridazine This medicine may also interact with the following medications: aspirin and aspirin-like medicines certain medicines that treat or prevent blood clots like warfarin, apixaban, dabigatran, and rivaroxaban cisplatin cyclosporine diuretics medicines for infection like acyclovir, adefovir, amphotericin B, bacitracin, cidofovir, foscarnet, ganciclovir, gentamicin, pentamidine, vancomycin NSAIDs, medicines for pain and inflammation, like ibuprofen or naproxen other medicines that prolong the QT interval (an abnormal heart rhythm) pamidronate zoledronic acid This list may not describe all possible interactions. Give your health care provider a list of all the medicines, herbs, non-prescription drugs, or dietary supplements you use. Also tell them if you  smoke, drink alcohol, or use illegal drugs. Some items may interact with your medicine. What should I watch for while using this medication? Your condition will be monitored carefully while you are receiving this medicine. You may need blood work done while you are taking this medicine. This medicine may make you feel generally unwell. This is not uncommon as chemotherapy can affect healthy cells as well as cancer cells. Report any side effects. Continue your course of treatment even though you feel ill unless your healthcare professional tells you to stop. This medicine can make you more sensitive to cold. Do not drink cold drinks or use ice. Cover exposed skin before coming in contact with cold temperatures or cold objects. When out in cold weather wear warm clothing and cover your mouth and nose to warm the air that goes into your lungs. Tell your doctor if you get sensitive to the cold. Do not become pregnant while taking this medicine or for 9 months after stopping it. Women should inform their health care professional if they wish to become pregnant or think they might be pregnant. Men should not father a child while taking this medicine and for 6 months after stopping it. There is potential for serious side effects to an unborn child. Talk to your health care professional for more information. Do not breast-feed a child while taking this medicine or for 3 months after stopping it. This medicine has caused ovarian failure in some women. This medicine may make  it more difficult to get pregnant. Talk to your health care professional if you are concerned about your fertility. This medicine has caused decreased sperm counts in some men. This may make it more difficult to father a child. Talk to your health care professional if you are concerned about your fertility. This medicine may increase your risk of getting an infection. Call your health care professional for advice if you get a fever, chills, or  sore throat, or other symptoms of a cold or flu. Do not treat yourself. Try to avoid being around people who are sick. Avoid taking medicines that contain aspirin, acetaminophen, ibuprofen, naproxen, or ketoprofen unless instructed by your health care professional. These medicines may hide a fever. Be careful brushing or flossing your teeth or using a toothpick because you may get an infection or bleed more easily. If you have any dental work done, tell your dentist you are receiving this medicine. What side effects may I notice from receiving this medication? Side effects that you should report to your doctor or health care professional as soon as possible: allergic reactions like skin rash, itching or hives, swelling of the face, lips, or tongue breathing problems cough low blood counts - this medicine may decrease the number of white blood cells, red blood cells, and platelets. You may be at increased risk for infections and bleeding nausea, vomiting pain, redness, or irritation at site where injected pain, tingling, numbness in the hands or feet signs and symptoms of bleeding such as bloody or black, tarry stools; red or dark brown urine; spitting up blood or brown material that looks like coffee grounds; red spots on the skin; unusual bruising or bleeding from the eyes, gums, or nose signs and symptoms of a dangerous change in heartbeat or heart rhythm like chest pain; dizziness; fast, irregular heartbeat; palpitations; feeling faint or lightheaded; falls signs and symptoms of infection like fever; chills; cough; sore throat; pain or trouble passing urine signs and symptoms of liver injury like dark yellow or brown urine; general ill feeling or flu-like symptoms; light-colored stools; loss of appetite; nausea; right upper belly pain; unusually weak or tired; yellowing of the eyes or skin signs and symptoms of low red blood cells or anemia such as unusually weak or tired; feeling faint or  lightheaded; falls signs and symptoms of muscle injury like dark urine; trouble passing urine or change in the amount of urine; unusually weak or tired; muscle pain; back pain Side effects that usually do not require medical attention (report to your doctor or health care professional if they continue or are bothersome): changes in taste diarrhea gas hair loss loss of appetite mouth sores This list may not describe all possible side effects. Call your doctor for medical advice about side effects. You may report side effects to FDA at 1-800-FDA-1088. Where should I keep my medication? This drug is given in a hospital or clinic and will not be stored at home. NOTE: This sheet is a summary. It may not cover all possible information. If you have questions about this medicine, talk to your doctor, pharmacist, or health care provider.  2022 Elsevier/Gold Standard (2020-11-01 00:00:00)  Leucovorin injection What is this medication? LEUCOVORIN (loo koe VOR in) is used to prevent or treat the harmful effects of some medicines. This medicine is used to treat anemia caused by a low amount of folic acid in the body. It is also used with 5-fluorouracil (5-FU) to treat colon cancer. This medicine may be used  for other purposes; ask your health care provider or pharmacist if you have questions. What should I tell my care team before I take this medication? They need to know if you have any of these conditions: anemia from low levels of vitamin B-12 in the blood an unusual or allergic reaction to leucovorin, folic acid, other medicines, foods, dyes, or preservatives pregnant or trying to get pregnant breast-feeding How should I use this medication? This medicine is for injection into a muscle or into a vein. It is given by a health care professional in a hospital or clinic setting. Talk to your pediatrician regarding the use of this medicine in children. Special care may be needed. Overdosage: If you  think you have taken too much of this medicine contact a poison control center or emergency room at once. NOTE: This medicine is only for you. Do not share this medicine with others. What if I miss a dose? This does not apply. What may interact with this medication? capecitabine fluorouracil phenobarbital phenytoin primidone trimethoprim-sulfamethoxazole This list may not describe all possible interactions. Give your health care provider a list of all the medicines, herbs, non-prescription drugs, or dietary supplements you use. Also tell them if you smoke, drink alcohol, or use illegal drugs. Some items may interact with your medicine. What should I watch for while using this medication? Your condition will be monitored carefully while you are receiving this medicine. This medicine may increase the side effects of 5-fluorouracil, 5-FU. Tell your doctor or health care professional if you have diarrhea or mouth sores that do not get better or that get worse. What side effects may I notice from receiving this medication? Side effects that you should report to your doctor or health care professional as soon as possible: allergic reactions like skin rash, itching or hives, swelling of the face, lips, or tongue breathing problems fever, infection mouth sores unusual bleeding or bruising unusually weak or tired Side effects that usually do not require medical attention (report to your doctor or health care professional if they continue or are bothersome): constipation or diarrhea loss of appetite nausea, vomiting This list may not describe all possible side effects. Call your doctor for medical advice about side effects. You may report side effects to FDA at 1-800-FDA-1088. Where should I keep my medication? This drug is given in a hospital or clinic and will not be stored at home. NOTE: This sheet is a summary. It may not cover all possible information. If you have questions about this  medicine, talk to your doctor, pharmacist, or health care provider.  2022 Elsevier/Gold Standard (2007-08-21 00:00:00)  Fluorouracil, 5-FU injection What is this medication? FLUOROURACIL, 5-FU (flure oh YOOR a sil) is a chemotherapy drug. It slows the growth of cancer cells. This medicine is used to treat many types of cancer like breast cancer, colon or rectal cancer, pancreatic cancer, and stomach cancer. This medicine may be used for other purposes; ask your health care provider or pharmacist if you have questions. COMMON BRAND NAME(S): Adrucil What should I tell my care team before I take this medication? They need to know if you have any of these conditions: blood disorders dihydropyrimidine dehydrogenase (DPD) deficiency infection (especially a virus infection such as chickenpox, cold sores, or herpes) kidney disease liver disease malnourished, poor nutrition recent or ongoing radiation therapy an unusual or allergic reaction to fluorouracil, other chemotherapy, other medicines, foods, dyes, or preservatives pregnant or trying to get pregnant breast-feeding How should I use this  medication? This drug is given as an infusion or injection into a vein. It is administered in a hospital or clinic by a specially trained health care professional. Talk to your pediatrician regarding the use of this medicine in children. Special care may be needed. Overdosage: If you think you have taken too much of this medicine contact a poison control center or emergency room at once. NOTE: This medicine is only for you. Do not share this medicine with others. What if I miss a dose? It is important not to miss your dose. Call your doctor or health care professional if you are unable to keep an appointment. What may interact with this medication? Do not take this medicine with any of the following medications: live virus vaccines This medicine may also interact with the following medications: medicines  that treat or prevent blood clots like warfarin, enoxaparin, and dalteparin This list may not describe all possible interactions. Give your health care provider a list of all the medicines, herbs, non-prescription drugs, or dietary supplements you use. Also tell them if you smoke, drink alcohol, or use illegal drugs. Some items may interact with your medicine. What should I watch for while using this medication? Visit your doctor for checks on your progress. This drug may make you feel generally unwell. This is not uncommon, as chemotherapy can affect healthy cells as well as cancer cells. Report any side effects. Continue your course of treatment even though you feel ill unless your doctor tells you to stop. In some cases, you may be given additional medicines to help with side effects. Follow all directions for their use. Call your doctor or health care professional for advice if you get a fever, chills or sore throat, or other symptoms of a cold or flu. Do not treat yourself. This drug decreases your body's ability to fight infections. Try to avoid being around people who are sick. This medicine may increase your risk to bruise or bleed. Call your doctor or health care professional if you notice any unusual bleeding. Be careful brushing and flossing your teeth or using a toothpick because you may get an infection or bleed more easily. If you have any dental work done, tell your dentist you are receiving this medicine. Avoid taking products that contain aspirin, acetaminophen, ibuprofen, naproxen, or ketoprofen unless instructed by your doctor. These medicines may hide a fever. Do not become pregnant while taking this medicine. Women should inform their doctor if they wish to become pregnant or think they might be pregnant. There is a potential for serious side effects to an unborn child. Talk to your health care professional or pharmacist for more information. Do not breast-feed an infant while taking  this medicine. Men should inform their doctor if they wish to father a child. This medicine may lower sperm counts. Do not treat diarrhea with over the counter products. Contact your doctor if you have diarrhea that lasts more than 2 days or if it is severe and watery. This medicine can make you more sensitive to the sun. Keep out of the sun. If you cannot avoid being in the sun, wear protective clothing and use sunscreen. Do not use sun lamps or tanning beds/booths. What side effects may I notice from receiving this medication? Side effects that you should report to your doctor or health care professional as soon as possible: allergic reactions like skin rash, itching or hives, swelling of the face, lips, or tongue low blood counts - this medicine may decrease  the number of white blood cells, red blood cells and platelets. You may be at increased risk for infections and bleeding. signs of infection - fever or chills, cough, sore throat, pain or difficulty passing urine signs of decreased platelets or bleeding - bruising, pinpoint red spots on the skin, black, tarry stools, blood in the urine signs of decreased red blood cells - unusually weak or tired, fainting spells, lightheadedness breathing problems changes in vision chest pain mouth sores nausea and vomiting pain, swelling, redness at site where injected pain, tingling, numbness in the hands or feet redness, swelling, or sores on hands or feet stomach pain unusual bleeding Side effects that usually do not require medical attention (report to your doctor or health care professional if they continue or are bothersome): changes in finger or toe nails diarrhea dry or itchy skin hair loss headache loss of appetite sensitivity of eyes to the light stomach upset unusually teary eyes This list may not describe all possible side effects. Call your doctor for medical advice about side effects. You may report side effects to FDA at  1-800-FDA-1088. Where should I keep my medication? This drug is given in a hospital or clinic and will not be stored at home. NOTE: This sheet is a summary. It may not cover all possible information. If you have questions about this medicine, talk to your doctor, pharmacist, or health care provider.  2022 Elsevier/Gold Standard (2020-11-01 00:00:00)

## 2021-02-10 ENCOUNTER — Telehealth: Payer: Self-pay | Admitting: *Deleted

## 2021-02-10 NOTE — Telephone Encounter (Signed)
Called pt to see how she did with her treatment yest.  She reported good except didn't sleep well last hs.  Informed that this may have been from the steroids she received & hopefully will be better tonight. She also has not had a good BM for several days.  She states Dr Lorenso Courier suggested Mag Citrate which she has not done yet but plans to do tomorow if no BM.  She is having some abdominal pain which she says is not abnormal for her.  She describes as a dull ache.  We talked about increasing fluids & fiber foods.  She is passing gas. She says she is drinking OK but not eating as good.  She mentioned a funny feeling in her throat. After discussion, found that she has been drinking refrigerated water.  Informed that this may be from the oxaliplatin & suggested room temp or warm fluids/foods only.  She states that she remembers hearing something about this before.  She reports knowing how to reach [place] if needed.

## 2021-02-10 NOTE — Telephone Encounter (Signed)
-----   Message from Wylene Men, RN sent at 02/09/2021  5:05 PM EST ----- Regarding: [place] ZIRABEV/FOLFOX Patient received 1st time Zirabev/FOLFOX.  Tolerated well. No s/s or c/o distress or discomfort.

## 2021-02-11 ENCOUNTER — Inpatient Hospital Stay: Payer: Medicare Other

## 2021-02-11 ENCOUNTER — Encounter: Payer: Self-pay | Admitting: Hematology and Oncology

## 2021-02-11 ENCOUNTER — Other Ambulatory Visit: Payer: Self-pay

## 2021-02-11 VITALS — BP 122/61 | HR 76 | Temp 97.9°F | Resp 16

## 2021-02-11 DIAGNOSIS — Z5112 Encounter for antineoplastic immunotherapy: Secondary | ICD-10-CM | POA: Diagnosis not present

## 2021-02-11 DIAGNOSIS — Z95828 Presence of other vascular implants and grafts: Secondary | ICD-10-CM

## 2021-02-11 MED ORDER — HEPARIN SOD (PORK) LOCK FLUSH 100 UNIT/ML IV SOLN
500.0000 [IU] | Freq: Once | INTRAVENOUS | Status: AC
Start: 2021-02-11 — End: 2021-02-11
  Administered 2021-02-11: 500 [IU]

## 2021-02-11 MED ORDER — SODIUM CHLORIDE 0.9% FLUSH
10.0000 mL | Freq: Once | INTRAVENOUS | Status: AC
  Administered 2021-02-11: 10 mL

## 2021-02-11 NOTE — Progress Notes (Signed)
[place] [phone]   [phone]  PROGRESS NOTE  Patient Care Team: Jene Every, MD as PCP - General (Family Medicine)  Hematological/Oncological History 1) 12/19/2020: Presented to PCP with RUQ abdominal pain and constipation.    2) 12/28/2020: CT abdomen/pelvis: Short segment colonic wall thickening at the hepatic flexure with pericolonic stranding and nodularity, concerning for primary colonic neoplasm. Recommend correlation with colonoscopy.  Findings of metastatic disease including multifocal omental, mesenteric, and peritoneal/retroperitoneal soft tissue implants, portocaval and retroperitoneal lymphadenopathy and multiple hepatic metastases. Question lucent lesion involving the S2 vertebral body with soft tissue density extending into the adjacent canal and partially expanded left S2 neural foramina. Recommend dedicated MRI of the sacrum for further characterization. Subpleural 0.7 cm nodule in the lingula is indeterminate. Further evaluation with dedicated CT chest is recommended.  A 1.0 cm lesion in the pancreatic neck without pancreatic ductal dilatation. This could be further characterized with MRI of the abdomen.   3) 01/04/2021: Establish care at [facility]   4) 01/06/2021: Underwent colonoscopy with Dr. Kathreen Devoid [facility]).  Findings revealed a circumferential, near obstructing colon mass concerning for malignancy.  Pathology revealed superficial fragments of colonic mucosa with extensive high-grade dysplasia.  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  No suspicious osseous lesion.  6) 01/11/2021: CT chest: Multiple small pulmonary densities in the bilateral lower lobes of indeterminate etiology.  Ill-defined hypodense lesion in the liver measuring at least 5.5 x 4.0 cm as well as few small hypodense lesions concerning for malignant process.  No mediastinal  lymphadenopathy.  No suspicious osseous lesions  7) 01/17/2021: Underwent port placement and liver biopsy.  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  8) 01/26/2021: intended start of FOLFOX +Bevacizumab chemotherapy. Held due to Bilirubin 9.0, AST 252, ALT 282. Admitted to [facility].   Interval History:  Norma Morales returns for follow-up for metastatic colon cancer.   On exam today, Norma Morales is accompanied by her husband.  She notes that the primary symptom she has been having in the interim since her last visit has been constipation.  She has been having difficulty keeping her bowels moving on a regular basis.  She reports that as result of this her appetite has been decreased.  She also notes that she is getting weaker and is walking less.  She reports that she used to walk 6000 steps per day and be quite an active person but now she is unable to do that.  She notes that she did have a good bowel movement this morning but that when she does finally passes stool it is a "hard ball".  She has been having some sweats.  She denies any fevers, chills, nausea, vomiting, or diarrhea.  A full 10 point ROS is listed below.  She is willing and able to proceed with chemotherapy treatment today.  MEDICAL HISTORY:  Past Medical History:  Diagnosis Date   Cancer ([place])    colon   Hyperlipidemia     SURGICAL HISTORY: Past Surgical History:  Procedure Laterality Date   BILIARY STENT PLACEMENT N/A 01/27/2021   Procedure: BILIARY STENT PLACEMENT;  Surgeon: Clarene Essex, MD;  Location: [facility] ENDOSCOPY;  Service: Endoscopy;  Laterality: N/A;   ERCP N/A 01/27/2021   Procedure: ENDOSCOPIC RETROGRADE CHOLANGIOPANCREATOGRAPHY (ERCP);  Surgeon: Clarene Essex, MD;  Location: [facility];  Service: Endoscopy;  Laterality: N/A;   IR IMAGING GUIDED PORT INSERTION  01/17/2021   IR US GUIDE BX ASP/DRAIN  01/17/2021   SPHINCTEROTOMY  01/27/2021   Procedure: Joan Mayans;   Surgeon: Clarene Essex, MD;  Location: [facility] ENDOSCOPY;  Service: Endoscopy;;   TONSILLECTOMY      SOCIAL HISTORY: Social History   Socioeconomic History   Marital status: Married    Spouse name: Not on file   Number of children: Not on file   Years of education: Not on file   Highest education level: Not on file  Occupational History   Not on file  Tobacco Use   Smoking status: Never   Smokeless tobacco: Never  Substance and Sexual Activity   Alcohol use: Never   Drug use: Never   Sexual activity: Not on file  Other Topics Concern   Not on file  Social History Narrative   Not on file   Social Determinants of Health   Financial Resource Strain: Not on file  Food Insecurity: Not on file  Transportation Needs: Not on file  Physical Activity: Not on file  Stress: Not on file  Social Connections: Not on file  Intimate Partner Violence: Not on file    FAMILY HISTORY: No family history on file.  ALLERGIES:  is allergic to aspirin.  MEDICATIONS:  Current Outpatient Medications  Medication Sig Dispense Refill   alendronate (FOSAMAX) 70 MG tablet Take 70 mg by mouth every Sunday.     atenolol (TENORMIN) 25 MG tablet Take 25 mg by mouth at bedtime.     benzonatate (TESSALON) 100 MG capsule Take 1 capsule (100 mg total) by mouth 3 (three) times daily. 20 capsule 0   ezetimibe (ZETIA) 10 MG tablet Take 10 mg by mouth at bedtime.     NON FORMULARY Take 30 mLs by mouth See admin instructions. Source of Life Gold Multivitamin Liquid-/Healthy Immune System & Well-Being (includes Vitamins D3, B12, K2 & Over 120 Whole Food Nutrients)- Drink 30 ml's by mouth once a day     ondansetron (ZOFRAN) 8 MG tablet Take 1 tablet (8 mg total) by mouth every 8 (eight) hours as needed for nausea or vomiting. 60 tablet 3   oxyCODONE (ROXICODONE) 5 MG immediate release tablet Take 1 tablet (5 mg total) by mouth every 8 (eight) hours as needed. (Patient taking differently: Take 5 mg by mouth every 8  (eight) hours as needed (for pain).) 90 tablet 0   prochlorperazine (COMPAZINE) 10 MG tablet Take 1 tablet (10 mg total) by mouth every 6 (six) hours as needed for nausea or vomiting. 60 tablet 3   senna (SENOKOT) 8.6 MG TABS tablet Take 1 tablet (8.6 mg total) by mouth daily as needed for mild constipation. 30 tablet 0   No current facility-administered medications for this visit.    REVIEW OF SYSTEMS:   Constitutional: ( - ) fevers, ( - )  chills , ( - ) night sweats Eyes: ( - ) blurriness of vision, ( - ) double vision, ( - ) watery eyes Ears, nose, mouth, throat, and face: ( - ) mucositis, ( - ) sore throat Respiratory: ( - ) cough, ( - ) dyspnea, ( - ) wheezes Cardiovascular: ( - ) palpitation, ( - ) chest discomfort, ( - ) lower extremity swelling Gastrointestinal:  ( - ) nausea, ( - ) heartburn, ( - ) change in bowel habits Skin: ( - ) abnormal skin rashes Lymphatics: ( - ) new lymphadenopathy, ( - ) easy bruising Neurological: ( - ) numbness, ( - ) tingling, ( - )  new weaknesses Behavioral/Psych: ( - ) mood change, ( - ) new changes  All other systems were reviewed with the patient and are negative.  PHYSICAL EXAMINATION: ECOG PERFORMANCE STATUS: 1 - Symptomatic but completely ambulatory  Vitals:   02/09/21 1000  BP: 111/60  Pulse: 81  Resp: 17  Temp: 98.7 F (37.1 C)  SpO2: 100%    Filed Weights   02/09/21 1000  Weight: 140 lb 4.8 oz (63.6 kg)     GENERAL: mildly jaundiced appearing Caucasian female in NAD  SKIN:  rashes or significant lesions. Jaundice.  EYES: conjunctiva are pink and non-injected, sclera icteric LUNGS: clear to auscultation and percussion with normal breathing effort HEART: regular rate & rhythm and no murmurs and no lower extremity edema Musculoskeletal: no cyanosis of digits and no clubbing  PSYCH: alert & oriented x 3, fluent speech NEURO: no focal motor/sensory deficits  LABORATORY DATA:  I have reviewed the data as listed CBC Latest  Ref Rng & Units 02/09/2021 01/28/2021 01/27/2021  WBC 4.0 - 10.5 K/uL 14.1(H) 12.0(H) 11.1(H)  Hemoglobin 12.0 - 15.0 g/dL 9.6(L) 9.0(L) 8.8(L)  Hematocrit 36.0 - 46.0 % 29.4(L) 28.5(L) 26.7(L)  Platelets 150 - 400 K/uL 467(H) 470(H) 453(H)    CMP Latest Ref Rng & Units 02/09/2021 01/28/2021 01/27/2021  Glucose 70 - 99 mg/dL 127(H) 120(H) 132(H)  BUN 8 - 23 mg/dL _0 Creatinine 0.44 - 1.00 mg/dL 0.61 0.50 0.46  Sodium 135 - 145 mmol/L 131(L) 134(L) 132(L)  Potassium 3.5 - 5.1 mmol/L 4.0 3.9 4.0  Chloride 98 - 111 mmol/L 98 101 99  CO2 22 - 32 mmol/L _1 Calcium 8.9 - 10.3 mg/dL 8.6(L) 8.1(L) 8.1(L)  Total Protein 6.5 - 8.1 g/dL 6.7 5.8(L) -  Total Bilirubin 0.3 - 1.2 mg/dL 1.7(H) 4.1(H) -  Alkaline Phos 38 - 126 U/L 420(H) 430(H) -  AST 15 - 41 U/L 68(H) 114(H) -  ALT 0 - 44 U/L 49(H) 156(H) -    PATHOLOGY: SURGICAL PATHOLOGY  CASE: [id number]  A. LIVER, RIGHT, MASS, BIOPSY:  - Metastatic adenocarcinoma consistent with colorectal origin.   COMMENT:  Sections demonstrate metastatic moderately differentiated adenocarcinoma  with dirty necrosis and extracellular mucin involving hepatic  parenchyma. A limited panel of immunohistochemical stains was performed  and the adenocarcinoma is positive for cytokeratin 20 and CDX-2 while  negative for cytokeratin 7. The morphologic findings in conjunction with  the pattern of immunohistochemical staining is consistent with  metastatic adenocarcinoma of colorectal origin.   RADIOGRAPHIC STUDIES: I have personally reviewed the radiological images as listed and agreed with the findings in the report. DG Chest 2 View  Result Date: 01/26/2021 CLINICAL DATA:  Cough for 2 days. EXAM: CHEST - 2 VIEW COMPARISON:  CT chest 01/11/2021. CT abdomen and pelvis 01/18/2021. FINDINGS: Linear nodular densities in the left lung base are unchanged. There is no focal lung consolidation, pleural effusion or pneumothorax. The heart is normal in size.  Right chest port catheter tip projects over the mid SVC. No acute fractures are identified. IMPRESSION: 1. Stable nodular and linear densities in the left lung base. 2. No new focal lung infiltrate. Electronically Signed   By: Ronney Asters M.D.   On: 01/26/2021 22:18   CT Abdomen Pelvis W Contrast  Result Date: 01/18/2021 CLINICAL DATA:  Concern for abdominal abscess or infection. History of metastatic colon cancer. Status post core biopsy of right hepatic mass. EXAM: CT ABDOMEN AND PELVIS WITH CONTRAST TECHNIQUE: Multidetector CT  imaging of the abdomen and pelvis was performed using the standard protocol following bolus administration of intravenous contrast. CONTRAST:  50m OMNIPAQUE IOHEXOL 350 MG/ML SOLN COMPARISON:  None. FINDINGS: Lower chest: Bibasilar subpleural atelectasis/scarring. A 7 mm nodular density in the left lower lobe most consistent with metastatic disease. No intra-abdominal free air.  Small free fluid in the pelvis. Hepatobiliary: Multiple hepatic hypodense lesions consistent with metastatic disease. There is a 5.3 x 5.1 cm hypoenhancing area in the right lobe of the liver with small pockets of air corresponding to the biopsied lesion. This likely represent an infiltrative mass or metastasis. An infectious process or developing abscess is less likely but not excluded. No drainable fluid collection identified. No calcified gallstone. There is dilatation of the intrahepatic and extrahepatic biliary tree. The common bile duct measures 17 mm in diameter. There is somewhat narrowing of the central CBD which is suboptimally evaluated on this CT but may be related to mass effect and compression by adjacent adenopathy. Further evaluation with MRI/MRCP is recommended. Pancreas: There is a 12 x 10 mm low attenuating nodule in the body of the pancreas (31/2). Several additional low attenuating lesions in the region of the head of the pancreas noted which may represent peripancreatic adenopathy or  metastatic disease. No active inflammatory changes of the pancreas. No gland atrophy or dilatation of the main pancreatic duct. Spleen: Normal in size without focal abnormality. Adrenals/Urinary Tract: The adrenal glands are unremarkable. The kidneys, visualized ureters, and urinary bladder appear unremarkable. Stomach/Bowel: There is sigmoid diverticulosis without active inflammatory changes. There is moderate stool throughout the colon. There is thickened and irregular appearance of the wall of the ascending colon in the region of the hepatic flexure likely representing known colonic mass/malignancy. There is no bowel obstruction. The appendix is normal. Vascular/Lymphatic: Mild aortoiliac atherosclerotic disease. The IVC is unremarkable. No portal venous gas. Retroperitoneal adenopathy. Reproductive: The uterus is grossly unremarkable. Other: Scattered omental implants consistent with metastatic disease. The largest implant in the left lower quadrant measures approximately 17 x 20 mm (65/2). Musculoskeletal: Degenerative changes of the spine. No acute osseous pathology. IMPRESSION: 1. Thickened and irregular appearance of the wall of the ascending colon in the region of the hepatic flexure likely representing known colonic mass/malignancy. 2. Multiple hepatic hypodense lesions consistent with metastatic disease. The largest lesion in the right lobe of the liver has been biopsied. No drainable fluid collection/abscess. 3. Dilated intrahepatic and extrahepatic biliary tree with findings concerning for narrowing of the central CBD. Further evaluation with MRI/MRCP is recommended. 4. Scattered omental implants consistent with metastatic disease. 5. Left lower lobe pulmonary nodule/metastatic disease. 6. Sigmoid diverticulosis. No bowel obstruction. Normal appendix. 7. Aortic Atherosclerosis (ICD10-I70.0). Electronically Signed   By: AAnner CreteM.D.   On: 01/18/2021 21:04   MR 3D Recon At Scanner  Result  Date: 01/19/2021 CLINICAL DATA:  Right upper quadrant abdominal pain, nondiagnostic ultrasound. EXAM: MRI ABDOMEN WITHOUT AND WITH CONTRAST (INCLUDING MRCP) TECHNIQUE: Multiplanar multisequence MR imaging of the abdomen was performed both before and after the administration of intravenous contrast. Heavily T2-weighted images of the biliary and pancreatic ducts were obtained, and three-dimensional MRCP images were rendered by post processing. CONTRAST:  729mGADAVIST GADOBUTROL 1 MMOL/ML IV SOLN COMPARISON:  01/18/2021. FINDINGS: Lower chest: Atelectasis or infiltrate is noted at the lung bases. There is redemonstration of a nodular lesion in the left lower lobe measuring 8 mm. Trace bilateral pleural effusions are present. Hepatobiliary: There is a heterogeneous ill-defined masslike lesion with  rim enhancement in the posterior right lobe of the liver measuring 5.6 x 5.6 cm. Additional smaller T2 hyperintensities are present in the right lobe of the liver with rim enhancement. Intrahepatic and extrahepatic biliary ductal dilatation is noted. The proximal common bile duct is distended measuring 1.7 cm in diameter. There is segmental narrowing with smooth shouldering of the mid common bile duct, which may be related to extrinsic compression from lymphadenopathy. No enhancing mass is identified in this region. The distal common bile duct is normal in caliber. No choledocholithiasis. The gallbladder is contracted and contains T1 hyperintense material, possible sludge. Pancreas: The pancreas enhances normally. The previously described low-attenuation nodule in the body of the pancreas is not well seen on this exam. No significant pancreatic ductal dilatation. Spleen:  Within normal limits in size and appearance. Adrenals/Urinary Tract: The adrenal glands are within normal limits. No evidence of hydronephrosis. A few subcentimeter T2 hyperintensities are seen in the kidneys which are too small to further characterize and  may represent cysts. Stomach/Bowel: No bowel obstruction. There is focal colonic wall narrowing with wall thickening and enhancement involving the hepatic flexure, which may represent patient's known mass. A moderate amount of retained stool is present in the colon. Vascular/Lymphatic: Multiple prominent lymph nodes are seen in the gastrohepatic ligament, porta hepatis, and retroperitoneum. There are nodules with restricted diffusion in the anterior peritoneum, concerning for metastatic disease. The aorta is normal in caliber. The splenic vein, portal vein, and superior mesenteric vein are patent. Other:  No free fluid. Musculoskeletal: No suspicious bone lesions identified. IMPRESSION: 1. Multiple rim enhancing lesions in the liver, the largest measuring 5.6 x 5.6 cm, suggesting metastatic disease. Given history of recent biopsy, correlation with biopsy results is recommended. 2. Intrahepatic and extrahepatic biliary ductal dilatation. There is smooth shouldering of the mid common bile duct with segmental narrowing which may be due to extrinsic mass effect from local lymphadenopathy. No definite infiltrative mass is seen. 3. Enhancing mass at the colonic hepatic flexure, may represent patient's known colon cancer. 4. Multiple enlarged lymph nodes in the porta hepatis, gastrohepatic ligament, and retroperitoneum. There also nodules with restricted diffusion in the anterior peritoneum, concerning for peritoneal metastasis. 5. Left lower lobe pulmonary nodule, possible metastatic disease. Electronically Signed   By: Brett Fairy M.D.   On: 01/19/2021 04:22   IR US Guide Bx Asp/Drain  Result Date: 01/17/2021 INDICATION: HEPATIC METASTATIC DISEASE. EXAM: ULTRASOUND CORE BIOPSY RIGHT HEPATIC MASS MEDICATIONS: 1% LIDOCAINE ANESTHESIA/SEDATION: Moderate (conscious) sedation was employed during this procedure. A total of Versed 2.0 mg and Fentanyl 100 mcg was administered intravenously by the radiology nurse. Total  intra-service moderate Sedation Time: 10 minutes. The patient's level of consciousness and vital signs were monitored continuously by radiology nursing throughout the procedure under my direct supervision. FLUOROSCOPY TIME:  Fluoroscopy Time: None. COMPLICATIONS: None immediate. PROCEDURE: Informed written consent was obtained from the patient after a thorough discussion of the procedural risks, benefits and alternatives. All questions were addressed. Maximal Sterile Barrier Technique was utilized including caps, mask, sterile gowns, sterile gloves, sterile drape, hand hygiene and skin antiseptic. A timeout was performed prior to the initiation of the procedure. Previous imaging reviewed. Preliminary ultrasound performed. Right hepatic lesion was localized and marked for a mid axillary line approach through a lower intercostal space. Under sterile conditions and local anesthesia, a 17 gauge 11.8 cm guide was advanced to the lesion. Needle position confirmed with ultrasound. Images obtained for documentation. 18 gauge core biopsies obtained under direct  ultrasound. Samples were intact and non fragmented. These were placed in formalin. Needle tract occluded with Gel-Foam. Postprocedure imaging demonstrates no hemorrhage or hematoma. Patient tolerated biopsy well. IMPRESSION: Successful ultrasound right hepatic mass 18 gauge core biopsy Electronically Signed   By: Jerilynn Mages.  Shick M.D.   On: 01/17/2021 14:56   DG ERCP WITH SPHINCTEROTOMY  Result Date: 01/27/2021 CLINICAL DATA:  ERCP with sphincterotomy and stent placement. EXAM: ERCP TECHNIQUE: Multiple spot images obtained with the fluoroscopic device and submitted for interpretation post-procedure. FLUOROSCOPY TIME:  4 minutes, 51 seconds COMPARISON:  MRCP-01/19/2021 FINDINGS: 14 spot intraoperative fluoroscopic images of the right upper abdominal quadrant during ERCP are provided for review. Initial image demonstrates an ERCP probe overlying the right upper abdominal  quadrant Subsequent images demonstrate opacification of the central aspect of the biliary tree which appears markedly dilated with non opacification and apparent malignant occlusion involving the majority of the CBD. Completion images demonstrates placement of an internal biliary stent traversing the CBD with residual nonocclusive narrowing at its mid aspect. IMPRESSION: ERCP with biliary stenting as above. These images were submitted for radiologic interpretation only. Please see the procedural report for the amount of contrast and the fluoroscopy time utilized. Electronically Signed   By: Sandi Mariscal M.D.   On: 01/27/2021 14:17   MR ABDOMEN MRCP W WO CONTAST  Result Date: 01/19/2021 CLINICAL DATA:  Right upper quadrant abdominal pain, nondiagnostic ultrasound. EXAM: MRI ABDOMEN WITHOUT AND WITH CONTRAST (INCLUDING MRCP) TECHNIQUE: Multiplanar multisequence MR imaging of the abdomen was performed both before and after the administration of intravenous contrast. Heavily T2-weighted images of the biliary and pancreatic ducts were obtained, and three-dimensional MRCP images were rendered by post processing. CONTRAST:  64m GADAVIST GADOBUTROL 1 MMOL/ML IV SOLN COMPARISON:  01/18/2021. FINDINGS: Lower chest: Atelectasis or infiltrate is noted at the lung bases. There is redemonstration of a nodular lesion in the left lower lobe measuring 8 mm. Trace bilateral pleural effusions are present. Hepatobiliary: There is a heterogeneous ill-defined masslike lesion with rim enhancement in the posterior right lobe of the liver measuring 5.6 x 5.6 cm. Additional smaller T2 hyperintensities are present in the right lobe of the liver with rim enhancement. Intrahepatic and extrahepatic biliary ductal dilatation is noted. The proximal common bile duct is distended measuring 1.7 cm in diameter. There is segmental narrowing with smooth shouldering of the mid common bile duct, which may be related to extrinsic compression from  lymphadenopathy. No enhancing mass is identified in this region. The distal common bile duct is normal in caliber. No choledocholithiasis. The gallbladder is contracted and contains T1 hyperintense material, possible sludge. Pancreas: The pancreas enhances normally. The previously described low-attenuation nodule in the body of the pancreas is not well seen on this exam. No significant pancreatic ductal dilatation. Spleen:  Within normal limits in size and appearance. Adrenals/Urinary Tract: The adrenal glands are within normal limits. No evidence of hydronephrosis. A few subcentimeter T2 hyperintensities are seen in the kidneys which are too small to further characterize and may represent cysts. Stomach/Bowel: No bowel obstruction. There is focal colonic wall narrowing with wall thickening and enhancement involving the hepatic flexure, which may represent patient's known mass. A moderate amount of retained stool is present in the colon. Vascular/Lymphatic: Multiple prominent lymph nodes are seen in the gastrohepatic ligament, porta hepatis, and retroperitoneum. There are nodules with restricted diffusion in the anterior peritoneum, concerning for metastatic disease. The aorta is normal in caliber. The splenic vein, portal vein, and superior mesenteric vein  are patent. Other:  No free fluid. Musculoskeletal: No suspicious bone lesions identified. IMPRESSION: 1. Multiple rim enhancing lesions in the liver, the largest measuring 5.6 x 5.6 cm, suggesting metastatic disease. Given history of recent biopsy, correlation with biopsy results is recommended. 2. Intrahepatic and extrahepatic biliary ductal dilatation. There is smooth shouldering of the mid common bile duct with segmental narrowing which may be due to extrinsic mass effect from local lymphadenopathy. No definite infiltrative mass is seen. 3. Enhancing mass at the colonic hepatic flexure, may represent patient's known colon cancer. 4. Multiple enlarged lymph  nodes in the porta hepatis, gastrohepatic ligament, and retroperitoneum. There also nodules with restricted diffusion in the anterior peritoneum, concerning for peritoneal metastasis. 5. Left lower lobe pulmonary nodule, possible metastatic disease. Electronically Signed   By: Brett Fairy M.D.   On: 01/19/2021 04:22   ECHOCARDIOGRAM COMPLETE  Result Date: 01/28/2021    ECHOCARDIOGRAM REPORT   Patient Name:   DENYS LABREE Date of Exam: 01/28/2021 Medical Rec #:  [id number]    Height:       63.0 in Accession #:    [id number]   Weight:       138.2 lb Date of Birth:  [birth year]    BSA:          1.653 m Patient Age:    32 years     BP:           135/73 mmHg Patient Gender: F            HR:           65 bpm. Exam Location:  Inpatient Procedure: 2D Echo, Cardiac Doppler and Color Doppler Indications:    Tachycardia [[id number]]  History:        Patient has no prior history of Echocardiogram examinations.                 Risk Factors:Dyslipidemia.  Sonographer:    Bernadene Person RDCS Referring Phys: [id number] Aneta  1. Left ventricular ejection fraction, by estimation, is 65 to 70%. The left ventricle has normal function. The left ventricle has no regional wall motion abnormalities. Left ventricular diastolic parameters were normal.  2. Right ventricular systolic function is normal. The right ventricular size is normal. There is normal pulmonary artery systolic pressure.  3. Left atrial size was mildly dilated.  4. Right atrial size was mildly dilated.  5. The mitral valve is normal in structure. Trivial mitral valve regurgitation. No evidence of mitral stenosis.  6. The aortic valve is normal in structure. Aortic valve regurgitation is not visualized. No aortic stenosis is present. FINDINGS  Left Ventricle: Left ventricular ejection fraction, by estimation, is 65 to 70%. The left ventricle has normal function. The left ventricle has no regional wall motion abnormalities. The left ventricular internal  cavity size was normal in size. There is  no left ventricular hypertrophy. Left ventricular diastolic parameters were normal. Right Ventricle: The right ventricular size is normal. Right vetricular wall thickness was not well visualized. Right ventricular systolic function is normal. There is normal pulmonary artery systolic pressure. The tricuspid regurgitant velocity is 2.33 m/s, and with an assumed right atrial pressure of 3 mmHg, the estimated right ventricular systolic pressure is 17.4 mmHg. Left Atrium: Left atrial size was mildly dilated. Right Atrium: Right atrial size was mildly dilated. Pericardium: There is no evidence of pericardial effusion. Mitral Valve: The mitral valve is normal in structure. Trivial mitral valve regurgitation. No evidence of mitral  valve stenosis. Tricuspid Valve: The tricuspid valve is grossly normal. Tricuspid valve regurgitation is not demonstrated. Aortic Valve: The aortic valve is normal in structure. Aortic valve regurgitation is not visualized. No aortic stenosis is present. Pulmonic Valve: The pulmonic valve was grossly normal. Pulmonic valve regurgitation is trivial. Aorta: The aortic root and ascending aorta are structurally normal, with no evidence of dilitation. IAS/Shunts: The interatrial septum was not well visualized.  LEFT VENTRICLE PLAX 2D LVIDd:         4.10 cm   Diastology LVIDs:         2.30 cm   LV e' medial:    10.10 cm/s LV PW:         0.80 cm   LV E/e' medial:  8.6 LV IVS:        0.80 cm   LV e' lateral:   9.68 cm/s LVOT diam:     1.90 cm   LV E/e' lateral: 9.0 LV SV:         59 LV SV Index:   36 LVOT Area:     2.84 cm  RIGHT VENTRICLE RV S prime:     9.60 cm/s TAPSE (M-mode): 1.7 cm LEFT ATRIUM             Index        RIGHT ATRIUM           Index LA diam:        3.40 cm 2.06 cm/m   RA Area:     18.50 cm LA Vol (A2C):   67.9 ml 41.08 ml/m  RA Volume:   52.30 ml  31.65 ml/m LA Vol (A4C):   52.4 ml 31.71 ml/m LA Biplane Vol: 63.2 ml 38.24 ml/m  AORTIC  VALVE             PULMONIC VALVE LVOT Vmax:   94.60 cm/s  PR End Diast Vel: 4.33 msec LVOT Vmean:  61.100 cm/s LVOT VTI:    0.208 m  AORTA Ao Root diam: 3.30 cm Ao Asc diam:  3.10 cm MITRAL VALVE               TRICUSPID VALVE MV Area (PHT): 4.39 cm    TR Peak grad:   21.7 mmHg MV Decel Time: 173 msec    TR Vmax:        233.00 cm/s MV E velocity: 87.30 cm/s MV A velocity: 53.80 cm/s  SHUNTS MV E/A ratio:  1.62        Systemic VTI:  0.21 m                            Systemic Diam: 1.90 cm Mertie Moores MD Electronically signed by Mertie Moores MD Signature Date/Time: 01/28/2021/3:09:20 PM    Final    IR IMAGING GUIDED PORT INSERTION  Result Date: 01/17/2021 CLINICAL DATA:  Imaging findings consistent with metastatic colon cancer to liver EXAM: RIGHT INTERNAL JUGULAR SINGLE LUMEN POWER PORT CATHETER INSERTION Date:  01/17/2021 01/17/2021 2:30 pm Radiologist:  M. Daryll Brod, MD Guidance:  Ultrasound and fluoroscopic MEDICATIONS: 1% lidocaine local with epinephrine ANESTHESIA/SEDATION: Versed 2.0 mg IV; Fentanyl 100 mcg IV; Moderate Sedation Time:  23 minute The patient was continuously monitored during the procedure by the interventional radiology nurse under my direct supervision. FLUOROSCOPY TIME:  0 minutes, 36 seconds (1 mGy) COMPLICATIONS: None immediate. CONTRAST:  None. PROCEDURE: Informed consent was obtained from the patient following explanation of the  procedure, risks, benefits and alternatives. The patient understands, agrees and consents for the procedure. All questions were addressed. A time out was performed. Maximal barrier sterile technique utilized including caps, mask, sterile gowns, sterile gloves, large sterile drape, hand hygiene, and 2% chlorhexidine scrub. Under sterile conditions and local anesthesia, right internal jugular micropuncture venous access was performed. Access was performed with ultrasound. Images were obtained for documentation of the patent right internal jugular vein. A  guide wire was inserted followed by a transitional dilator. This allowed insertion of a guide wire and catheter into the IVC. Measurements were obtained from the SVC / RA junction back to the right IJ venotomy site. In the right infraclavicular chest, a subcutaneous pocket was created over the second anterior rib. This was done under sterile conditions and local anesthesia. 1% lidocaine with epinephrine was utilized for this. A 2.5 cm incision was made in the skin. Blunt dissection was performed to create a subcutaneous pocket over the right pectoralis major muscle. The pocket was flushed with saline vigorously. There was adequate hemostasis. The port catheter was assembled and checked for leakage. The port catheter was secured in the pocket with two retention sutures. The tubing was tunneled subcutaneously to the right venotomy site and inserted into the SVC/RA junction through a valved peel-away sheath. Position was confirmed with fluoroscopy. Images were obtained for documentation. The patient tolerated the procedure well. No immediate complications. Incisions were closed in a two layer fashion with 4 - 0 Vicryl suture. Dermabond was applied to the skin. The port catheter was accessed, blood was aspirated followed by saline and heparin flushes. Needle was removed. A dry sterile dressing was applied. IMPRESSION: Ultrasound and fluoroscopically guided right internal jugular single lumen power port catheter insertion. Tip in the SVC/RA junction. Catheter ready for use. Electronically Signed   By: Jerilynn Mages.  Shick M.D.   On: 01/17/2021 14:53    ASSESSMENT & PLAN Norma Morales is a 75 y.o. 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  She presents to start chemotherapy.   The mainstay treatment will be chemotherapy with the rationale to prolong life and help improve cancer related symptoms.  This is unfortunately not a curable disease.  The proposed  treatment regimen includes infusional 5-FU, oxaliplatin and bevacizumab given once every 2 weeks.  The dose, frequency and common side effects were discussed with the patient.  Common side effects include fatigue, appetite loss, nausea, diarrhea, cold sensitivity, neuropathy, poor wound healing and increased risk of bleeding.  We will plan to obtain repeat imaging in 2 to 3 months to assess treatment response.  Additionally, we have ordered additional biomarkers including KRAS, NRAS, BRAS and MSI/MMR. Patient is scheduled for chemoeducation on 01/25/2021 and tenative start date for chemotherapy on 02/09/2021.    # Elevated Bilirubin/LFTs -- Patient was admitted after last visit 2 weeks ago and underwent ERCP with biliary stent placement. --Solid improvement in her LFTs.  AST today 68, ALT 49, and total bilirubin 1.7.  These are likely due to direct damage of the liver from the tumor --Okay to proceed with chemotherapy today.  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  --Recommended treatment regimen includes FOLFOX plus Bevacizumab, Cycle 1 Day 1 on 02/09/2021. --RTC in 2 weeks for continued FOLFOX chemotherapy.  #RUQ abdominal pain: --Likely secondary to liver metastases --Currently  on oxycodone 5 mg q 8 hours with improvement of pain. Refill sent.  --Advised patient to be aggressive with her bowel regimen to minimize constipation.   #Supportive care: --Port has been placed. EMLA cream ordered.  --Referral to nutrition has been placed --Zofran and Compazine prescriptions sent  No orders of the defined types were placed in this encounter.   All questions were answered. The patient knows to call the clinic with any problems, questions or concerns.  I have spent a total of 30 minutes minutes of  face-to-face and non-face-to-face time, preparing to see the patient, obtaining and/or reviewing separately obtained history, performing a medically appropriate examination, counseling and educating the patient, ordering medications, referring and communicating with other health care professionals, documenting clinical information in the electronic health record, and care coordination.   Ledell Peoples, MD Department of Hematology/Oncology [facility] at [facility] Phone: [phone] Pager: [phone] Email: [email or web address]

## 2021-02-13 ENCOUNTER — Telehealth: Payer: Self-pay

## 2021-02-13 NOTE — Telephone Encounter (Signed)
I do not recommend she takes the dulcolax or senna all at the same time. Please let her know that Magnesium citrate should be readily available at [facility], [place], or [facility]. If she cannot find these then consider Miralax instead.

## 2021-02-13 NOTE — Telephone Encounter (Signed)
T/C from pt stating her constipation  has not been resolved.  She could not find liquid magnesium citrate and had to get the pills.  She took the max #4 yesterday with no results and wants to know if she can take #4 again today, but take them all at one time?    Please advise

## 2021-02-23 ENCOUNTER — Other Ambulatory Visit: Payer: Self-pay | Admitting: Hematology and Oncology

## 2021-02-23 ENCOUNTER — Inpatient Hospital Stay: Payer: Medicare Other

## 2021-02-23 ENCOUNTER — Inpatient Hospital Stay: Payer: Medicare Other | Admitting: Nutrition

## 2021-02-23 ENCOUNTER — Other Ambulatory Visit: Payer: Self-pay

## 2021-02-23 ENCOUNTER — Inpatient Hospital Stay (HOSPITAL_BASED_OUTPATIENT_CLINIC_OR_DEPARTMENT_OTHER): Payer: Medicare Other | Admitting: Hematology and Oncology

## 2021-02-23 VITALS — BP 119/59 | HR 70 | Temp 97.7°F | Resp 17 | Wt 136.9 lb

## 2021-02-23 DIAGNOSIS — R160 Hepatomegaly, not elsewhere classified: Secondary | ICD-10-CM

## 2021-02-23 DIAGNOSIS — C799 Secondary malignant neoplasm of unspecified site: Secondary | ICD-10-CM

## 2021-02-23 DIAGNOSIS — C189 Malignant neoplasm of colon, unspecified: Secondary | ICD-10-CM

## 2021-02-23 DIAGNOSIS — C787 Secondary malignant neoplasm of liver and intrahepatic bile duct: Secondary | ICD-10-CM

## 2021-02-23 DIAGNOSIS — Z5112 Encounter for antineoplastic immunotherapy: Secondary | ICD-10-CM | POA: Diagnosis not present

## 2021-02-23 DIAGNOSIS — Z95828 Presence of other vascular implants and grafts: Secondary | ICD-10-CM

## 2021-02-23 LAB — CMP (CANCER CENTER ONLY)
ALT: 35 U/L (ref 0–44)
AST: 47 U/L — ABNORMAL HIGH (ref 15–41)
Albumin: 3 g/dL — ABNORMAL LOW (ref 3.5–5.0)
Alkaline Phosphatase: 317 U/L — ABNORMAL HIGH (ref 38–126)
Anion gap: 6 (ref 5–15)
BUN: 11 mg/dL (ref 8–23)
CO2: 27 mmol/L (ref 22–32)
Calcium: 8.8 mg/dL — ABNORMAL LOW (ref 8.9–10.3)
Chloride: 99 mmol/L (ref 98–111)
Creatinine: 0.45 mg/dL (ref 0.44–1.00)
GFR, Estimated: >60 mL/min (ref >60)
Glucose, Bld: 106 mg/dL — ABNORMAL HIGH (ref 70–99)
Potassium: 4.2 mmol/L (ref 3.5–5.1)
Sodium: 132 mmol/L — ABNORMAL LOW (ref 135–145)
Total Bilirubin: 1 mg/dL (ref 0.3–1.2)
Total Protein: 6.4 g/dL — ABNORMAL LOW (ref 6.5–8.1)

## 2021-02-23 LAB — CBC WITH DIFFERENTIAL (CANCER CENTER ONLY)
Abs Immature Granulocytes: 0.01 10*3/uL (ref 0.00–0.07)
Basophils Absolute: 0.1 10*3/uL (ref 0.0–0.1)
Basophils Relative: 2 %
Eosinophils Absolute: 0 10*3/uL (ref 0.0–0.5)
Eosinophils Relative: 1 %
HCT: 27.4 % — ABNORMAL LOW (ref 36.0–46.0)
Hemoglobin: 8.9 g/dL — ABNORMAL LOW (ref 12.0–15.0)
Immature Granulocytes: 0 %
Lymphocytes Relative: 9 %
Lymphs Abs: 0.4 10*3/uL — ABNORMAL LOW (ref 0.7–4.0)
MCH: 24.9 pg — ABNORMAL LOW (ref 26.0–34.0)
MCHC: 32.5 g/dL (ref 30.0–36.0)
MCV: 76.5 fL — ABNORMAL LOW (ref 80.0–100.0)
Monocytes Absolute: 1 10*3/uL (ref 0.1–1.0)
Monocytes Relative: 22 %
Neutro Abs: 3 10*3/uL (ref 1.7–7.7)
Neutrophils Relative %: 66 %
Platelet Count: 406 10*3/uL — ABNORMAL HIGH (ref 150–400)
RBC: 3.58 MIL/uL — ABNORMAL LOW (ref 3.87–5.11)
RDW: 15.3 % (ref 11.5–15.5)
WBC Count: 4.6 10*3/uL (ref 4.0–10.5)
nRBC: 0 % (ref 0.0–0.2)

## 2021-02-23 LAB — TOTAL PROTEIN, URINE DIPSTICK: Protein, ur: <30 mg/dL — AB

## 2021-02-23 LAB — CEA (IN HOUSE-CHCC): CEA (CHCC-In House): 54.01 ng/mL — ABNORMAL HIGH (ref 0.00–5.00)

## 2021-02-23 MED ORDER — SODIUM CHLORIDE 0.9 % IV SOLN
4.8000 mg/kg | Freq: Once | INTRAVENOUS | Status: AC
  Administered 2021-02-23: 12:00:00 300 mg via INTRAVENOUS
  Filled 2021-02-23: qty 12

## 2021-02-23 MED ORDER — OXYCODONE HCL 5 MG PO TABS: 5.0000 mg | ORAL_TABLET | Freq: Three times a day (TID) | ORAL | 0 refills | Status: DC | PRN

## 2021-02-23 MED ORDER — SODIUM CHLORIDE 0.9% FLUSH: 10.0000 mL | INTRAVENOUS | Status: DC | PRN

## 2021-02-23 MED ORDER — LEUCOVORIN CALCIUM INJECTION 350 MG
400.0000 mg/m2 | Freq: Once | INTRAVENOUS | Status: AC
  Administered 2021-02-23: 13:00:00 680 mg via INTRAVENOUS
  Filled 2021-02-23: qty 34

## 2021-02-23 MED ORDER — PALONOSETRON HCL INJECTION 0.25 MG/5ML
0.2500 mg | Freq: Once | INTRAVENOUS | Status: AC
  Administered 2021-02-23: 11:00:00 0.25 mg via INTRAVENOUS
  Filled 2021-02-23: qty 5

## 2021-02-23 MED ORDER — SODIUM CHLORIDE 0.9 % IV SOLN: Freq: Once | INTRAVENOUS | Status: AC

## 2021-02-23 MED ORDER — OXALIPLATIN CHEMO INJECTION 100 MG/20ML
85.0000 mg/m2 | Freq: Once | INTRAVENOUS | Status: AC
  Administered 2021-02-23: 13:00:00 145 mg via INTRAVENOUS
  Filled 2021-02-23: qty 20

## 2021-02-23 MED ORDER — DEXTROSE 5 % IV SOLN: Freq: Once | INTRAVENOUS | Status: AC

## 2021-02-23 MED ORDER — SODIUM CHLORIDE 0.9% FLUSH
10.0000 mL | Freq: Once | INTRAVENOUS | Status: AC
  Administered 2021-02-23: 10:00:00 10 mL

## 2021-02-23 MED ORDER — SODIUM CHLORIDE 0.9 % IV SOLN
2400.0000 mg/m2 | INTRAVENOUS | Status: DC
  Administered 2021-02-23: 15:00:00 4100 mg via INTRAVENOUS
  Filled 2021-02-23: qty 82

## 2021-02-23 MED ORDER — FLUOROURACIL CHEMO INJECTION 2.5 GM/50ML
400.0000 mg/m2 | Freq: Once | INTRAVENOUS | Status: AC
  Administered 2021-02-23: 15:00:00 700 mg via INTRAVENOUS
  Filled 2021-02-23: qty 14

## 2021-02-23 MED ORDER — SODIUM CHLORIDE 0.9 % IV SOLN
10.0000 mg | Freq: Once | INTRAVENOUS | Status: AC
  Administered 2021-02-23: 11:00:00 10 mg via INTRAVENOUS
  Filled 2021-02-23: qty 10

## 2021-02-23 NOTE — Progress Notes (Signed)
Nutrition follow-up completed with patient during infusion for metastatic colon cancer. Weight decreased and documented as 136 pounds December 29, down from 140.3 pounds December 15.  Patient reports taste alterations continue.   She reports constipation and inadequate food intake.   She has been drinking Premier protein.  Nutrition diagnosis: Unintended weight loss continues.  Intervention: Reviewed strategies for increasing calories and protein in small frequent meals and snacks.  Reviewed high-protein foods and provided additional nutrition fact sheets. Educated patient on strategies for improving taste alterations. Reviewed strategies for improving constipation including taking medications as prescribed by MD.  Suggested patient add prune juice every day. Provided nutrition facts sheets. Encouraged increased water intake. Recommended patient drink Ensure complete in place of Premier protein for the additional calories provided.  Provided sample and coupons.  Patient agreeable.  Monitoring, evaluation, goals: Patient will tolerate increased calories and protein to minimize further weight loss.  Next visit: To be scheduled with upcoming treatments.  Patient has RD contact information.  **Disclaimer: This note was dictated with voice recognition software. Similar sounding words can inadvertently be transcribed and this note may contain transcription errors which may not have been corrected upon publication of note.**

## 2021-02-23 NOTE — Patient Instructions (Signed)
[facility]  Discharge Instructions: Thank you for choosing [facility] to provide your oncology and hematology care.   If you have a lab appointment with the [facility], please go directly to the [facility] and check in at the registration area.   Wear comfortable clothing and clothing appropriate for easy access to any Portacath or PICC line.   We strive to give you quality time with your provider. You may need to reschedule your appointment if you arrive late (15 or more minutes).  Arriving late affects you and other patients whose appointments are after yours.  Also, if you miss three or more appointments without notifying the office, you may be dismissed from the clinic at the providers discretion.      For prescription refill requests, have your pharmacy contact our office and allow 72 hours for refills to be completed.    Today you received the following chemotherapy and/or immunotherapy agents: avastin/oxaliplatin/leucovorin/5FU      To help prevent nausea and vomiting after your treatment, we encourage you to take your nausea medication as directed.  BELOW ARE SYMPTOMS THAT SHOULD BE REPORTED IMMEDIATELY: *FEVER GREATER THAN 100.4 F (38 C) OR HIGHER *CHILLS OR SWEATING *NAUSEA AND VOMITING THAT IS NOT CONTROLLED WITH YOUR NAUSEA MEDICATION *UNUSUAL SHORTNESS OF BREATH *UNUSUAL BRUISING OR BLEEDING *URINARY PROBLEMS (pain or burning when urinating, or frequent urination) *BOWEL PROBLEMS (unusual diarrhea, constipation, pain near the anus) TENDERNESS IN MOUTH AND THROAT WITH OR WITHOUT PRESENCE OF ULCERS (sore throat, sores in mouth, or a toothache) UNUSUAL RASH, SWELLING OR PAIN  UNUSUAL VAGINAL DISCHARGE OR ITCHING   Items with * indicate a potential emergency and should be followed up as soon as possible or go to the Emergency Department if any problems should occur.  Please show the CHEMOTHERAPY ALERT CARD or IMMUNOTHERAPY  ALERT CARD at check-in to the Emergency Department and triage nurse.  Should you have questions after your visit or need to cancel or reschedule your appointment, please contact [facility]  Dept: [phone]  and follow the prompts.  Office hours are 8:00 a.m. to 4:30 p.m. Monday - Friday. Please note that voicemails left after 4:00 p.m. may not be returned until the following business day.  We are closed weekends and major holidays. You have access to a nurse at all times for urgent questions. Please call the main number to the clinic Dept: [phone] and follow the prompts.   For any non-urgent questions, you may also contact your provider using MyChart. We now offer e-Visits for anyone 25 and older to request care online for non-urgent symptoms. For details visit [email or web address].   Also download the MyChart app! Go to the app store, search "MyChart", open the app, select [facility], and log in with your MyChart username and password.  Due to Covid, a mask is required upon entering the hospital/clinic. If you do not have a mask, one will be given to you upon arrival. For doctor visits, patients may have 1 support person aged 64 or older with them. For treatment visits, patients cannot have anyone with them due to current Covid guidelines and our immunocompromised population.

## 2021-02-23 NOTE — Progress Notes (Signed)
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    2) 12/28/2020: CT abdomen/pelvis: Short segment colonic wall thickening at the hepatic flexure with pericolonic stranding and nodularity, concerning for primary colonic neoplasm. Recommend correlation with colonoscopy.  Findings of metastatic disease including multifocal omental, mesenteric, and peritoneal/retroperitoneal soft tissue implants, portocaval and retroperitoneal lymphadenopathy and multiple hepatic metastases. Question lucent lesion involving the S2 vertebral body with soft tissue density extending into the adjacent canal and partially expanded left S2 neural foramina. Recommend dedicated MRI of the sacrum for further characterization. Subpleural 0.7 cm nodule in the lingula is indeterminate. Further evaluation with dedicated CT chest is recommended.  A 1.0 cm lesion in the pancreatic neck without pancreatic ductal dilatation. This could be further characterized with MRI of the abdomen.   3) 01/04/2021: Establish care at [facility].   4) 01/06/2021: Underwent colonoscopy with Dr. Kathreen Devoid [facility]).  Findings revealed a circumferential, near obstructing colon mass concerning for malignancy.  Pathology revealed superficial fragments of colonic mucosa with extensive high-grade dysplasia.  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  No suspicious osseous lesion.  6) 01/11/2021: CT chest: Multiple small pulmonary densities in the bilateral lower lobes of indeterminate etiology.  Ill-defined hypodense lesion in the liver measuring at least 5.5  x 4.0 cm as well as few small hypodense lesions concerning for malignant process.  No mediastinal lymphadenopathy.  No suspicious osseous lesions  7) 01/17/2021: Underwent port placement and liver biopsy.  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  8) 01/26/2021: intended start of FOLFOX +Bevacizumab chemotherapy. Held due to Bilirubin 9.0, AST 252, ALT 282. Admitted to [facility] hospital.   9) 02/09/2021: Cycle 1 Day 1 of FOLFOX + Bevacizumab   10) 02/23/2021: Cycle 2 Day 1 of FOLFOX + Bevacizumab   Interval History:  Norma Morales returns for follow-up for metastatic colon cancer.  Her last visit was on 02/09/2021 for cycle 1 of FOLFOX.  She presents today for cycle 2 of treatment.  On exam today, Norma Morales is accompanied by her husband.  She notes she continues to struggle with constipation.  She is taking her oxycodone 5 mg every 8 hours.  She notes that her pain is well controlled right now with 0 out of 10 in severity.  She notes also her stomach feels good she was able to have a bowel movement earlier this week.  She is been having some difficulty with appetite as her food tastes poor.  She is not having any nausea or vomiting.  She does endorse having cold sensitivity and did have an episode where she forgot and drank some cold water and cause esophageal spasm.  Her energy levels have been so-so.  She denies any fevers, chills, nausea, vomiting, or diarrhea.  A full 10 point ROS is listed below.  She is willing and able to proceed with chemotherapy treatment today.  MEDICAL HISTORY:  Past Medical History:  Diagnosis Date   Cancer [facility])    colon   Hyperlipidemia     SURGICAL HISTORY: Past Surgical History:  Procedure Laterality Date   BILIARY STENT PLACEMENT N/A 01/27/2021   Procedure: BILIARY STENT PLACEMENT;  Surgeon: Clarene Essex, MD;  Location: [facility];  Service: Endoscopy;  Laterality: N/A;   ERCP N/A 01/27/2021   Procedure: ENDOSCOPIC  RETROGRADE CHOLANGIOPANCREATOGRAPHY (ERCP);  Surgeon: Clarene Essex, MD;  Location: [facility];  Service: Endoscopy;  Laterality: N/A;   IR IMAGING GUIDED PORT INSERTION  01/17/2021   IR US GUIDE BX ASP/DRAIN  01/17/2021   SPHINCTEROTOMY  01/27/2021   Procedure: Joan Mayans;  Surgeon: Clarene Essex, MD;  Location: [facility];  Service: Endoscopy;;   TONSILLECTOMY      SOCIAL HISTORY: Social History   Socioeconomic History   Marital status: Married    Spouse name: Not on file   Number of children: Not on file   Years of education: Not on file   Highest education level: Not on file  Occupational History   Not on file  Tobacco Use   Smoking status: Never   Smokeless tobacco: Never  Substance and Sexual Activity   Alcohol use: Never   Drug use: Never   Sexual activity: Not on file  Other Topics Concern   Not on file  Social History Narrative   Not on file   Social Determinants of Health   Financial Resource Strain: Not on file  Food Insecurity: Not on file  Transportation Needs: Not on file  Physical Activity: Not on file  Stress: Not on file  Social Connections: Not on file  Intimate Partner Violence: Not on file    FAMILY HISTORY: No family history on file.  ALLERGIES:  is allergic to aspirin.  MEDICATIONS:  Current Outpatient Medications  Medication Sig Dispense Refill   alendronate (FOSAMAX) 70 MG tablet Take 70 mg by mouth every Sunday.     atenolol (TENORMIN) 25 MG tablet Take 25 mg by mouth at bedtime.     benzonatate (TESSALON) 100 MG capsule Take 1 capsule (100 mg total) by mouth 3 (three) times daily. 20 capsule 0   ezetimibe (ZETIA) 10 MG tablet Take 10 mg by mouth at bedtime.     NON FORMULARY Take 30 mLs by mouth See admin instructions. Source of Life Gold Multivitamin Liquid-/Healthy Immune System & Well-Being (includes Vitamins D3, B12, K2 & Over 120 Whole Food Nutrients)- Drink 30 ml's by mouth once a day     ondansetron (ZOFRAN) 8 MG tablet Take 1  tablet (8 mg total) by mouth every 8 (eight) hours as needed for nausea or vomiting. 60 tablet 3   oxyCODONE (ROXICODONE) 5 MG immediate release tablet Take 1 tablet (5 mg total) by mouth every 8 (eight) hours as needed. (Patient taking differently: Take 5 mg by mouth every 8 (eight) hours as needed (for pain).) 90 tablet 0   prochlorperazine (COMPAZINE) 10 MG tablet Take 1 tablet (10 mg total) by mouth every 6 (six) hours as needed for nausea or vomiting. 60 tablet 3   senna (SENOKOT) 8.6 MG TABS tablet Take 1 tablet (8.6 mg total) by mouth daily as needed for mild constipation. 30 tablet 0   No current facility-administered medications for this visit.    REVIEW OF SYSTEMS:   Constitutional: ( - ) fevers, ( - )  chills , ( - ) night sweats Eyes: ( - ) blurriness of vision, ( - ) double vision, ( - ) watery eyes Ears, nose, mouth, throat, and face: ( - ) mucositis, ( - ) sore throat Respiratory: ( - ) cough, ( - ) dyspnea, ( - ) wheezes Cardiovascular: ( - ) palpitation, ( - ) chest discomfort, ( - )  lower extremity swelling Gastrointestinal:  ( - ) nausea, ( - ) heartburn, ( - ) change in bowel habits Skin: ( - ) abnormal skin rashes Lymphatics: ( - ) new lymphadenopathy, ( - ) easy bruising Neurological: ( - ) numbness, ( - ) tingling, ( - ) new weaknesses Behavioral/Psych: ( - ) mood change, ( - ) new changes  All other systems were reviewed with the patient and are negative.  PHYSICAL EXAMINATION: ECOG PERFORMANCE STATUS: 1 - Symptomatic but completely ambulatory  Vitals:   02/23/21 0955  BP: (!) 119/59  Pulse: 70  Resp: 17  Temp: 97.7 F (36.5 C)  SpO2: 97%    Filed Weights   02/23/21 0955  Weight: 136 lb 14.4 oz (62.1 kg)     GENERAL: mildly jaundiced appearing Caucasian female in NAD  SKIN:  rashes or significant lesions. Jaundice.  EYES: conjunctiva are pink and non-injected, sclera icteric LUNGS: clear to auscultation and percussion with normal breathing  effort HEART: regular rate & rhythm and no murmurs and no lower extremity edema Musculoskeletal: no cyanosis of digits and no clubbing  PSYCH: alert & oriented x 3, fluent speech NEURO: no focal motor/sensory deficits  LABORATORY DATA:  I have reviewed the data as listed CBC Latest Ref Rng & Units 02/23/2021 02/09/2021 01/28/2021  WBC 4.0 - 10.5 K/uL 4.6 14.1(H) 12.0(H)  Hemoglobin 12.0 - 15.0 g/dL 8.9(L) 9.6(L) 9.0(L)  Hematocrit 36.0 - 46.0 % 27.4(L) 29.4(L) 28.5(L)  Platelets 150 - 400 K/uL 406(H) 467(H) 470(H)    CMP Latest Ref Rng & Units 02/23/2021 02/09/2021 01/28/2021  Glucose 70 - 99 mg/dL 106(H) 127(H) 120(H)  BUN 8 - 23 mg/dL _0 Creatinine 0.44 - 1.00 mg/dL 0.45 0.61 0.50  Sodium 135 - 145 mmol/L 132(L) 131(L) 134(L)  Potassium 3.5 - 5.1 mmol/L 4.2 4.0 3.9  Chloride 98 - 111 mmol/L 99 98 101  CO2 22 - 32 mmol/L _1 Calcium 8.9 - 10.3 mg/dL 8.8(L) 8.6(L) 8.1(L)  Total Protein 6.5 - 8.1 g/dL 6.4(L) 6.7 5.8(L)  Total Bilirubin 0.3 - 1.2 mg/dL 1.0 1.7(H) 4.1(H)  Alkaline Phos 38 - 126 U/L 317(H) 420(H) 430(H)  AST 15 - 41 U/L 47(H) 68(H) 114(H)  ALT 0 - 44 U/L 35 49(H) 156(H)    PATHOLOGY: SURGICAL PATHOLOGY  CASE: [id number]  A. LIVER, RIGHT, MASS, BIOPSY:  - Metastatic adenocarcinoma consistent with colorectal origin.   COMMENT:  Sections demonstrate metastatic moderately differentiated adenocarcinoma  with dirty necrosis and extracellular mucin involving hepatic  parenchyma. A limited panel of immunohistochemical stains was performed  and the adenocarcinoma is positive for cytokeratin 20 and CDX-2 while  negative for cytokeratin 7. The morphologic findings in conjunction with  the pattern of immunohistochemical staining is consistent with  metastatic adenocarcinoma of colorectal origin.   RADIOGRAPHIC STUDIES: I have personally reviewed the radiological images as listed and agreed with the findings in the report. DG Chest 2 View  Result Date:  01/26/2021 CLINICAL DATA:  Cough for 2 days. EXAM: CHEST - 2 VIEW COMPARISON:  CT chest 01/11/2021. CT abdomen and pelvis 01/18/2021. FINDINGS: Linear nodular densities in the left lung base are unchanged. There is no focal lung consolidation, pleural effusion or pneumothorax. The heart is normal in size. Right chest port catheter tip projects over the mid SVC. No acute fractures are identified. IMPRESSION: 1. Stable nodular and linear densities in the left lung base. 2. No new focal lung infiltrate. Electronically Signed   By: Warren Lacy  Dagoberto Reef M.D.   On: 01/26/2021 22:18   DG ERCP WITH SPHINCTEROTOMY  Result Date: 01/27/2021 CLINICAL DATA:  ERCP with sphincterotomy and stent placement. EXAM: ERCP TECHNIQUE: Multiple spot images obtained with the fluoroscopic device and submitted for interpretation post-procedure. FLUOROSCOPY TIME:  4 minutes, 51 seconds COMPARISON:  MRCP-01/19/2021 FINDINGS: 14 spot intraoperative fluoroscopic images of the right upper abdominal quadrant during ERCP are provided for review. Initial image demonstrates an ERCP probe overlying the right upper abdominal quadrant Subsequent images demonstrate opacification of the central aspect of the biliary tree which appears markedly dilated with non opacification and apparent malignant occlusion involving the majority of the CBD. Completion images demonstrates placement of an internal biliary stent traversing the CBD with residual nonocclusive narrowing at its mid aspect. IMPRESSION: ERCP with biliary stenting as above. These images were submitted for radiologic interpretation only. Please see the procedural report for the amount of contrast and the fluoroscopy time utilized. Electronically Signed   By: Sandi Mariscal M.D.   On: 01/27/2021 14:17   ECHOCARDIOGRAM COMPLETE  Result Date: 01/28/2021    ECHOCARDIOGRAM REPORT   Patient Name:   Norma Morales Date of Exam: 01/28/2021 Medical Rec #:  [id number]    Height:       63.0 in Accession #:     [id number]   Weight:       138.2 lb Date of Birth:  1945/11/20    BSA:          1.653 m Patient Age:    75 years     BP:           135/73 mmHg Patient Gender: F            HR:           65 bpm. Exam Location:  Inpatient Procedure: 2D Echo, Cardiac Doppler and Color Doppler Indications:    Tachycardia [[id number]]  History:        Patient has no prior history of Echocardiogram examinations.                 Risk Factors:Dyslipidemia.  Sonographer:    Bernadene Person RDCS Referring Phys: [id number] Leeper  1. Left ventricular ejection fraction, by estimation, is 65 to 70%. The left ventricle has normal function. The left ventricle has no regional wall motion abnormalities. Left ventricular diastolic parameters were normal.  2. Right ventricular systolic function is normal. The right ventricular size is normal. There is normal pulmonary artery systolic pressure.  3. Left atrial size was mildly dilated.  4. Right atrial size was mildly dilated.  5. The mitral valve is normal in structure. Trivial mitral valve regurgitation. No evidence of mitral stenosis.  6. The aortic valve is normal in structure. Aortic valve regurgitation is not visualized. No aortic stenosis is present. FINDINGS  Left Ventricle: Left ventricular ejection fraction, by estimation, is 65 to 70%. The left ventricle has normal function. The left ventricle has no regional wall motion abnormalities. The left ventricular internal cavity size was normal in size. There is  no left ventricular hypertrophy. Left ventricular diastolic parameters were normal. Right Ventricle: The right ventricular size is normal. Right vetricular wall thickness was not well visualized. Right ventricular systolic function is normal. There is normal pulmonary artery systolic pressure. The tricuspid regurgitant velocity is 2.33 m/s, and with an assumed right atrial pressure of 3 mmHg, the estimated right ventricular systolic pressure is 84.1 mmHg. Left Atrium: Left  atrial size was mildly dilated. Right Atrium:  Right atrial size was mildly dilated. Pericardium: There is no evidence of pericardial effusion. Mitral Valve: The mitral valve is normal in structure. Trivial mitral valve regurgitation. No evidence of mitral valve stenosis. Tricuspid Valve: The tricuspid valve is grossly normal. Tricuspid valve regurgitation is not demonstrated. Aortic Valve: The aortic valve is normal in structure. Aortic valve regurgitation is not visualized. No aortic stenosis is present. Pulmonic Valve: The pulmonic valve was grossly normal. Pulmonic valve regurgitation is trivial. Aorta: The aortic root and ascending aorta are structurally normal, with no evidence of dilitation. IAS/Shunts: The interatrial septum was not well visualized.  LEFT VENTRICLE PLAX 2D LVIDd:         4.10 cm   Diastology LVIDs:         2.30 cm   LV e' medial:    10.10 cm/s LV PW:         0.80 cm   LV E/e' medial:  8.6 LV IVS:        0.80 cm   LV e' lateral:   9.68 cm/s LVOT diam:     1.90 cm   LV E/e' lateral: 9.0 LV SV:         59 LV SV Index:   36 LVOT Area:     2.84 cm  RIGHT VENTRICLE RV S prime:     9.60 cm/s TAPSE (M-mode): 1.7 cm LEFT ATRIUM             Index        RIGHT ATRIUM           Index LA diam:        3.40 cm 2.06 cm/m   RA Area:     18.50 cm LA Vol (A2C):   67.9 ml 41.08 ml/m  RA Volume:   52.30 ml  31.65 ml/m LA Vol (A4C):   52.4 ml 31.71 ml/m LA Biplane Vol: 63.2 ml 38.24 ml/m  AORTIC VALVE             PULMONIC VALVE LVOT Vmax:   94.60 cm/s  PR End Diast Vel: 4.33 msec LVOT Vmean:  61.100 cm/s LVOT VTI:    0.208 m  AORTA Ao Root diam: 3.30 cm Ao Asc diam:  3.10 cm MITRAL VALVE               TRICUSPID VALVE MV Area (PHT): 4.39 cm    TR Peak grad:   21.7 mmHg MV Decel Time: 173 msec    TR Vmax:        233.00 cm/s MV E velocity: 87.30 cm/s MV A velocity: 53.80 cm/s  SHUNTS MV E/A ratio:  1.62        Systemic VTI:  0.21 m                            Systemic Diam: 1.90 cm Mertie Moores MD  Electronically signed by Mertie Moores MD Signature Date/Time: 01/28/2021/3:09:20 PM    Final     ASSESSMENT & PLAN Norma Morales is a 75 y.o. 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  She presents to start chemotherapy.   The mainstay treatment will be chemotherapy with the rationale to prolong life and help improve cancer related symptoms.  This is unfortunately not a curable disease.  The proposed treatment regimen includes infusional 5-FU, oxaliplatin and bevacizumab given once every 2 weeks.  The dose, frequency and  common side effects were discussed with the patient.  Common side effects include fatigue, appetite loss, nausea, diarrhea, cold sensitivity, neuropathy, poor wound healing and increased risk of bleeding.  We will plan to obtain repeat imaging in 2 to 3 months to assess treatment response.  Additionally, we have additional biomarkers including KRAS WT, NRAS WT, BRAF mutation positive and MSS. Patient is scheduled for chemoeducation on 01/25/2021 and tenative start date for chemotherapy on 02/09/2021.    # Elevated Bilirubin/LFTs, improving -- Patient was admitted after last visit 2 weeks ago and underwent ERCP with biliary stent placement. --Solid improvement in her LFTs.  AST today 35, ALT 47, and total bilirubin 1.0.   --Okay to proceed with chemotherapy today.  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 --today is Cycle 2 of FOLFOX plus Bevacizumab --RTC in 2 weeks for continued FOLFOX chemotherapy.  #RUQ abdominal pain: --Likely secondary to liver metastases --Currently on oxycodone 5 mg q 8 hours with improvement  of pain. Refill sent.  --Advised patient to be aggressive with her bowel regimen to minimize constipation.   #Supportive Care -- chemotherapy education complete -- port placed -- zofran 73m q8H PRN and compazine 160mPO q6H for nausea -- EMLA cream for port -- oxycodone 67m62m q8H PRN for pain control (as above)   No orders of the defined types were placed in this encounter.   All questions were answered. The patient knows to call the clinic with any problems, questions or concerns.  I have spent a total of 30 minutes minutes of face-to-face and non-face-to-face time, preparing to see the patient, obtaining and/or reviewing separately obtained history, performing a medically appropriate examination, counseling and educating the patient, ordering medications, referring and communicating with other health care professionals, documenting clinical information in the electronic health record, and care coordination.   JohLedell PeoplesD Department of Hematology/Oncology [facility] Medical Corpone: [phone]: [phone]: [email or web address]

## 2021-02-25 ENCOUNTER — Inpatient Hospital Stay: Payer: Medicare Other

## 2021-02-25 ENCOUNTER — Other Ambulatory Visit: Payer: Self-pay

## 2021-02-25 VITALS — BP 133/61 | HR 79 | Temp 98.4°F | Resp 17

## 2021-02-25 DIAGNOSIS — Z5112 Encounter for antineoplastic immunotherapy: Secondary | ICD-10-CM | POA: Diagnosis not present

## 2021-02-25 DIAGNOSIS — C799 Secondary malignant neoplasm of unspecified site: Secondary | ICD-10-CM

## 2021-02-25 MED ORDER — HEPARIN SOD (PORK) LOCK FLUSH 100 UNIT/ML IV SOLN
500.0000 [IU] | Freq: Once | INTRAVENOUS | Status: AC | PRN
  Administered 2021-02-25: 500 [IU]

## 2021-02-25 MED ORDER — SODIUM CHLORIDE 0.9% FLUSH
10.0000 mL | INTRAVENOUS | Status: DC | PRN
  Administered 2021-02-25: 10 mL

## 2021-03-09 ENCOUNTER — Inpatient Hospital Stay: Payer: Medicare Other | Attending: Physician Assistant | Admitting: Hematology and Oncology

## 2021-03-09 ENCOUNTER — Inpatient Hospital Stay: Payer: Medicare Other | Admitting: Dietician

## 2021-03-09 ENCOUNTER — Inpatient Hospital Stay: Payer: Medicare Other

## 2021-03-09 ENCOUNTER — Other Ambulatory Visit: Payer: Self-pay

## 2021-03-09 ENCOUNTER — Other Ambulatory Visit: Payer: Medicare Other

## 2021-03-09 ENCOUNTER — Ambulatory Visit: Payer: Medicare Other

## 2021-03-09 VITALS — BP 122/59 | HR 74 | Temp 98.1°F | Resp 17 | Wt 135.4 lb

## 2021-03-09 DIAGNOSIS — R160 Hepatomegaly, not elsewhere classified: Secondary | ICD-10-CM | POA: Diagnosis not present

## 2021-03-09 DIAGNOSIS — C786 Secondary malignant neoplasm of retroperitoneum and peritoneum: Secondary | ICD-10-CM | POA: Diagnosis not present

## 2021-03-09 DIAGNOSIS — Z5189 Encounter for other specified aftercare: Secondary | ICD-10-CM | POA: Insufficient documentation

## 2021-03-09 DIAGNOSIS — R1011 Right upper quadrant pain: Secondary | ICD-10-CM | POA: Insufficient documentation

## 2021-03-09 DIAGNOSIS — Z79899 Other long term (current) drug therapy: Secondary | ICD-10-CM | POA: Diagnosis not present

## 2021-03-09 DIAGNOSIS — C189 Malignant neoplasm of colon, unspecified: Secondary | ICD-10-CM

## 2021-03-09 DIAGNOSIS — Z5112 Encounter for antineoplastic immunotherapy: Secondary | ICD-10-CM | POA: Diagnosis not present

## 2021-03-09 DIAGNOSIS — Z95828 Presence of other vascular implants and grafts: Secondary | ICD-10-CM

## 2021-03-09 DIAGNOSIS — Z5111 Encounter for antineoplastic chemotherapy: Secondary | ICD-10-CM | POA: Diagnosis present

## 2021-03-09 DIAGNOSIS — C799 Secondary malignant neoplasm of unspecified site: Secondary | ICD-10-CM

## 2021-03-09 DIAGNOSIS — R17 Unspecified jaundice: Secondary | ICD-10-CM

## 2021-03-09 LAB — CBC WITH DIFFERENTIAL (CANCER CENTER ONLY)
Abs Immature Granulocytes: 0 10*3/uL (ref 0.00–0.07)
Basophils Absolute: 0.1 10*3/uL (ref 0.0–0.1)
Basophils Relative: 2 %
Eosinophils Absolute: 0.1 10*3/uL (ref 0.0–0.5)
Eosinophils Relative: 2 %
HCT: 27.6 % — ABNORMAL LOW (ref 36.0–46.0)
Hemoglobin: 8.7 g/dL — ABNORMAL LOW (ref 12.0–15.0)
Immature Granulocytes: 0 %
Lymphocytes Relative: 27 %
Lymphs Abs: 0.7 10*3/uL (ref 0.7–4.0)
MCH: 24 pg — ABNORMAL LOW (ref 26.0–34.0)
MCHC: 31.5 g/dL (ref 30.0–36.0)
MCV: 76 fL — ABNORMAL LOW (ref 80.0–100.0)
Monocytes Absolute: 0.8 10*3/uL (ref 0.1–1.0)
Monocytes Relative: 29 %
Neutro Abs: 1.1 10*3/uL — ABNORMAL LOW (ref 1.7–7.7)
Neutrophils Relative %: 40 %
Platelet Count: 304 10*3/uL (ref 150–400)
RBC: 3.63 MIL/uL — ABNORMAL LOW (ref 3.87–5.11)
RDW: 15.9 % — ABNORMAL HIGH (ref 11.5–15.5)
WBC Count: 2.7 10*3/uL — ABNORMAL LOW (ref 4.0–10.5)
nRBC: 0 % (ref 0.0–0.2)

## 2021-03-09 LAB — CMP (CANCER CENTER ONLY)
ALT: 18 U/L (ref 0–44)
AST: 23 U/L (ref 15–41)
Albumin: 3.1 g/dL — ABNORMAL LOW (ref 3.5–5.0)
Alkaline Phosphatase: 195 U/L — ABNORMAL HIGH (ref 38–126)
Anion gap: 7 (ref 5–15)
BUN: 11 mg/dL (ref 8–23)
CO2: 28 mmol/L (ref 22–32)
Calcium: 8.8 mg/dL — ABNORMAL LOW (ref 8.9–10.3)
Chloride: 98 mmol/L (ref 98–111)
Creatinine: 0.52 mg/dL (ref 0.44–1.00)
GFR, Estimated: >60 mL/min (ref >60)
Glucose, Bld: 129 mg/dL — ABNORMAL HIGH (ref 70–99)
Potassium: 3.8 mmol/L (ref 3.5–5.1)
Sodium: 133 mmol/L — ABNORMAL LOW (ref 135–145)
Total Bilirubin: 0.8 mg/dL (ref 0.3–1.2)
Total Protein: 6.3 g/dL — ABNORMAL LOW (ref 6.5–8.1)

## 2021-03-09 MED ORDER — SODIUM CHLORIDE 0.9 % IV SOLN
2400.0000 mg/m2 | INTRAVENOUS | Status: DC
  Administered 2021-03-09: 4100 mg via INTRAVENOUS
  Filled 2021-03-09: qty 82

## 2021-03-09 MED ORDER — FLUOROURACIL CHEMO INJECTION 2.5 GM/50ML
400.0000 mg/m2 | Freq: Once | INTRAVENOUS | Status: AC
  Administered 2021-03-09: 700 mg via INTRAVENOUS
  Filled 2021-03-09: qty 14

## 2021-03-09 MED ORDER — SODIUM CHLORIDE 0.9 % IV SOLN
10.0000 mg | Freq: Once | INTRAVENOUS | Status: AC
  Administered 2021-03-09: 10 mg via INTRAVENOUS
  Filled 2021-03-09: qty 10

## 2021-03-09 MED ORDER — SODIUM CHLORIDE 0.9 % IV SOLN: Freq: Once | INTRAVENOUS | Status: AC

## 2021-03-09 MED ORDER — PALONOSETRON HCL INJECTION 0.25 MG/5ML
0.2500 mg | Freq: Once | INTRAVENOUS | Status: AC
  Administered 2021-03-09: 0.25 mg via INTRAVENOUS

## 2021-03-09 MED ORDER — LEUCOVORIN CALCIUM INJECTION 350 MG
400.0000 mg/m2 | Freq: Once | INTRAVENOUS | Status: AC
  Administered 2021-03-09: 680 mg via INTRAVENOUS
  Filled 2021-03-09: qty 34

## 2021-03-09 MED ORDER — DEXTROSE 5 % IV SOLN: Freq: Once | INTRAVENOUS | Status: AC

## 2021-03-09 MED ORDER — SODIUM CHLORIDE 0.9% FLUSH
10.0000 mL | Freq: Once | INTRAVENOUS | Status: AC
  Administered 2021-03-09: 10 mL

## 2021-03-09 MED ORDER — SODIUM CHLORIDE 0.9 % IV SOLN
4.9000 mg/kg | Freq: Once | INTRAVENOUS | Status: AC
  Administered 2021-03-09: 300 mg via INTRAVENOUS
  Filled 2021-03-09: qty 12

## 2021-03-09 MED ORDER — OXALIPLATIN CHEMO INJECTION 100 MG/20ML
85.0000 mg/m2 | Freq: Once | INTRAVENOUS | Status: AC
  Administered 2021-03-09: 145 mg via INTRAVENOUS
  Filled 2021-03-09: qty 20

## 2021-03-09 NOTE — Progress Notes (Signed)
Nutrition Follow-up:  Patient receiving FOLFOX + Bevacizumab for metastatic colon cancer.   Met with patient during infusion. She reports trying to eat better. Patient eating smaller meals with good sources of protein (eggs, bacon, pancakes, mac/cheese, homemade applesauce, mashed potatoes, gravy) Patient reports she has been drinking one Ensure Complete and one Premier Protein. She is unable to drink these unless they are cold. Patient reports Ensure Complete is too thick. She is asking for alternate supplement recommendations.cold sensitivity lasting ~7 days after treatment. She tried drinking a bottled water that had been sitting outside the refrigerator for a while. This made her teeth hurt.  Patient has constipation. She is taking Senokot and prune juice. She did this for 3 days before having a BM yesterday.   Medications: reviewed   Labs: Na 133, Glucose 129  Anthropometrics: Weight 135 lb 6.4 oz today stable x 2 weeks  12/29 - 136 lb 14.4 oz 12/15 - 140 lb 4.8 oz  12/1 - 138 lb 3.7 oz   NUTRITION DIAGNOSIS: Unintended weight loss stable    INTERVENTION:  Encouraged small frequent meals with adequate calories and protein Discussed strategies for cold sensitivity and educated on food substitutions - handouts and warm supplement ideas provided Recommend pt drink 2 oral nutrition supplements daily Suggested pt might prefer Ensure Plus vs Ensure Complete - coupons provided Continue daily prune juice and taking medication as prescribed per MD Contact information provided     MONITORING, EVALUATION, GOAL: weight trends, intake    NEXT VISIT: Thursday February 9 during infusion

## 2021-03-09 NOTE — Progress Notes (Signed)
Ok to proceed with treatment today per Dr. Lorenso Courier with ANC of 1.1

## 2021-03-09 NOTE — Progress Notes (Signed)
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    2) 12/28/2020: CT abdomen/pelvis: Short segment colonic wall thickening at the hepatic flexure with pericolonic stranding and nodularity, concerning for primary colonic neoplasm. Recommend correlation with colonoscopy.  Findings of metastatic disease including multifocal omental, mesenteric, and peritoneal/retroperitoneal soft tissue implants, portocaval and retroperitoneal lymphadenopathy and multiple hepatic metastases. Question lucent lesion involving the S2 vertebral body with soft tissue density extending into the adjacent canal and partially expanded left S2 neural foramina. Recommend dedicated MRI of the sacrum for further characterization. Subpleural 0.7 cm nodule in the lingula is indeterminate. Further evaluation with dedicated CT chest is recommended.  A 1.0 cm lesion in the pancreatic neck without pancreatic ductal dilatation. This could be further characterized with MRI of the abdomen.   3) 01/04/2021: Establish care at [facility].   4) 01/06/2021: Underwent colonoscopy with Dr. Kathreen Devoid [facility]).  Findings revealed a circumferential, near obstructing colon mass concerning for malignancy.  Pathology revealed superficial fragments of colonic mucosa with extensive high-grade dysplasia.  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  No suspicious osseous lesion.  6) 01/11/2021: CT chest: Multiple small pulmonary densities in the bilateral lower lobes of indeterminate etiology.  Ill-defined hypodense lesion in the liver measuring at least 5.5  x 4.0 cm as well as few small hypodense lesions concerning for malignant process.  No mediastinal lymphadenopathy.  No suspicious osseous lesions  7) 01/17/2021: Underwent port placement and liver biopsy.  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  8) 01/26/2021: intended start of FOLFOX +Bevacizumab chemotherapy. Held due to Bilirubin 9.0, AST 252, ALT 282. Admitted to [facility] hospital.   9) 02/09/2021: Cycle 1 Day 1 of FOLFOX + Bevacizumab   10) 02/23/2021: Cycle 2 Day 1 of FOLFOX + Bevacizumab   11) 03/09/2021: Cycle 3 Day 1 of FOLFOX + Bevacizumab   Interval History:  Norma Morales returns for follow-up for metastatic colon cancer.  Her last visit was on 02/23/2021 for cycle 2 of FOLFOX.  She presents today for cycle 3 of treatment.  On exam today, Norma Morales is accompanied by her husband.  She notes she has been feeling better overall in the interim since her last visit.  Her constipation has been improving though she unfortunately has been noticing some thinning of her hair.  She notes that the cold intolerance "has been tough".  She notes that she had some water out of the refrigerator and her "teeth locked up".  She notes that she has been doing her best to avoid cold liquids though unfortunately she does enjoy them.  She notes that she has been having some "sour stomach" for which she has been taking her nausea medication.  She notes that she does have some occasional episodes of pain on her right side of her abdomen.  She notes that her appetite is been "no good" in the last week and she has been doing her best to try to eat more food.  She denies any fevers, chills, nausea, vomiting, or diarrhea.  A full 10 point ROS is listed below.  She is willing and able to proceed with chemotherapy treatment today.  MEDICAL HISTORY:  Past Medical History:  Diagnosis Date   Cancer ([place])    colon   Hyperlipidemia     SURGICAL HISTORY: Past Surgical History:   Procedure Laterality Date   BILIARY STENT PLACEMENT N/A 01/27/2021   Procedure: BILIARY STENT PLACEMENT;  Surgeon: Clarene Essex, MD;  Location: [facility];  Service: Endoscopy;  Laterality: N/A;   ERCP N/A 01/27/2021   Procedure: ENDOSCOPIC RETROGRADE CHOLANGIOPANCREATOGRAPHY (ERCP);  Surgeon: Clarene Essex, MD;  Location: [facility];  Service: Endoscopy;  Laterality: N/A;   IR IMAGING GUIDED PORT INSERTION  01/17/2021   IR US GUIDE BX ASP/DRAIN  01/17/2021   SPHINCTEROTOMY  01/27/2021   Procedure: Joan Mayans;  Surgeon: Clarene Essex, MD;  Location: [facility];  Service: Endoscopy;;   TONSILLECTOMY      SOCIAL HISTORY: Social History   Socioeconomic History   Marital status: Married    Spouse name: Not on file   Number of children: Not on file   Years of education: Not on file   Highest education level: Not on file  Occupational History   Not on file  Tobacco Use   Smoking status: Never   Smokeless tobacco: Never  Substance and Sexual Activity   Alcohol use: Never   Drug use: Never   Sexual activity: Not on file  Other Topics Concern   Not on file  Social History Narrative   Not on file   Social Determinants of Health   Financial Resource Strain: Not on file  Food Insecurity: Not on file  Transportation Needs: Not on file  Physical Activity: Not on file  Stress: Not on file  Social Connections: Not on file  Intimate Partner Violence: Not on file    FAMILY HISTORY: No family history on file.  ALLERGIES:  is allergic to aspirin.  MEDICATIONS:  Current Outpatient Medications  Medication Sig Dispense Refill   alendronate (FOSAMAX) 70 MG tablet Take 70 mg by mouth every Sunday.     atenolol (TENORMIN) 25 MG tablet Take 25 mg by mouth at bedtime.     benzonatate (TESSALON) 100 MG capsule Take 1 capsule (100 mg total) by mouth 3 (three) times daily. 20 capsule 0   ezetimibe (ZETIA) 10 MG tablet Take 10 mg by mouth at bedtime.     NON FORMULARY Take 30 mLs by  mouth See admin instructions. [employer] of [employer] Gold Multivitamin Liquid-/Healthy Immune System & Well-Being (includes Vitamins D3, B12, K2 & Over 120 Whole Food Nutrients)- Drink 30 ml's by mouth once a day     ondansetron (ZOFRAN) 8 MG tablet Take 1 tablet (8 mg total) by mouth every 8 (eight) hours as needed for nausea or vomiting. 60 tablet 3   oxyCODONE (ROXICODONE) 5 MG immediate release tablet Take 1 tablet (5 mg total) by mouth every 8 (eight) hours as needed. 90 tablet 0   prochlorperazine (COMPAZINE) 10 MG tablet Take 1 tablet (10 mg total) by mouth every 6 (six) hours as needed for nausea or vomiting. 60 tablet 3   senna (SENOKOT) 8.6 MG TABS tablet Take 1 tablet (8.6 mg total) by mouth daily as needed for mild constipation. 30 tablet 0   No current facility-administered medications for this visit.    REVIEW OF SYSTEMS:   Constitutional: ( - ) fevers, ( - )  chills , ( - ) night sweats Eyes: ( - ) blurriness of vision, ( - ) double vision, ( - ) watery eyes Ears, nose, mouth, throat, and face: ( - )  mucositis, ( - ) sore throat Respiratory: ( - ) cough, ( - ) dyspnea, ( - ) wheezes Cardiovascular: ( - ) palpitation, ( - ) chest discomfort, ( - ) lower extremity swelling Gastrointestinal:  ( - ) nausea, ( - ) heartburn, ( - ) change in bowel habits Skin: ( - ) abnormal skin rashes Lymphatics: ( - ) new lymphadenopathy, ( - ) easy bruising Neurological: ( - ) numbness, ( - ) tingling, ( - ) new weaknesses Behavioral/Psych: ( - ) mood change, ( - ) new changes  All other systems were reviewed with the patient and are negative.  PHYSICAL EXAMINATION: ECOG PERFORMANCE STATUS: 1 - Symptomatic but completely ambulatory  Vitals:   03/09/21 1140  BP: (!) 122/59  Pulse: 74  Resp: 17  Temp: 98.1 F (36.7 C)  SpO2: 100%    Filed Weights   03/09/21 1140  Weight: 135 lb 6.4 oz (61.4 kg)     GENERAL: mildly jaundiced appearing Caucasian female in NAD  SKIN:  rashes or significant  lesions. Jaundice.  EYES: conjunctiva are pink and non-injected, sclera icteric LUNGS: clear to auscultation and percussion with normal breathing effort HEART: regular rate & rhythm and no murmurs and no lower extremity edema Musculoskeletal: no cyanosis of digits and no clubbing  PSYCH: alert & oriented x 3, fluent speech NEURO: no focal motor/sensory deficits  LABORATORY DATA:  I have reviewed the data as listed CBC Latest Ref Rng & Units 03/09/2021 02/23/2021 02/09/2021  WBC 4.0 - 10.5 K/uL 2.7(L) 4.6 14.1(H)  Hemoglobin 12.0 - 15.0 g/dL 8.7(L) 8.9(L) 9.6(L)  Hematocrit 36.0 - 46.0 % 27.6(L) 27.4(L) 29.4(L)  Platelets 150 - 400 K/uL 304 406(H) 467(H)    CMP Latest Ref Rng & Units 03/09/2021 02/23/2021 02/09/2021  Glucose 70 - 99 mg/dL 129(H) 106(H) 127(H)  BUN 8 - 23 mg/dL $Remove'11 11 12  'zKudYYc$ Creatinine 0.44 - 1.00 mg/dL 0.52 0.45 0.61  Sodium 135 - 145 mmol/L 133(L) 132(L) 131(L)  Potassium 3.5 - 5.1 mmol/L 3.8 4.2 4.0  Chloride 98 - 111 mmol/L 98 99 98  CO2 22 - 32 mmol/L $RemoveB'28 27 25  'BfKFYifG$ Calcium 8.9 - 10.3 mg/dL 8.8(L) 8.8(L) 8.6(L)  Total Protein 6.5 - 8.1 g/dL 6.3(L) 6.4(L) 6.7  Total Bilirubin 0.3 - 1.2 mg/dL 0.8 1.0 1.7(H)  Alkaline Phos 38 - 126 U/L 195(H) 317(H) 420(H)  AST 15 - 41 U/L 23 47(H) 68(H)  ALT 0 - 44 U/L 18 35 49(H)    PATHOLOGY: SURGICAL PATHOLOGY  CASE: [id number]  A. LIVER, RIGHT, MASS, BIOPSY:  - Metastatic adenocarcinoma consistent with colorectal origin.   COMMENT:  Sections demonstrate metastatic moderately differentiated adenocarcinoma  with dirty necrosis and extracellular mucin involving hepatic  parenchyma. A limited panel of immunohistochemical stains was performed  and the adenocarcinoma is positive for cytokeratin 20 and CDX-2 while  negative for cytokeratin 7. The morphologic findings in conjunction with  the pattern of immunohistochemical staining is consistent with  metastatic adenocarcinoma of colorectal origin.   RADIOGRAPHIC STUDIES: I have  personally reviewed the radiological images as listed and agreed with the findings in the report. No results found.  ASSESSMENT & PLAN Norma Morales is a 76 y.o. 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  She presents to start chemotherapy.   The mainstay treatment will be chemotherapy with the rationale to prolong life and help improve cancer related symptoms.  This is unfortunately not a curable disease.  The proposed treatment regimen includes infusional 5-FU, oxaliplatin and bevacizumab given once every 2 weeks.  The dose, frequency and common side effects were discussed with the patient.  Common side effects include fatigue, appetite loss, nausea, diarrhea, cold sensitivity, neuropathy, poor wound healing and increased risk of bleeding.  We will plan to obtain repeat imaging in 2 to 3 months to assess treatment response.  Additionally, we have additional biomarkers including KRAS WT, NRAS WT, BRAF mutation positive and MSS.    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 Plan: --today is Cycle 3 Day 1 of FOLFOX plus Bevacizumab --next CT scan due in Feb 2023 --RTC in 2 weeks for continued chemotherapy.  # Elevated Bilirubin/LFTs, normalized -- Patient was admitted after last visit 2 weeks ago and underwent ERCP with biliary stent placement. --Solid improvement in her LFTs.  AST today 23, ALT 18, and total bilirubin 0.8.   --Okay to proceed with chemotherapy today.  #RUQ abdominal pain: --Likely secondary to liver metastases --Currently on oxycodone 5 mg q 8 hours with improvement of pain. Refill sent.  --Advised patient to be  aggressive with her bowel regimen to minimize constipation.   #Supportive Care -- chemotherapy education complete -- port placed -- zofran 8mg  q8H PRN and compazine 10mg  PO q6H for nausea -- EMLA cream for port -- oxycodone 34m PO q8H PRN for pain control (as above)   Orders Placed This Encounter  Procedures   CT CHEST ABDOMEN PELVIS W CONTRAST    Standing Status:   Future    Standing Expiration Date:   03/13/2022    Order Specific Question:   Preferred imaging location?    Answer:   [facility]    Order Specific Question:   Is Oral Contrast requested for this exam?    Answer:   Yes, Per Radiology protocol    All questions were answered. The patient knows to call the clinic with any problems, questions or concerns.  I have spent a total of 30 minutes minutes of face-to-face and non-face-to-face time, preparing to see the patient, obtaining and/or reviewing separately obtained history, performing a medically appropriate examination, counseling and educating the patient, ordering medications, referring and communicating with other health care professionals, documenting clinical information in the electronic health record, and care coordination.   Ledell Peoples, MD Department of Hematology/Oncology [place] [facility] Phone: [phone] Pager: [phone] Email: [email or web address]

## 2021-03-09 NOTE — Patient Instructions (Signed)
[facility]  Discharge Instructions: Thank you for choosing [facility] to provide your oncology and hematology care.   If you have a lab appointment with the [facility], please go directly to the [facility] and check in at the registration area.   Wear comfortable clothing and clothing appropriate for easy access to any Portacath or PICC line.   We strive to give you quality time with your provider. You may need to reschedule your appointment if you arrive late (15 or more minutes).  Arriving late affects you and other patients whose appointments are after yours.  Also, if you miss three or more appointments without notifying the office, you may be dismissed from the clinic at the providers discretion.      For prescription refill requests, have your pharmacy contact our office and allow 72 hours for refills to be completed.    Today you received the following chemotherapy and/or immunotherapy agents: Oxaliplatin/Leucovorin/Adrucil.      To help prevent nausea and vomiting after your treatment, we encourage you to take your nausea medication as directed.  BELOW ARE SYMPTOMS THAT SHOULD BE REPORTED IMMEDIATELY: *FEVER GREATER THAN 100.4 F (38 C) OR HIGHER *CHILLS OR SWEATING *NAUSEA AND VOMITING THAT IS NOT CONTROLLED WITH YOUR NAUSEA MEDICATION *UNUSUAL SHORTNESS OF BREATH *UNUSUAL BRUISING OR BLEEDING *URINARY PROBLEMS (pain or burning when urinating, or frequent urination) *BOWEL PROBLEMS (unusual diarrhea, constipation, pain near the anus) TENDERNESS IN MOUTH AND THROAT WITH OR WITHOUT PRESENCE OF ULCERS (sore throat, sores in mouth, or a toothache) UNUSUAL RASH, SWELLING OR PAIN  UNUSUAL VAGINAL DISCHARGE OR ITCHING   Items with * indicate a potential emergency and should be followed up as soon as possible or go to the Emergency Department if any problems should occur.  Please show the CHEMOTHERAPY ALERT CARD or IMMUNOTHERAPY  ALERT CARD at check-in to the Emergency Department and triage nurse.  Should you have questions after your visit or need to cancel or reschedule your appointment, please contact [facility]  Dept: [phone]  and follow the prompts.  Office hours are 8:00 a.m. to 4:30 p.m. Monday - Friday. Please note that voicemails left after 4:00 p.m. may not be returned until the following business day.  We are closed weekends and major holidays. You have access to a nurse at all times for urgent questions. Please call the main number to the clinic Dept: [phone] and follow the prompts.   For any non-urgent questions, you may also contact your provider using MyChart. We now offer e-Visits for anyone 35 and older to request care online for non-urgent symptoms. For details visit [email or web address].   Also download the MyChart app! Go to the app store, search "MyChart", open the app, select [facility], and log in with your MyChart username and password.  Due to Covid, a mask is required upon entering the hospital/clinic. If you do not have a mask, one will be given to you upon arrival. For doctor visits, patients may have 1 support person aged 42 or older with them. For treatment visits, patients cannot have anyone with them due to current Covid guidelines and our immunocompromised population.   Oxaliplatin Injection What is this medication? OXALIPLATIN (ox AL i PLA tin) is a chemotherapy drug. It targets fast dividing cells, like cancer cells, and causes these cells to die. This medicine is used to treat cancers of the colon and rectum, and many other cancers. This medicine may  be used for other purposes; ask your health care provider or pharmacist if you have questions. COMMON BRAND NAME(S): Eloxatin What should I tell my care team before I take this medication? They need to know if you have any of these conditions: heart disease history of irregular heartbeat liver  disease low blood counts, like white cells, platelets, or red blood cells lung or breathing disease, like asthma take medicines that treat or prevent blood clots tingling of the fingers or toes, or other nerve disorder an unusual or allergic reaction to oxaliplatin, other chemotherapy, other medicines, foods, dyes, or preservatives pregnant or trying to get pregnant breast-feeding How should I use this medication? This drug is given as an infusion into a vein. It is administered in a hospital or clinic by a specially trained health care professional. Talk to your pediatrician regarding the use of this medicine in children. Special care may be needed. Overdosage: If you think you have taken too much of this medicine contact a poison control center or emergency room at once. NOTE: This medicine is only for you. Do not share this medicine with others. What if I miss a dose? It is important not to miss a dose. Call your doctor or health care professional if you are unable to keep an appointment. What may interact with this medication? Do not take this medicine with any of the following medications: cisapride dronedarone pimozide thioridazine This medicine may also interact with the following medications: aspirin and aspirin-like medicines certain medicines that treat or prevent blood clots like warfarin, apixaban, dabigatran, and rivaroxaban cisplatin cyclosporine diuretics medicines for infection like acyclovir, adefovir, amphotericin B, bacitracin, cidofovir, foscarnet, ganciclovir, gentamicin, pentamidine, vancomycin NSAIDs, medicines for pain and inflammation, like ibuprofen or naproxen other medicines that prolong the QT interval (an abnormal heart rhythm) pamidronate zoledronic acid This list may not describe all possible interactions. Give your health care provider a list of all the medicines, herbs, non-prescription drugs, or dietary supplements you use. Also tell them if you  smoke, drink alcohol, or use illegal drugs. Some items may interact with your medicine. What should I watch for while using this medication? Your condition will be monitored carefully while you are receiving this medicine. You may need blood work done while you are taking this medicine. This medicine may make you feel generally unwell. This is not uncommon as chemotherapy can affect healthy cells as well as cancer cells. Report any side effects. Continue your course of treatment even though you feel ill unless your healthcare professional tells you to stop. This medicine can make you more sensitive to cold. Do not drink cold drinks or use ice. Cover exposed skin before coming in contact with cold temperatures or cold objects. When out in cold weather wear warm clothing and cover your mouth and nose to warm the air that goes into your lungs. Tell your doctor if you get sensitive to the cold. Do not become pregnant while taking this medicine or for 9 months after stopping it. Women should inform their health care professional if they wish to become pregnant or think they might be pregnant. Men should not father a child while taking this medicine and for 6 months after stopping it. There is potential for serious side effects to an unborn child. Talk to your health care professional for more information. Do not breast-feed a child while taking this medicine or for 3 months after stopping it. This medicine has caused ovarian failure in some women. This medicine may make  it more difficult to get pregnant. Talk to your health care professional if you are concerned about your fertility. This medicine has caused decreased sperm counts in some men. This may make it more difficult to father a child. Talk to your health care professional if you are concerned about your fertility. This medicine may increase your risk of getting an infection. Call your health care professional for advice if you get a fever, chills, or  sore throat, or other symptoms of a cold or flu. Do not treat yourself. Try to avoid being around people who are sick. Avoid taking medicines that contain aspirin, acetaminophen, ibuprofen, naproxen, or ketoprofen unless instructed by your health care professional. These medicines may hide a fever. Be careful brushing or flossing your teeth or using a toothpick because you may get an infection or bleed more easily. If you have any dental work done, tell your dentist you are receiving this medicine. What side effects may I notice from receiving this medication? Side effects that you should report to your doctor or health care professional as soon as possible: allergic reactions like skin rash, itching or hives, swelling of the face, lips, or tongue breathing problems cough low blood counts - this medicine may decrease the number of white blood cells, red blood cells, and platelets. You may be at increased risk for infections and bleeding nausea, vomiting pain, redness, or irritation at site where injected pain, tingling, numbness in the hands or feet signs and symptoms of bleeding such as bloody or black, tarry stools; red or dark brown urine; spitting up blood or brown material that looks like coffee grounds; red spots on the skin; unusual bruising or bleeding from the eyes, gums, or nose signs and symptoms of a dangerous change in heartbeat or heart rhythm like chest pain; dizziness; fast, irregular heartbeat; palpitations; feeling faint or lightheaded; falls signs and symptoms of infection like fever; chills; cough; sore throat; pain or trouble passing urine signs and symptoms of liver injury like dark yellow or brown urine; general ill feeling or flu-like symptoms; light-colored stools; loss of appetite; nausea; right upper belly pain; unusually weak or tired; yellowing of the eyes or skin signs and symptoms of low red blood cells or anemia such as unusually weak or tired; feeling faint or  lightheaded; falls signs and symptoms of muscle injury like dark urine; trouble passing urine or change in the amount of urine; unusually weak or tired; muscle pain; back pain Side effects that usually do not require medical attention (report to your doctor or health care professional if they continue or are bothersome): changes in taste diarrhea gas hair loss loss of appetite mouth sores This list may not describe all possible side effects. Call your doctor for medical advice about side effects. You may report side effects to FDA at 1-800-FDA-1088. Where should I keep my medication? This drug is given in a hospital or clinic and will not be stored at home. NOTE: This sheet is a summary. It may not cover all possible information. If you have questions about this medicine, talk to your doctor, pharmacist, or health care provider.  2022 Elsevier/Gold Standard (2020-11-01 00:00:00)  Leucovorin injection What is this medication? LEUCOVORIN (loo koe VOR in) is used to prevent or treat the harmful effects of some medicines. This medicine is used to treat anemia caused by a low amount of folic acid in the body. It is also used with 5-fluorouracil (5-FU) to treat colon cancer. This medicine may be used  for other purposes; ask your health care provider or pharmacist if you have questions. What should I tell my care team before I take this medication? They need to know if you have any of these conditions: anemia from low levels of vitamin B-12 in the blood an unusual or allergic reaction to leucovorin, folic acid, other medicines, foods, dyes, or preservatives pregnant or trying to get pregnant breast-feeding How should I use this medication? This medicine is for injection into a muscle or into a vein. It is given by a health care professional in a hospital or clinic setting. Talk to your pediatrician regarding the use of this medicine in children. Special care may be needed. Overdosage: If you  think you have taken too much of this medicine contact a poison control center or emergency room at once. NOTE: This medicine is only for you. Do not share this medicine with others. What if I miss a dose? This does not apply. What may interact with this medication? capecitabine fluorouracil phenobarbital phenytoin primidone trimethoprim-sulfamethoxazole This list may not describe all possible interactions. Give your health care provider a list of all the medicines, herbs, non-prescription drugs, or dietary supplements you use. Also tell them if you smoke, drink alcohol, or use illegal drugs. Some items may interact with your medicine. What should I watch for while using this medication? Your condition will be monitored carefully while you are receiving this medicine. This medicine may increase the side effects of 5-fluorouracil, 5-FU. Tell your doctor or health care professional if you have diarrhea or mouth sores that do not get better or that get worse. What side effects may I notice from receiving this medication? Side effects that you should report to your doctor or health care professional as soon as possible: allergic reactions like skin rash, itching or hives, swelling of the face, lips, or tongue breathing problems fever, infection mouth sores unusual bleeding or bruising unusually weak or tired Side effects that usually do not require medical attention (report to your doctor or health care professional if they continue or are bothersome): constipation or diarrhea loss of appetite nausea, vomiting This list may not describe all possible side effects. Call your doctor for medical advice about side effects. You may report side effects to FDA at 1-800-FDA-1088. Where should I keep my medication? This drug is given in a hospital or clinic and will not be stored at home. NOTE: This sheet is a summary. It may not cover all possible information. If you have questions about this  medicine, talk to your doctor, pharmacist, or health care provider.  2022 Elsevier/Gold Standard (2007-08-21 00:00:00)  Fluorouracil, 5-FU injection What is this medication? FLUOROURACIL, 5-FU (flure oh YOOR a sil) is a chemotherapy drug. It slows the growth of cancer cells. This medicine is used to treat many types of cancer like breast cancer, colon or rectal cancer, pancreatic cancer, and stomach cancer. This medicine may be used for other purposes; ask your health care provider or pharmacist if you have questions. COMMON BRAND NAME(S): Adrucil What should I tell my care team before I take this medication? They need to know if you have any of these conditions: blood disorders dihydropyrimidine dehydrogenase (DPD) deficiency infection (especially a virus infection such as chickenpox, cold sores, or herpes) kidney disease liver disease malnourished, poor nutrition recent or ongoing radiation therapy an unusual or allergic reaction to fluorouracil, other chemotherapy, other medicines, foods, dyes, or preservatives pregnant or trying to get pregnant breast-feeding How should I use this  medication? This drug is given as an infusion or injection into a vein. It is administered in a hospital or clinic by a specially trained health care professional. Talk to your pediatrician regarding the use of this medicine in children. Special care may be needed. Overdosage: If you think you have taken too much of this medicine contact a poison control center or emergency room at once. NOTE: This medicine is only for you. Do not share this medicine with others. What if I miss a dose? It is important not to miss your dose. Call your doctor or health care professional if you are unable to keep an appointment. What may interact with this medication? Do not take this medicine with any of the following medications: live virus vaccines This medicine may also interact with the following medications: medicines  that treat or prevent blood clots like warfarin, enoxaparin, and dalteparin This list may not describe all possible interactions. Give your health care provider a list of all the medicines, herbs, non-prescription drugs, or dietary supplements you use. Also tell them if you smoke, drink alcohol, or use illegal drugs. Some items may interact with your medicine. What should I watch for while using this medication? Visit your doctor for checks on your progress. This drug may make you feel generally unwell. This is not uncommon, as chemotherapy can affect healthy cells as well as cancer cells. Report any side effects. Continue your course of treatment even though you feel ill unless your doctor tells you to stop. In some cases, you may be given additional medicines to help with side effects. Follow all directions for their use. Call your doctor or health care professional for advice if you get a fever, chills or sore throat, or other symptoms of a cold or flu. Do not treat yourself. This drug decreases your body's ability to fight infections. Try to avoid being around people who are sick. This medicine may increase your risk to bruise or bleed. Call your doctor or health care professional if you notice any unusual bleeding. Be careful brushing and flossing your teeth or using a toothpick because you may get an infection or bleed more easily. If you have any dental work done, tell your dentist you are receiving this medicine. Avoid taking products that contain aspirin, acetaminophen, ibuprofen, naproxen, or ketoprofen unless instructed by your doctor. These medicines may hide a fever. Do not become pregnant while taking this medicine. Women should inform their doctor if they wish to become pregnant or think they might be pregnant. There is a potential for serious side effects to an unborn child. Talk to your health care professional or pharmacist for more information. Do not breast-feed an infant while taking  this medicine. Men should inform their doctor if they wish to father a child. This medicine may lower sperm counts. Do not treat diarrhea with over the counter products. Contact your doctor if you have diarrhea that lasts more than 2 days or if it is severe and watery. This medicine can make you more sensitive to the sun. Keep out of the sun. If you cannot avoid being in the sun, wear protective clothing and use sunscreen. Do not use sun lamps or tanning beds/booths. What side effects may I notice from receiving this medication? Side effects that you should report to your doctor or health care professional as soon as possible: allergic reactions like skin rash, itching or hives, swelling of the face, lips, or tongue low blood counts - this medicine may decrease  the number of white blood cells, red blood cells and platelets. You may be at increased risk for infections and bleeding. signs of infection - fever or chills, cough, sore throat, pain or difficulty passing urine signs of decreased platelets or bleeding - bruising, pinpoint red spots on the skin, black, tarry stools, blood in the urine signs of decreased red blood cells - unusually weak or tired, fainting spells, lightheadedness breathing problems changes in vision chest pain mouth sores nausea and vomiting pain, swelling, redness at site where injected pain, tingling, numbness in the hands or feet redness, swelling, or sores on hands or feet stomach pain unusual bleeding Side effects that usually do not require medical attention (report to your doctor or health care professional if they continue or are bothersome): changes in finger or toe nails diarrhea dry or itchy skin hair loss headache loss of appetite sensitivity of eyes to the light stomach upset unusually teary eyes This list may not describe all possible side effects. Call your doctor for medical advice about side effects. You may report side effects to FDA at  1-800-FDA-1088. Where should I keep my medication? This drug is given in a hospital or clinic and will not be stored at home. NOTE: This sheet is a summary. It may not cover all possible information. If you have questions about this medicine, talk to your doctor, pharmacist, or health care provider.  2022 Elsevier/Gold Standard (2020-11-01 00:00:00)

## 2021-03-10 ENCOUNTER — Telehealth: Payer: Self-pay | Admitting: *Deleted

## 2021-03-10 NOTE — Telephone Encounter (Signed)
Received call from pt this morning. She states that she had her chemo yesterday w/o incident, went home with her 5FU pump, had dinner out after leaving cancer center. A while after returning home she states she had 3 hard sneezes and then her tongue felt like it was starting to swell. By the time she called on call service and talked to the nurse , it had started to subside. The swelling was gone within 20 minutes. She states it did not return during the night or this morning. She states she feels good today. She just wanted to let  Dr. Lorenso Courier know. Advised her to call back if it happens again, to be mindful of cold sensitivity due to her Oxaliplatin. Also advised to keep some p;o benadryl at her home for any possible allergic reactions in the future.  She voiced understanding and she also voiced knowing when she should call 911.  Dr. Lorenso Courier made aware.

## 2021-03-11 ENCOUNTER — Inpatient Hospital Stay: Payer: Medicare Other

## 2021-03-11 ENCOUNTER — Other Ambulatory Visit: Payer: Self-pay

## 2021-03-11 VITALS — BP 135/62 | HR 77 | Temp 98.4°F | Resp 18

## 2021-03-11 DIAGNOSIS — C799 Secondary malignant neoplasm of unspecified site: Secondary | ICD-10-CM

## 2021-03-11 DIAGNOSIS — Z5112 Encounter for antineoplastic immunotherapy: Secondary | ICD-10-CM | POA: Diagnosis not present

## 2021-03-11 MED ORDER — SODIUM CHLORIDE 0.9% FLUSH
10.0000 mL | INTRAVENOUS | Status: DC | PRN
  Administered 2021-03-11: 10 mL

## 2021-03-11 MED ORDER — PEGFILGRASTIM-CBQV 6 MG/0.6ML ~~LOC~~ SOSY
6.0000 mg | PREFILLED_SYRINGE | Freq: Once | SUBCUTANEOUS | Status: AC
  Administered 2021-03-11: 6 mg via SUBCUTANEOUS
  Filled 2021-03-11: qty 0.6

## 2021-03-11 MED ORDER — HEPARIN SOD (PORK) LOCK FLUSH 100 UNIT/ML IV SOLN
500.0000 [IU] | Freq: Once | INTRAVENOUS | Status: AC | PRN
  Administered 2021-03-11: 500 [IU]

## 2021-03-11 NOTE — Patient Instructions (Signed)

## 2021-03-13 ENCOUNTER — Encounter: Payer: Self-pay | Admitting: Hematology and Oncology

## 2021-03-21 ENCOUNTER — Telehealth: Payer: Self-pay | Admitting: Hematology and Oncology

## 2021-03-21 NOTE — Telephone Encounter (Signed)
Scheduled per 12/29 los, patient has been called and notified of upcoming appointments.

## 2021-03-23 ENCOUNTER — Inpatient Hospital Stay: Payer: Medicare Other

## 2021-03-23 ENCOUNTER — Inpatient Hospital Stay: Payer: Medicare Other | Admitting: Dietician

## 2021-03-23 ENCOUNTER — Inpatient Hospital Stay (HOSPITAL_BASED_OUTPATIENT_CLINIC_OR_DEPARTMENT_OTHER): Payer: Medicare Other | Admitting: Hematology and Oncology

## 2021-03-23 ENCOUNTER — Other Ambulatory Visit: Payer: Self-pay

## 2021-03-23 VITALS — BP 137/66 | HR 73 | Temp 97.8°F | Resp 18 | Ht 63.0 in | Wt 133.9 lb

## 2021-03-23 DIAGNOSIS — C799 Secondary malignant neoplasm of unspecified site: Secondary | ICD-10-CM | POA: Diagnosis not present

## 2021-03-23 DIAGNOSIS — R160 Hepatomegaly, not elsewhere classified: Secondary | ICD-10-CM | POA: Diagnosis not present

## 2021-03-23 DIAGNOSIS — C189 Malignant neoplasm of colon, unspecified: Secondary | ICD-10-CM | POA: Diagnosis not present

## 2021-03-23 DIAGNOSIS — Z95828 Presence of other vascular implants and grafts: Secondary | ICD-10-CM

## 2021-03-23 DIAGNOSIS — Z5112 Encounter for antineoplastic immunotherapy: Secondary | ICD-10-CM | POA: Diagnosis not present

## 2021-03-23 LAB — CBC WITH DIFFERENTIAL (CANCER CENTER ONLY)
Abs Immature Granulocytes: 0.15 10*3/uL — ABNORMAL HIGH (ref 0.00–0.07)
Basophils Absolute: 0.1 10*3/uL (ref 0.0–0.1)
Basophils Relative: 1 %
Eosinophils Absolute: 0.1 10*3/uL (ref 0.0–0.5)
Eosinophils Relative: 1 %
HCT: 30.6 % — ABNORMAL LOW (ref 36.0–46.0)
Hemoglobin: 9.6 g/dL — ABNORMAL LOW (ref 12.0–15.0)
Immature Granulocytes: 1 %
Lymphocytes Relative: 11 %
Lymphs Abs: 1.2 10*3/uL (ref 0.7–4.0)
MCH: 24 pg — ABNORMAL LOW (ref 26.0–34.0)
MCHC: 31.4 g/dL (ref 30.0–36.0)
MCV: 76.5 fL — ABNORMAL LOW (ref 80.0–100.0)
Monocytes Absolute: 1.2 10*3/uL — ABNORMAL HIGH (ref 0.1–1.0)
Monocytes Relative: 11 %
Neutro Abs: 8.5 10*3/uL — ABNORMAL HIGH (ref 1.7–7.7)
Neutrophils Relative %: 75 %
Platelet Count: 191 10*3/uL (ref 150–400)
RBC: 4 MIL/uL (ref 3.87–5.11)
RDW: 19.6 % — ABNORMAL HIGH (ref 11.5–15.5)
WBC Count: 11.2 10*3/uL — ABNORMAL HIGH (ref 4.0–10.5)
nRBC: 0 % (ref 0.0–0.2)

## 2021-03-23 LAB — CMP (CANCER CENTER ONLY)
ALT: 12 U/L (ref 0–44)
AST: 22 U/L (ref 15–41)
Albumin: 3.1 g/dL — ABNORMAL LOW (ref 3.5–5.0)
Alkaline Phosphatase: 183 U/L — ABNORMAL HIGH (ref 38–126)
Anion gap: 5 (ref 5–15)
BUN: 8 mg/dL (ref 8–23)
CO2: 29 mmol/L (ref 22–32)
Calcium: 8.4 mg/dL — ABNORMAL LOW (ref 8.9–10.3)
Chloride: 101 mmol/L (ref 98–111)
Creatinine: 0.46 mg/dL (ref 0.44–1.00)
GFR, Estimated: >60 mL/min (ref >60)
Glucose, Bld: 112 mg/dL — ABNORMAL HIGH (ref 70–99)
Potassium: 3.4 mmol/L — ABNORMAL LOW (ref 3.5–5.1)
Sodium: 135 mmol/L (ref 135–145)
Total Bilirubin: 0.5 mg/dL (ref 0.3–1.2)
Total Protein: 5.8 g/dL — ABNORMAL LOW (ref 6.5–8.1)

## 2021-03-23 LAB — CEA (IN HOUSE-CHCC): CEA (CHCC-In House): 32.41 ng/mL — ABNORMAL HIGH (ref 0.00–5.00)

## 2021-03-23 MED ORDER — SODIUM CHLORIDE 0.9 % IV SOLN
10.0000 mg | Freq: Once | INTRAVENOUS | Status: AC
  Administered 2021-03-23: 10 mg via INTRAVENOUS
  Filled 2021-03-23: qty 10

## 2021-03-23 MED ORDER — SODIUM CHLORIDE 0.9 % IV SOLN
4.8000 mg/kg | Freq: Once | INTRAVENOUS | Status: AC
  Administered 2021-03-23: 300 mg via INTRAVENOUS
  Filled 2021-03-23: qty 12

## 2021-03-23 MED ORDER — OXALIPLATIN CHEMO INJECTION 100 MG/20ML
85.0000 mg/m2 | Freq: Once | INTRAVENOUS | Status: AC
  Administered 2021-03-23: 145 mg via INTRAVENOUS
  Filled 2021-03-23: qty 29

## 2021-03-23 MED ORDER — DEXTROSE 5 % IV SOLN: Freq: Once | INTRAVENOUS | Status: AC

## 2021-03-23 MED ORDER — FLUOROURACIL CHEMO INJECTION 2.5 GM/50ML
400.0000 mg/m2 | Freq: Once | INTRAVENOUS | Status: AC
  Administered 2021-03-23: 700 mg via INTRAVENOUS
  Filled 2021-03-23: qty 14

## 2021-03-23 MED ORDER — SODIUM CHLORIDE 0.9% FLUSH
10.0000 mL | Freq: Once | INTRAVENOUS | Status: AC
  Administered 2021-03-23: 10 mL

## 2021-03-23 MED ORDER — LEUCOVORIN CALCIUM INJECTION 350 MG
400.0000 mg/m2 | Freq: Once | INTRAVENOUS | Status: AC
  Administered 2021-03-23: 680 mg via INTRAVENOUS
  Filled 2021-03-23: qty 34

## 2021-03-23 MED ORDER — SODIUM CHLORIDE 0.9 % IV SOLN: Freq: Once | INTRAVENOUS | Status: AC

## 2021-03-23 MED ORDER — PALONOSETRON HCL INJECTION 0.25 MG/5ML
0.2500 mg | Freq: Once | INTRAVENOUS | Status: AC
  Administered 2021-03-23: 0.25 mg via INTRAVENOUS
  Filled 2021-03-23: qty 5

## 2021-03-23 MED ORDER — SODIUM CHLORIDE 0.9 % IV SOLN: 5.0000 mg/kg | Freq: Once | INTRAVENOUS | Status: DC

## 2021-03-23 MED ORDER — POTASSIUM CHLORIDE CRYS ER 20 MEQ PO TBCR: 20.0000 meq | EXTENDED_RELEASE_TABLET | Freq: Once | ORAL | 1 refills | Status: DC

## 2021-03-23 MED ORDER — SODIUM CHLORIDE 0.9 % IV SOLN
2400.0000 mg/m2 | INTRAVENOUS | Status: DC
  Administered 2021-03-23: 4100 mg via INTRAVENOUS
  Filled 2021-03-23: qty 82

## 2021-03-23 NOTE — Progress Notes (Signed)
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    2) 12/28/2020: CT abdomen/pelvis: Short segment colonic wall thickening at the hepatic flexure with pericolonic stranding and nodularity, concerning for primary colonic neoplasm. Recommend correlation with colonoscopy.  Findings of metastatic disease including multifocal omental, mesenteric, and peritoneal/retroperitoneal soft tissue implants, portocaval and retroperitoneal lymphadenopathy and multiple hepatic metastases. Question lucent lesion involving the S2 vertebral body with soft tissue density extending into the adjacent canal and partially expanded left S2 neural foramina. Recommend dedicated MRI of the sacrum for further characterization. Subpleural 0.7 cm nodule in the lingula is indeterminate. Further evaluation with dedicated CT chest is recommended.  A 1.0 cm lesion in the pancreatic neck without pancreatic ductal dilatation. This could be further characterized with MRI of the abdomen.   3) 01/04/2021: Establish care at [facility].   4) 01/06/2021: Underwent colonoscopy with Dr. Kathreen Devoid [facility]).  Findings revealed a circumferential, near obstructing colon mass concerning for malignancy.  Pathology revealed superficial fragments of colonic mucosa with extensive high-grade dysplasia.  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  No suspicious osseous lesion.  6) 01/11/2021: CT chest: Multiple small pulmonary densities in the bilateral lower lobes of indeterminate etiology.  Ill-defined hypodense lesion in the liver measuring at least 5.5  x 4.0 cm as well as few small hypodense lesions concerning for malignant process.  No mediastinal lymphadenopathy.  No suspicious osseous lesions  7) 01/17/2021: Underwent port placement and liver biopsy.  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  8) 01/26/2021: intended start of FOLFOX +Bevacizumab chemotherapy. Held due to Bilirubin 9.0, AST 252, ALT 282. Admitted to [facility] hospital.   9) 02/09/2021: Cycle 1 Day 1 of FOLFOX + Bevacizumab  10) 02/23/2021: Cycle 2 Day 1 of FOLFOX + Bevacizumab  11) 03/09/2021: Cycle 3 Day 1 of FOLFOX + Bevacizumab  12) 03/23/2021: Cycle 4 Day 1 of FOLFOX + Bevacizumab   Interval History:  Norma Morales returns for follow-up for metastatic colon cancer.  Her last visit was on 03/09/2021 for cycle 3 of FOLFOX.  She presents today for cycle 4 of treatment.  On exam today, Norma Morales is accompanied by her husband.  She notes she tolerated her shot of G-CSF well after her last treatment.  She took Claritin and Tylenol and did not experience any bone pain.  She notes that she does still struggle with some constipation but has not been as proactive with taking her anticonstipation medications prophylactically.  She notes that she does have some occasional issues with swallowing notes that it can "hurt" when she swallows.  She does have severe cold sensitivity with the oxaliplatin and that may be connected to the pain with swallowing.  Her weight is down slightly to 133 pounds down from 135 pounds at her last visit.  She denies any fevers, chills, nausea, vomiting, or diarrhea.  A full 10 point ROS is listed below.  She is willing and able to proceed with chemotherapy treatment today.  MEDICAL HISTORY:  Past Medical History:  Diagnosis Date   Cancer [facility])    colon   Hyperlipidemia     SURGICAL HISTORY: Past Surgical History:  Procedure Laterality Date   BILIARY STENT PLACEMENT N/A 01/27/2021   Procedure: BILIARY STENT PLACEMENT;   Surgeon: Clarene Essex, MD;  Location: [facility] ENDOSCOPY;  Service: Endoscopy;  Laterality: N/A;   ERCP N/A 01/27/2021   Procedure: ENDOSCOPIC RETROGRADE CHOLANGIOPANCREATOGRAPHY (ERCP);  Surgeon: Clarene Essex, MD;  Location: [facility];  Service: Endoscopy;  Laterality: N/A;   IR IMAGING GUIDED PORT INSERTION  01/17/2021   IR US GUIDE BX ASP/DRAIN  01/17/2021   SPHINCTEROTOMY  01/27/2021   Procedure: Joan Mayans;  Surgeon: Clarene Essex, MD;  Location: [facility] ENDOSCOPY;  Service: Endoscopy;;   TONSILLECTOMY      SOCIAL HISTORY: Social History   Socioeconomic History   Marital status: Married    Spouse name: Not on file   Number of children: Not on file   Years of education: Not on file   Highest education level: Not on file  Occupational History   Not on file  Tobacco Use   Smoking status: Never   Smokeless tobacco: Never  Substance and Sexual Activity   Alcohol use: Never   Drug use: Never   Sexual activity: Not on file  Other Topics Concern   Not on file  Social History Narrative   Not on file   Social Determinants of Health   Financial Resource Strain: Not on file  Food Insecurity: Not on file  Transportation Needs: Not on file  Physical Activity: Not on file  Stress: Not on file  Social Connections: Not on file  Intimate Partner Violence: Not on file    FAMILY HISTORY: No family history on file.  ALLERGIES:  is allergic to aspirin.  MEDICATIONS:  Current Outpatient Medications  Medication Sig Dispense Refill   loratadine (CLARITIN) 10 MG tablet Take 10 mg by mouth daily. For 5-7 days after neulasta injection     potassium chloride SA (KLOR-CON M) 20 MEQ tablet Take 1 tablet (20 mEq total) by mouth once for 1 dose. 30 tablet 1   alendronate (FOSAMAX) 70 MG tablet Take 70 mg by mouth every Sunday.     atenolol (TENORMIN) 25 MG tablet Take 25 mg by mouth at bedtime.     ezetimibe (ZETIA) 10 MG tablet Take 10 mg by mouth at bedtime.     NON FORMULARY Take 30 mLs by  mouth See admin instructions. Source of Life Gold Multivitamin Liquid-/Healthy Immune System & Well-Being (includes Vitamins D3, B12, K2 & Over 120 Whole Food Nutrients)- Drink 30 ml's by mouth once a day     ondansetron (ZOFRAN) 8 MG tablet Take 1 tablet (8 mg total) by mouth every 8 (eight) hours as needed for nausea or vomiting. 60 tablet 3   oxyCODONE (ROXICODONE) 5 MG immediate release tablet Take 1 tablet (5 mg total) by mouth every 8 (eight) hours as needed. 90 tablet 0   prochlorperazine (COMPAZINE) 10 MG tablet Take 1 tablet (10 mg total) by mouth every 6 (six) hours as needed for nausea or vomiting. 60 tablet 3   senna (SENOKOT) 8.6 MG TABS tablet Take 1 tablet (8.6 mg total) by mouth daily as needed for mild constipation. 30 tablet 0   No current facility-administered medications for this visit.   Facility-Administered Medications Ordered in Other Visits  Medication Dose Route Frequency Provider Last Rate Last Admin   bevacizumab-bvzr (ZIRABEV) 300 mg in sodium chloride 0.9 % 100 mL chemo infusion  4.8 mg/kg (Treatment Plan Recorded) Intravenous Once Ledell Peoples IV, MD       dextrose 5 % solution  Intravenous Once Orson Slick, MD       fluorouracil (ADRUCIL) 4,100 mg in sodium chloride 0.9 % 68 mL chemo infusion  2,400 mg/m2 (Treatment Plan Recorded) Intravenous 1 day or 1 dose Orson Slick, MD       fluorouracil (ADRUCIL) chemo injection 700 mg  400 mg/m2 (Treatment Plan Recorded) Intravenous Once Orson Slick, MD       leucovorin 680 mg in dextrose 5 % 250 mL infusion  400 mg/m2 (Treatment Plan Recorded) Intravenous Once Orson Slick, MD       oxaliplatin (ELOXATIN) 145 mg in dextrose 5 % 500 mL chemo infusion  85 mg/m2 (Treatment Plan Recorded) Intravenous Once Orson Slick, MD        REVIEW OF SYSTEMS:   Constitutional: ( - ) fevers, ( - )  chills , ( - ) night sweats Eyes: ( - ) blurriness of vision, ( - ) double vision, ( - ) watery eyes Ears, nose,  mouth, throat, and face: ( - ) mucositis, ( - ) sore throat Respiratory: ( - ) cough, ( - ) dyspnea, ( - ) wheezes Cardiovascular: ( - ) palpitation, ( - ) chest discomfort, ( - ) lower extremity swelling Gastrointestinal:  ( - ) nausea, ( - ) heartburn, ( - ) change in bowel habits Skin: ( - ) abnormal skin rashes Lymphatics: ( - ) new lymphadenopathy, ( - ) easy bruising Neurological: ( - ) numbness, ( - ) tingling, ( - ) new weaknesses Behavioral/Psych: ( - ) mood change, ( - ) new changes  All other systems were reviewed with the patient and are negative.  PHYSICAL EXAMINATION: ECOG PERFORMANCE STATUS: 1 - Symptomatic but completely ambulatory  Vitals:   03/23/21 1030  BP: 137/66  Pulse: 73  Resp: 18  Temp: 97.8 F (36.6 C)  SpO2: 100%    Filed Weights   03/23/21 1030  Weight: 133 lb 14.4 oz (60.7 kg)    GENERAL: well appearing Caucasian female in NAD  SKIN:  rashes or significant lesions. Jaundice.  EYES: conjunctiva are pink and non-injected, sclera icteric LUNGS: clear to auscultation and percussion with normal breathing effort HEART: regular rate & rhythm and no murmurs and no lower extremity edema Musculoskeletal: no cyanosis of digits and no clubbing  PSYCH: alert & oriented x 3, fluent speech NEURO: no focal motor/sensory deficits  LABORATORY DATA:  I have reviewed the data as listed CBC Latest Ref Rng & Units 03/23/2021 03/09/2021 02/23/2021  WBC 4.0 - 10.5 K/uL 11.2(H) 2.7(L) 4.6  Hemoglobin 12.0 - 15.0 g/dL 9.6(L) 8.7(L) 8.9(L)  Hematocrit 36.0 - 46.0 % 30.6(L) 27.6(L) 27.4(L)  Platelets 150 - 400 K/uL 191 304 406(H)    CMP Latest Ref Rng & Units 03/23/2021 03/09/2021 02/23/2021  Glucose 70 - 99 mg/dL 112(H) 129(H) 106(H)  BUN 8 - 23 mg/dL $Remove'8 11 11  'urcSCdL$ Creatinine 0.44 - 1.00 mg/dL 0.46 0.52 0.45  Sodium 135 - 145 mmol/L 135 133(L) 132(L)  Potassium 3.5 - 5.1 mmol/L 3.4(L) 3.8 4.2  Chloride 98 - 111 mmol/L 101 98 99  CO2 22 - 32 mmol/L $RemoveB'29 28 27  'OMuTGhmT$ Calcium 8.9  - 10.3 mg/dL 8.4(L) 8.8(L) 8.8(L)  Total Protein 6.5 - 8.1 g/dL 5.8(L) 6.3(L) 6.4(L)  Total Bilirubin 0.3 - 1.2 mg/dL 0.5 0.8 1.0  Alkaline Phos 38 - 126 U/L 183(H) 195(H) 317(H)  AST 15 - 41 U/L 22 23 47(H)  ALT 0 - 44  U/L 12 18 35    PATHOLOGY: SURGICAL PATHOLOGY  CASE: [id number]  A. LIVER, RIGHT, MASS, BIOPSY:  - Metastatic adenocarcinoma consistent with colorectal origin.   COMMENT:  Sections demonstrate metastatic moderately differentiated adenocarcinoma  with dirty necrosis and extracellular mucin involving hepatic  parenchyma. A limited panel of immunohistochemical stains was performed  and the adenocarcinoma is positive for cytokeratin 20 and CDX-2 while  negative for cytokeratin 7. The morphologic findings in conjunction with  the pattern of immunohistochemical staining is consistent with  metastatic adenocarcinoma of colorectal origin.   RADIOGRAPHIC STUDIES: I have personally reviewed the radiological images as listed and agreed with the findings in the report. No results found.  ASSESSMENT & PLAN Oceana Walthall is a 76 year old female who returns for follow-up for metastatic colon cancer.    The mainstay treatment will be chemotherapy with the rationale to prolong life and help improve cancer related symptoms.  This is unfortunately not a curable disease.  The treatment regimen includes infusional 5-FU, oxaliplatin and bevacizumab given once every 2 weeks.  The dose, frequency and common side effects were discussed with the patient.  Common side effects include fatigue, appetite loss, nausea, diarrhea, cold sensitivity, neuropathy, poor wound healing and increased risk of bleeding.  We will plan to obtain repeat imaging in 2 to 3 months to assess treatment response.  Additionally, we have additional biomarkers including KRAS WT, NRAS WT, BRAF mutation positive and MSS.   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 Plan: --today is Cycle 4 Day 1 of FOLFOX plus Bevacizumab --next CT scan due in Feb 2023 --RTC in 2 weeks for continued chemotherapy.  # Elevated Bilirubin/LFTs, normalized -- Patient was admitted after last visit 2 weeks ago and underwent ERCP with biliary stent placement. --Solid improvement in her LFTs.  AST today 22, ALT 12, and total bilirubin 0.5.   --Okay to proceed with chemotherapy today.  #RUQ abdominal pain: --Likely secondary to liver metastases --Currently on oxycodone 5 mg q 8 hours with improvement of pain. Refill sent.  --Advised patient to be aggressive with her bowel regimen to minimize constipation.   #Supportive Care -- chemotherapy education complete -- port placed -- zofran 8mg  q8H PRN and compazine 10mg  PO q6H for nausea -- EMLA cream for port -- oxycodone 45m PO q8H PRN for pain control (as above)  No orders of the defined types were placed in this encounter.  All questions were answered. The patient knows to call the clinic with any problems, questions or concerns.  I have spent a total of 30 minutes minutes of face-to-face and non-face-to-face time, preparing to see the patient, obtaining and/or reviewing separately obtained history, performing a medically appropriate examination, counseling and educating the patient, ordering medications, referring and communicating with other health care professionals, documenting clinical information in the electronic health record, and care coordination.   Ledell Peoples, MD Department of Hematology/Oncology [place] at [facility] Phone: [phone] Pager: [phone] Email: [email or web address]

## 2021-03-23 NOTE — Progress Notes (Signed)
Ok to speed up take home pump per Dr.Dorsey, will run over 44hrs instead of 46

## 2021-03-23 NOTE — Patient Instructions (Signed)
[facility] ONCOLOGY  Discharge Instructions: Thank you for choosing [facility] to provide your oncology and hematology care.   If you have a lab appointment with the [facility], please go directly to the [facility] and check in at the registration area.   Wear comfortable clothing and clothing appropriate for easy access to any Portacath or PICC line.   We strive to give you quality time with your provider. You may need to reschedule your appointment if you arrive late (15 or more minutes).  Arriving late affects you and other patients whose appointments are after yours.  Also, if you miss three or more appointments without notifying the office, you may be dismissed from the clinic at the providers discretion.      For prescription refill requests, have your pharmacy contact our office and allow 72 hours for refills to be completed.    Today you received the following chemotherapy and/or immunotherapy agents: avastin/oxaliplatin/leucovorin/5FU      To help prevent nausea and vomiting after your treatment, we encourage you to take your nausea medication as directed.  BELOW ARE SYMPTOMS THAT SHOULD BE REPORTED IMMEDIATELY: *FEVER GREATER THAN 100.4 F (38 C) OR HIGHER *CHILLS OR SWEATING *NAUSEA AND VOMITING THAT IS NOT CONTROLLED WITH YOUR NAUSEA MEDICATION *UNUSUAL SHORTNESS OF BREATH *UNUSUAL BRUISING OR BLEEDING *URINARY PROBLEMS (pain or burning when urinating, or frequent urination) *BOWEL PROBLEMS (unusual diarrhea, constipation, pain near the anus) TENDERNESS IN MOUTH AND THROAT WITH OR WITHOUT PRESENCE OF ULCERS (sore throat, sores in mouth, or a toothache) UNUSUAL RASH, SWELLING OR PAIN  UNUSUAL VAGINAL DISCHARGE OR ITCHING   Items with * indicate a potential emergency and should be followed up as soon as possible or go to the Emergency Department if any problems should occur.  Please show the CHEMOTHERAPY ALERT CARD or IMMUNOTHERAPY  ALERT CARD at check-in to the Emergency Department and triage nurse.  Should you have questions after your visit or need to cancel or reschedule your appointment, please contact [facility]  Dept: [phone]  and follow the prompts.  Office hours are 8:00 a.m. to 4:30 p.m. Monday - Friday. Please note that voicemails left after 4:00 p.m. may not be returned until the following business day.  We are closed weekends and major holidays. You have access to a nurse at all times for urgent questions. Please call the main number to the clinic Dept: [phone] and follow the prompts.   For any non-urgent questions, you may also contact your provider using MyChart. We now offer e-Visits for anyone 69 and older to request care online for non-urgent symptoms. For details visit [email or web address].   Also download the MyChart app! Go to the app store, search "MyChart", open the app, select [facility], and log in with your MyChart username and password.  Due to Covid, a mask is required upon entering the hospital/clinic. If you do not have a mask, one will be given to you upon arrival. For doctor visits, patients may have 1 support person aged 17 or older with them. For treatment visits, patients cannot have anyone with them due to current Covid guidelines and our immunocompromised population.

## 2021-03-25 ENCOUNTER — Other Ambulatory Visit: Payer: Self-pay

## 2021-03-25 ENCOUNTER — Inpatient Hospital Stay: Payer: Medicare Other

## 2021-03-25 VITALS — BP 118/65 | HR 71 | Temp 98.0°F | Resp 18

## 2021-03-25 DIAGNOSIS — C799 Secondary malignant neoplasm of unspecified site: Secondary | ICD-10-CM

## 2021-03-25 DIAGNOSIS — Z5112 Encounter for antineoplastic immunotherapy: Secondary | ICD-10-CM | POA: Diagnosis not present

## 2021-03-25 MED ORDER — SODIUM CHLORIDE 0.9% FLUSH
10.0000 mL | INTRAVENOUS | Status: DC | PRN
  Administered 2021-03-25: 10 mL

## 2021-03-25 MED ORDER — PEGFILGRASTIM-CBQV 6 MG/0.6ML ~~LOC~~ SOSY
6.0000 mg | PREFILLED_SYRINGE | Freq: Once | SUBCUTANEOUS | Status: AC
  Administered 2021-03-25: 6 mg via SUBCUTANEOUS

## 2021-03-25 MED ORDER — HEPARIN SOD (PORK) LOCK FLUSH 100 UNIT/ML IV SOLN
500.0000 [IU] | Freq: Once | INTRAVENOUS | Status: AC | PRN
  Administered 2021-03-25: 500 [IU]

## 2021-03-25 NOTE — Patient Instructions (Signed)

## 2021-03-26 ENCOUNTER — Encounter: Payer: Self-pay | Admitting: Hematology and Oncology

## 2021-03-26 NOTE — Progress Notes (Signed)
Nutrition Follow-up:  Patient receiving FOLFOX + Bevacizumab for metastatic colon cancer.  Met with patient during infusion. She reports increased cold sensitivity lasting "almost the whole time" She reports bottles of water that have been sitting out on the counter are still too cold to drink ~8-10 days after treatment. Patient reports poor appetite and fatigue. This improves slowly after each cycle. Patient states once she is eating well it is time for the next treatment. Patient likes the taste of rice cakes with peanut butter. She had a grilled cheese for lunch yesterday and BBQ, hushpuppies, and slaw for dinner. She is drinking Ensure when cold sensitivity subsides. Patient is unable to tolerate ONS at room temperature. She has not tried the warm supplement recipes. Patient continues to have constipation. She reports provider has added daily laxative to bowel regimen.   Medications: Klor-con  Labs: K 3.4, Glucose 112  Anthropometrics: Weights slowly trending down. Pt 133 lb 14.4 oz today  1/12 - 135 lb 6.4 oz 12/29 - 136 lb 14.4 oz  12/15 - 140 lb 4.8 oz 12/1 - 138 lb 3.7 oz  11/23 - 146 lb 9.6 oz 11/9 - 146 lb 4.8 oz   Weights have decreased 8.7% (12.7 lbs) from usual weight in 2 months; significant  NUTRITION DIAGNOSIS: Unintended weight loss ongoing   INTERVENTION:  Encouraged high calorie high protein foods Discussed strategies for increasing intake the first few days following treatment Recommend pt drink 2-3 Ensure Plus/equivalent daily Patient has been provided warm supplement recipes Continue bowel regimen for constipation per MD    MONITORING, EVALUATION, GOAL: weight trends, intake   NEXT VISIT: Thursday February 9 during infusion

## 2021-03-27 ENCOUNTER — Other Ambulatory Visit: Payer: Self-pay | Admitting: Hematology and Oncology

## 2021-03-27 ENCOUNTER — Telehealth: Payer: Self-pay

## 2021-03-27 MED ORDER — OXYCODONE HCL 5 MG PO TABS: 5.0000 mg | ORAL_TABLET | Freq: Three times a day (TID) | ORAL | 0 refills | Status: DC | PRN

## 2021-03-27 NOTE — Telephone Encounter (Signed)
Pt called requesting a refill of Oxycodone and to review how she is taking her antiemetics.  Dr. Lorenso Courier has sent her refill as requested and I have reviewed her Zofran and compazine usage with the pt. She understands she can take her Zofran q8h and Compazine q6h. Pt expressed understanding of this information.

## 2021-04-06 ENCOUNTER — Other Ambulatory Visit: Payer: Self-pay

## 2021-04-06 ENCOUNTER — Other Ambulatory Visit: Payer: Self-pay | Admitting: *Deleted

## 2021-04-06 ENCOUNTER — Inpatient Hospital Stay (HOSPITAL_BASED_OUTPATIENT_CLINIC_OR_DEPARTMENT_OTHER): Payer: Medicare Other | Admitting: Hematology and Oncology

## 2021-04-06 ENCOUNTER — Inpatient Hospital Stay: Payer: Medicare Other

## 2021-04-06 ENCOUNTER — Other Ambulatory Visit: Payer: Self-pay | Admitting: Hematology and Oncology

## 2021-04-06 ENCOUNTER — Inpatient Hospital Stay: Payer: Medicare Other | Admitting: Dietician

## 2021-04-06 ENCOUNTER — Inpatient Hospital Stay: Payer: Medicare Other | Attending: Physician Assistant

## 2021-04-06 VITALS — BP 122/74 | HR 74 | Temp 97.7°F | Resp 16 | Wt 126.8 lb

## 2021-04-06 DIAGNOSIS — C189 Malignant neoplasm of colon, unspecified: Secondary | ICD-10-CM

## 2021-04-06 DIAGNOSIS — C799 Secondary malignant neoplasm of unspecified site: Secondary | ICD-10-CM

## 2021-04-06 DIAGNOSIS — Z95828 Presence of other vascular implants and grafts: Secondary | ICD-10-CM | POA: Diagnosis not present

## 2021-04-06 DIAGNOSIS — K529 Noninfective gastroenteritis and colitis, unspecified: Secondary | ICD-10-CM | POA: Diagnosis not present

## 2021-04-06 DIAGNOSIS — G893 Neoplasm related pain (acute) (chronic): Secondary | ICD-10-CM | POA: Insufficient documentation

## 2021-04-06 DIAGNOSIS — Z5189 Encounter for other specified aftercare: Secondary | ICD-10-CM | POA: Insufficient documentation

## 2021-04-06 DIAGNOSIS — Z79899 Other long term (current) drug therapy: Secondary | ICD-10-CM | POA: Insufficient documentation

## 2021-04-06 DIAGNOSIS — Z5112 Encounter for antineoplastic immunotherapy: Secondary | ICD-10-CM | POA: Diagnosis not present

## 2021-04-06 DIAGNOSIS — C183 Malignant neoplasm of hepatic flexure: Secondary | ICD-10-CM | POA: Diagnosis present

## 2021-04-06 DIAGNOSIS — R17 Unspecified jaundice: Secondary | ICD-10-CM

## 2021-04-06 DIAGNOSIS — C786 Secondary malignant neoplasm of retroperitoneum and peritoneum: Secondary | ICD-10-CM | POA: Insufficient documentation

## 2021-04-06 DIAGNOSIS — C787 Secondary malignant neoplasm of liver and intrahepatic bile duct: Secondary | ICD-10-CM | POA: Insufficient documentation

## 2021-04-06 DIAGNOSIS — Z5111 Encounter for antineoplastic chemotherapy: Secondary | ICD-10-CM | POA: Diagnosis present

## 2021-04-06 LAB — CMP (CANCER CENTER ONLY)
ALT: 11 U/L (ref 0–44)
AST: 19 U/L (ref 15–41)
Albumin: 2.8 g/dL — ABNORMAL LOW (ref 3.5–5.0)
Alkaline Phosphatase: 244 U/L — ABNORMAL HIGH (ref 38–126)
Anion gap: 5 (ref 5–15)
BUN: 10 mg/dL (ref 8–23)
CO2: 29 mmol/L (ref 22–32)
Calcium: 8.1 mg/dL — ABNORMAL LOW (ref 8.9–10.3)
Chloride: 102 mmol/L (ref 98–111)
Creatinine: 0.54 mg/dL (ref 0.44–1.00)
GFR, Estimated: >60 mL/min (ref >60)
Glucose, Bld: 108 mg/dL — ABNORMAL HIGH (ref 70–99)
Potassium: 3.8 mmol/L (ref 3.5–5.1)
Sodium: 136 mmol/L (ref 135–145)
Total Bilirubin: 0.4 mg/dL (ref 0.3–1.2)
Total Protein: 5.3 g/dL — ABNORMAL LOW (ref 6.5–8.1)

## 2021-04-06 LAB — CBC WITH DIFFERENTIAL (CANCER CENTER ONLY)
Abs Immature Granulocytes: 0.09 10*3/uL — ABNORMAL HIGH (ref 0.00–0.07)
Basophils Absolute: 0.1 10*3/uL (ref 0.0–0.1)
Basophils Relative: 1 %
Eosinophils Absolute: 0.2 10*3/uL (ref 0.0–0.5)
Eosinophils Relative: 1 %
HCT: 32.4 % — ABNORMAL LOW (ref 36.0–46.0)
Hemoglobin: 10.2 g/dL — ABNORMAL LOW (ref 12.0–15.0)
Immature Granulocytes: 1 %
Lymphocytes Relative: 9 %
Lymphs Abs: 1.2 10*3/uL (ref 0.7–4.0)
MCH: 24.2 pg — ABNORMAL LOW (ref 26.0–34.0)
MCHC: 31.5 g/dL (ref 30.0–36.0)
MCV: 77 fL — ABNORMAL LOW (ref 80.0–100.0)
Monocytes Absolute: 1.1 10*3/uL — ABNORMAL HIGH (ref 0.1–1.0)
Monocytes Relative: 8 %
Neutro Abs: 10.7 10*3/uL — ABNORMAL HIGH (ref 1.7–7.7)
Neutrophils Relative %: 80 %
Platelet Count: 227 10*3/uL (ref 150–400)
RBC: 4.21 MIL/uL (ref 3.87–5.11)
RDW: 23.1 % — ABNORMAL HIGH (ref 11.5–15.5)
WBC Count: 13.3 10*3/uL — ABNORMAL HIGH (ref 4.0–10.5)
nRBC: 0 % (ref 0.0–0.2)

## 2021-04-06 LAB — TOTAL PROTEIN, URINE DIPSTICK: Protein, ur: NEGATIVE mg/dL

## 2021-04-06 LAB — CEA (IN HOUSE-CHCC): CEA (CHCC-In House): 26.78 ng/mL — ABNORMAL HIGH (ref 0.00–5.00)

## 2021-04-06 MED ORDER — SODIUM CHLORIDE 0.9% FLUSH: 10.0000 mL | INTRAVENOUS | Status: DC | PRN

## 2021-04-06 MED ORDER — SODIUM CHLORIDE 0.9 % IV SOLN
10.0000 mg | Freq: Once | INTRAVENOUS | Status: AC
  Administered 2021-04-06: 10 mg via INTRAVENOUS
  Filled 2021-04-06: qty 10

## 2021-04-06 MED ORDER — SODIUM CHLORIDE 0.9 % IV SOLN
300.0000 mg | Freq: Once | INTRAVENOUS | Status: AC
  Administered 2021-04-06: 300 mg via INTRAVENOUS
  Filled 2021-04-06: qty 12

## 2021-04-06 MED ORDER — SODIUM CHLORIDE 0.9 % IV SOLN
2400.0000 mg/m2 | INTRAVENOUS | Status: DC
  Administered 2021-04-06: 4100 mg via INTRAVENOUS
  Filled 2021-04-06: qty 82

## 2021-04-06 MED ORDER — DEXTROSE 5 % IV SOLN: Freq: Once | INTRAVENOUS | Status: AC

## 2021-04-06 MED ORDER — LEUCOVORIN CALCIUM INJECTION 350 MG
400.0000 mg/m2 | Freq: Once | INTRAVENOUS | Status: AC
  Administered 2021-04-06: 680 mg via INTRAVENOUS
  Filled 2021-04-06: qty 34

## 2021-04-06 MED ORDER — OXALIPLATIN CHEMO INJECTION 100 MG/20ML
85.0000 mg/m2 | Freq: Once | INTRAVENOUS | Status: AC
  Administered 2021-04-06: 145 mg via INTRAVENOUS
  Filled 2021-04-06: qty 29

## 2021-04-06 MED ORDER — SODIUM CHLORIDE 0.9 % IV SOLN: Freq: Once | INTRAVENOUS | Status: AC

## 2021-04-06 MED ORDER — PALONOSETRON HCL INJECTION 0.25 MG/5ML
0.2500 mg | Freq: Once | INTRAVENOUS | Status: AC
  Administered 2021-04-06: 0.25 mg via INTRAVENOUS
  Filled 2021-04-06: qty 5

## 2021-04-06 MED ORDER — FLUOROURACIL CHEMO INJECTION 2.5 GM/50ML
400.0000 mg/m2 | Freq: Once | INTRAVENOUS | Status: AC
  Administered 2021-04-06: 700 mg via INTRAVENOUS
  Filled 2021-04-06: qty 14

## 2021-04-06 NOTE — Progress Notes (Signed)
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    2) 12/28/2020: CT abdomen/pelvis: Short segment colonic wall thickening at the hepatic flexure with pericolonic stranding and nodularity, concerning for primary colonic neoplasm. Recommend correlation with colonoscopy.  Findings of metastatic disease including multifocal omental, mesenteric, and peritoneal/retroperitoneal soft tissue implants, portocaval and retroperitoneal lymphadenopathy and multiple hepatic metastases. Question lucent lesion involving the S2 vertebral body with soft tissue density extending into the adjacent canal and partially expanded left S2 neural foramina. Recommend dedicated MRI of the sacrum for further characterization. Subpleural 0.7 cm nodule in the lingula is indeterminate. Further evaluation with dedicated CT chest is recommended.  A 1.0 cm lesion in the pancreatic neck without pancreatic ductal dilatation. This could be further characterized with MRI of the abdomen.   3) 01/04/2021: Establish care at [facility].   4) 01/06/2021: Underwent colonoscopy with Dr. Kathreen [facility]).  Findings revealed a circumferential, near obstructing colon mass concerning for malignancy.  Pathology revealed superficial fragments of colonic mucosa with extensive high-grade dysplasia.  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  No suspicious osseous lesion.  6) 01/11/2021: CT chest: Multiple small pulmonary densities in the bilateral lower lobes of indeterminate etiology.  Ill-defined hypodense lesion in the liver measuring at least 5.5  x 4.0 cm as well as few small hypodense lesions concerning for malignant process.  No mediastinal lymphadenopathy.  No suspicious osseous lesions  7) 01/17/2021: Underwent port placement and liver biopsy.  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  8) 01/26/2021: intended start of FOLFOX +Bevacizumab chemotherapy. Held due to Bilirubin 9.0, AST 252, ALT 282. Admitted to [facility] hospital.   9) 02/09/2021: Cycle 1 Day 1 of FOLFOX + Bevacizumab  10) 02/23/2021: Cycle 2 Day 1 of FOLFOX + Bevacizumab  11) 03/09/2021: Cycle 3 Day 1 of FOLFOX + Bevacizumab  12) 03/23/2021: Cycle 4 Day 1 of FOLFOX + Bevacizumab  13) 04/06/2021: Cycle 5 Day 1 of FOLFOX + Bevacizumab  Interval History:  Norma Morales returns for follow-up for metastatic colon cancer.  Her last visit was on 03/23/2021 for cycle 4 of FOLFOX.  She presents today for cycle 5 of treatment.  On exam today, Norma Morales is accompanied by her husband.  She notes her weight has continued to climb and is down to 126 pounds.  She reports that she has been "feeling off" for the last week.  She has been having some stomach trouble particularly with nausea but no vomiting.  She is also continuing to struggle with constipation and that has been decreasing her appetite and desire to eat.  She notes that she has been having some indigestion in her stomach and the Tums has helped with this.  She notes that she is drinking protein boost drinks approximately 2 times per day.  She is also disappointed by the fact that her cold sensitivity has lasted the full 2 weeks.  She denies any fevers, chills, vomiting, or diarrhea.  A full 10 point ROS is listed below.  She is willing and able to proceed with chemotherapy treatment today.  MEDICAL HISTORY:  Past Medical History:  Diagnosis Date   Cancer (  [place])    colon   Hyperlipidemia     SURGICAL HISTORY: Past Surgical History:  Procedure Laterality Date   BILIARY STENT PLACEMENT N/A  01/27/2021   Procedure: BILIARY STENT PLACEMENT;  Surgeon: Clarene Essex, MD;  Location: [facility] ENDOSCOPY;  Service: Endoscopy;  Laterality: N/A;   ERCP N/A 01/27/2021   Procedure: ENDOSCOPIC RETROGRADE CHOLANGIOPANCREATOGRAPHY (ERCP);  Surgeon: Clarene Essex, MD;  Location: [facility];  Service: Endoscopy;  Laterality: N/A;   IR IMAGING GUIDED PORT INSERTION  01/17/2021   IR US GUIDE BX ASP/DRAIN  01/17/2021   SPHINCTEROTOMY  01/27/2021   Procedure: Joan Mayans;  Surgeon: Clarene Essex, MD;  Location: [facility] ENDOSCOPY;  Service: Endoscopy;;   TONSILLECTOMY      SOCIAL HISTORY: Social History   Socioeconomic History   Marital status: Married    Spouse name: Not on file   Number of children: Not on file   Years of education: Not on file   Highest education level: Not on file  Occupational History   Not on file  Tobacco Use   Smoking status: Never   Smokeless tobacco: Never  Substance and Sexual Activity   Alcohol use: Never   Drug use: Never   Sexual activity: Not on file  Other Topics Concern   Not on file  Social History Narrative   Not on file   Social Determinants of Health   Financial Resource Strain: Not on file  Food Insecurity: Not on file  Transportation Needs: Not on file  Physical Activity: Not on file  Stress: Not on file  Social Connections: Not on file  Intimate Partner Violence: Not on file    FAMILY HISTORY: No family history on file.  ALLERGIES:  is allergic to aspirin.  MEDICATIONS:  Current Outpatient Medications  Medication Sig Dispense Refill   alendronate (FOSAMAX) 70 MG tablet Take 70 mg by mouth every Sunday.     atenolol (TENORMIN) 25 MG tablet Take 25 mg by mouth at bedtime.     ezetimibe (ZETIA) 10 MG tablet Take 10 mg by mouth at bedtime.     loratadine (CLARITIN) 10 MG tablet Take 10 mg by mouth daily. For 5-7 days after neulasta injection     NON FORMULARY Take 30 mLs by mouth See admin instructions. [employer] of [employer] Gold Multivitamin  Liquid-/Healthy Immune System & Well-Being (includes Vitamins D3, B12, K2 & Over 120 Whole Food Nutrients)- Drink 30 ml's by mouth once a day     ondansetron (ZOFRAN) 8 MG tablet Take 1 tablet (8 mg total) by mouth every 8 (eight) hours as needed for nausea or vomiting. 60 tablet 3   oxyCODONE (ROXICODONE) 5 MG immediate release tablet Take 1 tablet (5 mg total) by mouth every 8 (eight) hours as needed. 90 tablet 0   potassium chloride SA (KLOR-CON M) 20 MEQ tablet Take 1 tablet (20 mEq total) by mouth once for 1 dose. 30 tablet 1   prochlorperazine (COMPAZINE) 10 MG tablet Take 1 tablet (10 mg total) by mouth every 6 (six) hours as needed for nausea or vomiting. 60 tablet 3   senna (SENOKOT) 8.6 MG TABS tablet Take 1 tablet (8.6 mg total) by mouth daily as needed for mild constipation. 30 tablet 0   No current facility-administered medications for this visit.    REVIEW OF SYSTEMS:   Constitutional: ( - ) fevers, ( - )  chills , ( - ) night sweats Eyes: ( - ) blurriness of vision, ( - ) double vision, ( - )  watery eyes Ears, nose, mouth, throat, and face: ( - ) mucositis, ( - ) sore throat Respiratory: ( - ) cough, ( - ) dyspnea, ( - ) wheezes Cardiovascular: ( - ) palpitation, ( - ) chest discomfort, ( - ) lower extremity swelling Gastrointestinal:  ( - ) nausea, ( - ) heartburn, ( - ) change in bowel habits Skin: ( - ) abnormal skin rashes Lymphatics: ( - ) new lymphadenopathy, ( - ) easy bruising Neurological: ( - ) numbness, ( - ) tingling, ( - ) new weaknesses Behavioral/Psych: ( - ) mood change, ( - ) new changes  All other systems were reviewed with the patient and are negative.  PHYSICAL EXAMINATION: ECOG PERFORMANCE STATUS: 1 - Symptomatic but completely ambulatory  Vitals:   04/06/21 1100  BP: 122/74  Pulse: 74  Resp: 16  Temp: 97.7 F (36.5 C)  SpO2: 100%    Filed Weights   04/06/21 1100  Weight: 126 lb 12.8 oz (57.5 kg)    GENERAL: well appearing Caucasian female  in NAD  SKIN:  rashes or significant lesions. Jaundice.  EYES: conjunctiva are pink and non-injected, sclera icteric LUNGS: clear to auscultation and percussion with normal breathing effort HEART: regular rate & rhythm and no murmurs and no lower extremity edema Musculoskeletal: no cyanosis of digits and no clubbing  PSYCH: alert & oriented x 3, fluent speech NEURO: no focal motor/sensory deficits  LABORATORY DATA:  I have reviewed the data as listed CBC Latest Ref Rng & Units 04/06/2021 03/23/2021 03/09/2021  WBC 4.0 - 10.5 K/uL 13.3(H) 11.2(H) 2.7(L)  Hemoglobin 12.0 - 15.0 g/dL 10.2(L) 9.6(L) 8.7(L)  Hematocrit 36.0 - 46.0 % 32.4(L) 30.6(L) 27.6(L)  Platelets 150 - 400 K/uL 227 191 304    CMP Latest Ref Rng & Units 04/06/2021 03/23/2021 03/09/2021  Glucose 70 - 99 mg/dL 108(H) 112(H) 129(H)  BUN 8 - 23 mg/dL $Remove'10 8 11  'hLmvBgm$ Creatinine 0.44 - 1.00 mg/dL 0.54 0.46 0.52  Sodium 135 - 145 mmol/L 136 135 133(L)  Potassium 3.5 - 5.1 mmol/L 3.8 3.4(L) 3.8  Chloride 98 - 111 mmol/L 102 101 98  CO2 22 - 32 mmol/L $RemoveB'29 29 28  'rcAUXipk$ Calcium 8.9 - 10.3 mg/dL 8.1(L) 8.4(L) 8.8(L)  Total Protein 6.5 - 8.1 g/dL 5.3(L) 5.8(L) 6.3(L)  Total Bilirubin 0.3 - 1.2 mg/dL 0.4 0.5 0.8  Alkaline Phos 38 - 126 U/L 244(H) 183(H) 195(H)  AST 15 - 41 U/L $Remo'19 22 23  'MXrjf$ ALT 0 - 44 U/L $Remo'11 12 18    'yXxza$ PATHOLOGY: SURGICAL PATHOLOGY  CASE: [id number]  A. LIVER, RIGHT, MASS, BIOPSY:  - Metastatic adenocarcinoma consistent with colorectal origin.   COMMENT:  Sections demonstrate metastatic moderately differentiated adenocarcinoma  with dirty necrosis and extracellular mucin involving hepatic  parenchyma. A limited panel of immunohistochemical stains was performed  and the adenocarcinoma is positive for cytokeratin 20 and CDX-2 while  negative for cytokeratin 7. The morphologic findings in conjunction with  the pattern of immunohistochemical staining is consistent with  metastatic adenocarcinoma of colorectal origin.    RADIOGRAPHIC STUDIES: I have personally reviewed the radiological images as listed and agreed with the findings in the report. No results found.  ASSESSMENT & PLAN Norma Morales is a 76 year old female who returns for follow-up for metastatic colon cancer.    The mainstay treatment will be chemotherapy with the rationale to prolong life and help improve cancer related symptoms.  This is unfortunately not a curable disease.  The treatment regimen  includes infusional 5-FU, oxaliplatin and bevacizumab given once every 2 weeks.  The dose, frequency and common side effects were discussed with the patient.  Common side effects include fatigue, appetite loss, nausea, diarrhea, cold sensitivity, neuropathy, poor wound healing and increased risk of bleeding.  We will plan to obtain repeat imaging in 2 to 3 months to assess treatment response.  Additionally, we have additional biomarkers including KRAS WT, NRAS WT, BRAF mutation positive and MSS.   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 Plan: --today is Cycle 5 Day 1 of FOLFOX plus Bevacizumab --next CT scan due in Feb 2023 --RTC in 2 weeks for continued chemotherapy.  # Elevated Bilirubin/LFTs, normalized -- Patient was admitted and underwent ERCP with biliary stent placement. --Solid improvement in her LFTs. Currently normalized with elevated Alk phos at 244.  --Okay to proceed with chemotherapy today.  #RUQ abdominal pain: --Likely secondary to liver metastases --Currently on oxycodone 5 mg q 8 hours with improvement of pain. Refill sent.  --Advised patient to be aggressive with her bowel regimen to minimize constipation.   #Supportive Care --  chemotherapy education complete -- port placed -- zofran 8mg  q8H PRN and compazine 10mg  PO q6H for nausea -- EMLA cream for port -- oxycodone 44m PO q8H PRN for pain control (as above)  Orders Placed This Encounter  Procedures   CT CHEST ABDOMEN PELVIS W CONTRAST    Standing Status:   Future    Standing Expiration Date:   04/06/2022    Order Specific Question:   Preferred imaging location?    Answer:   [facility]    Order Specific Question:   Is Oral Contrast requested for this exam?    Answer:   Yes, Per Radiology protocol   All questions were answered. The patient knows to call the clinic with any problems, questions or concerns.  I have spent a total of 30 minutes minutes of face-to-face and non-face-to-face time, preparing to see the patient, obtaining and/or reviewing separately obtained history, performing a medically appropriate examination, counseling and educating the patient, ordering medications, referring and communicating with other health care professionals, documenting clinical information in the electronic health record, and care coordination.   Ledell Peoples, MD Department of Hematology/Oncology [place] at [facility] Phone: [phone] Pager: [phone] Email: [email or web address]

## 2021-04-06 NOTE — Progress Notes (Signed)
OK to proceed w/ Norma Morales today w/out a UP per Dr. Lorenso Courier

## 2021-04-06 NOTE — Progress Notes (Signed)
Per Dr. Lorenso Courier, [place] to speed up 5FU pump to infuse over 44 hours at a rate of 3.40 mL/hr.

## 2021-04-06 NOTE — Patient Instructions (Signed)
[facility] Morales ONCOLOGY  Discharge Instructions: Thank you for choosing [facility] to provide your oncology and hematology care.   If you have a lab appointment with the [facility], please go directly to the [facility] and check in at the registration area.   Wear comfortable clothing and clothing appropriate for easy access to any Portacath or PICC line.   We strive to give you quality time with your provider. You may need to reschedule your appointment if you arrive late (15 or more minutes).  Arriving late affects you and other patients whose appointments are after yours.  Also, if you miss three or more appointments without notifying the office, you may be dismissed from the clinic at the providers discretion.      For prescription refill requests, have your pharmacy contact our office and allow 72 hours for refills to be completed.    Today you received the following chemotherapy and/or immunotherapy agents: Oxaliplatin/Leucovorin/Zirabev/Fluorouracil    To help prevent nausea and vomiting after your treatment, we encourage you to take your nausea medication as directed.  BELOW ARE SYMPTOMS THAT SHOULD BE REPORTED IMMEDIATELY: *FEVER GREATER THAN 100.4 F (38 C) OR HIGHER *CHILLS OR SWEATING *NAUSEA AND VOMITING THAT IS NOT CONTROLLED WITH YOUR NAUSEA MEDICATION *UNUSUAL SHORTNESS OF BREATH *UNUSUAL BRUISING OR BLEEDING *URINARY PROBLEMS (pain or burning when urinating, or frequent urination) *BOWEL PROBLEMS (unusual diarrhea, constipation, pain near the anus) TENDERNESS IN MOUTH AND THROAT WITH OR WITHOUT PRESENCE OF ULCERS (sore throat, sores in mouth, or a toothache) UNUSUAL RASH, SWELLING OR PAIN  UNUSUAL VAGINAL DISCHARGE OR ITCHING   Items with * indicate a potential emergency and should be followed up as soon as possible or go to the Emergency Department if any problems should occur.  Please show the CHEMOTHERAPY ALERT CARD or  IMMUNOTHERAPY ALERT CARD at check-in to the Emergency Department and triage nurse.  Should you have questions after your visit or need to cancel or reschedule your appointment, please contact [facility]  Dept: [phone]  and follow the prompts.  Office hours are 8:00 a.m. to 4:30 p.m. Monday - Friday. Please note that voicemails left after 4:00 p.m. may not be returned until the following business day.  We are closed weekends and major holidays. You have access to a nurse at all times for urgent questions. Please call the main number to the clinic Dept: [phone] and follow the prompts.   For any non-urgent questions, you may also contact your provider using MyChart. We now offer e-Visits for anyone 63 and older to request care online for non-urgent symptoms. For details visit [email or web address].   Also download the MyChart app! Go to the app store, search "MyChart", open the app, select [facility], and log in with your MyChart username and password.  Due to Covid, a mask is required upon entering the hospital/clinic. If you do not have a mask, one will be given to you upon arrival. For doctor visits, patients may have 1 support person aged 67 or older with them. For treatment visits, patients cannot have anyone with them due to current Covid guidelines and our immunocompromised population.   The chemotherapy medication bag should finish at 46 hours, 96 hours, or 7 days. For example, if your pump is scheduled for 46 hours and it was put on at 4:00 p.m., it should finish at 2:00 p.m. the day it is scheduled to come off regardless of your appointment time.  Estimated time to finish at 1230 on 04/08/21.   If the display on your pump reads "Low Volume" and it is beeping, take the batteries out of the pump and come to the cancer center for it to be taken off.   If the pump alarms go off prior to the pump reading "Low Volume" then call [phone] and  someone can assist you.  If the plunger comes out and the chemotherapy medication is leaking out, please use your home chemo spill kit to clean up the spill. Do NOT use paper towels or other household products.  If you have problems or questions regarding your pump, please call either [phone] (24 hours a day) or the cancer center Monday-Friday 8:00 a.m.- 4:30 p.m. at the clinic number and we will assist you. If you are unable to get assistance, then go to the nearest Emergency Department and ask the staff to contact the IV team for assistance.

## 2021-04-06 NOTE — Progress Notes (Signed)
Nutrition Follow-up:  Patient receiving FOLFOX + Bevacizumab for metastatic colon cancer.  Met with patient during infusion. She reports worsening cold sensitivity. Patient reports unable to tolerate "even slightly chilled" water yesterday. Her appetite is poor, reports she can go all day without eating. Patient reports she has been drinking 2 hot Ensure Complete so her husband will not worry about her so much. Patient reports she can increase this to 3 or possibly 4 if this will "sustain" her.   Medications: reviewed   Labs: glucose 108  Anthropometrics: Weight 126 lb 12.8 oz today decreased 5.4% in 2 weeks; weights declined 13.7% (20 lbs) in 3 months; significant   1/26 - 133 lb 14.4 oz 1/12 - 135 lb 6.4 oz 12/29 - 136 lb 14.4 oz  12/15 - 140 lb 4.8 oz 12/1 - 138 lb 3.7 oz  11/23 - 146 lb 9.6 oz 11/9 - 146 lb 4.8 oz   NUTRITION DIAGNOSIS: Unintended weight loss ongoing    INTERVENTION:  Encouraged small bites of high calorie, high protein foods frequently through out the day Continue drinking Ensure Complete, recommend 4-5/day - Ensure coupons provided  Suggested pt could drink 3 Ensure Complete plus one Boost VHC to provide 1580 kcal, 112 grams protein) Sample of similar oral supplement ([employer]) provided for pt to try as well as ordering information and coupons for Boost VHC     MONITORING, EVALUATION, GOAL: weight trends, intake    NEXT VISIT: Thursday February 23 during infusion

## 2021-04-08 ENCOUNTER — Inpatient Hospital Stay: Payer: Medicare Other

## 2021-04-08 ENCOUNTER — Other Ambulatory Visit: Payer: Self-pay

## 2021-04-08 VITALS — BP 134/63 | HR 94 | Temp 97.6°F | Resp 17

## 2021-04-08 DIAGNOSIS — C799 Secondary malignant neoplasm of unspecified site: Secondary | ICD-10-CM

## 2021-04-08 DIAGNOSIS — Z95828 Presence of other vascular implants and grafts: Secondary | ICD-10-CM

## 2021-04-08 DIAGNOSIS — Z5112 Encounter for antineoplastic immunotherapy: Secondary | ICD-10-CM | POA: Diagnosis not present

## 2021-04-08 MED ORDER — PEGFILGRASTIM-CBQV 6 MG/0.6ML ~~LOC~~ SOSY
6.0000 mg | PREFILLED_SYRINGE | Freq: Once | SUBCUTANEOUS | Status: AC
  Administered 2021-04-08: 6 mg via SUBCUTANEOUS
  Filled 2021-04-08: qty 0.6

## 2021-04-08 MED ORDER — SODIUM CHLORIDE 0.9% FLUSH
10.0000 mL | Freq: Once | INTRAVENOUS | Status: AC
  Administered 2021-04-08: 10 mL

## 2021-04-08 MED ORDER — HEPARIN SOD (PORK) LOCK FLUSH 100 UNIT/ML IV SOLN
500.0000 [IU] | Freq: Once | INTRAVENOUS | Status: AC
  Administered 2021-04-08: 500 [IU]

## 2021-04-15 ENCOUNTER — Other Ambulatory Visit: Payer: Self-pay | Admitting: Hematology and Oncology

## 2021-04-16 ENCOUNTER — Encounter: Payer: Self-pay | Admitting: Hematology and Oncology

## 2021-04-16 ENCOUNTER — Other Ambulatory Visit: Payer: Self-pay | Admitting: Hematology and Oncology

## 2021-04-17 ENCOUNTER — Other Ambulatory Visit: Payer: Self-pay | Admitting: Hematology and Oncology

## 2021-04-17 ENCOUNTER — Encounter: Payer: Self-pay | Admitting: Hematology and Oncology

## 2021-04-18 ENCOUNTER — Ambulatory Visit (HOSPITAL_COMMUNITY)
Admission: RE | Admit: 2021-04-18 | Discharge: 2021-04-18 | Disposition: A | Discharge status: Home / Self Care | Payer: Medicare Other | Source: Ambulatory Visit | Attending: Hematology and Oncology | Admitting: Hematology and Oncology

## 2021-04-18 ENCOUNTER — Ambulatory Visit (HOSPITAL_COMMUNITY): Payer: Medicare Other

## 2021-04-18 ENCOUNTER — Encounter (HOSPITAL_COMMUNITY): Payer: Self-pay

## 2021-04-18 ENCOUNTER — Other Ambulatory Visit: Payer: Self-pay

## 2021-04-18 DIAGNOSIS — C189 Malignant neoplasm of colon, unspecified: Secondary | ICD-10-CM | POA: Diagnosis not present

## 2021-04-18 DIAGNOSIS — C787 Secondary malignant neoplasm of liver and intrahepatic bile duct: Secondary | ICD-10-CM | POA: Insufficient documentation

## 2021-04-18 MED ORDER — SODIUM CHLORIDE (PF) 0.9 % IJ SOLN
INTRAMUSCULAR | Status: AC
  Filled 2021-04-18: qty 50

## 2021-04-18 MED ORDER — IOHEXOL 300 MG/ML  SOLN
100.0000 mL | Freq: Once | INTRAMUSCULAR | Status: AC | PRN
  Administered 2021-04-18: 100 mL via INTRAVENOUS

## 2021-04-20 ENCOUNTER — Inpatient Hospital Stay: Payer: Medicare Other

## 2021-04-20 ENCOUNTER — Other Ambulatory Visit: Payer: Self-pay

## 2021-04-20 ENCOUNTER — Inpatient Hospital Stay: Payer: Medicare Other | Admitting: Dietician

## 2021-04-20 ENCOUNTER — Inpatient Hospital Stay (HOSPITAL_BASED_OUTPATIENT_CLINIC_OR_DEPARTMENT_OTHER): Payer: Medicare Other | Admitting: Hematology and Oncology

## 2021-04-20 VITALS — BP 119/68 | HR 83 | Temp 97.1°F | Resp 16 | Wt 119.1 lb

## 2021-04-20 DIAGNOSIS — C189 Malignant neoplasm of colon, unspecified: Secondary | ICD-10-CM

## 2021-04-20 DIAGNOSIS — C787 Secondary malignant neoplasm of liver and intrahepatic bile duct: Secondary | ICD-10-CM

## 2021-04-20 DIAGNOSIS — Z95828 Presence of other vascular implants and grafts: Secondary | ICD-10-CM

## 2021-04-20 DIAGNOSIS — C799 Secondary malignant neoplasm of unspecified site: Secondary | ICD-10-CM | POA: Diagnosis not present

## 2021-04-20 DIAGNOSIS — Z5112 Encounter for antineoplastic immunotherapy: Secondary | ICD-10-CM | POA: Diagnosis not present

## 2021-04-20 LAB — CBC WITH DIFFERENTIAL (CANCER CENTER ONLY)
Abs Immature Granulocytes: 0.16 10*3/uL — ABNORMAL HIGH (ref 0.00–0.07)
Basophils Absolute: 0.1 10*3/uL (ref 0.0–0.1)
Basophils Relative: 0 %
Eosinophils Absolute: 0.1 10*3/uL (ref 0.0–0.5)
Eosinophils Relative: 0 %
HCT: 34.5 % — ABNORMAL LOW (ref 36.0–46.0)
Hemoglobin: 11.1 g/dL — ABNORMAL LOW (ref 12.0–15.0)
Immature Granulocytes: 1 %
Lymphocytes Relative: 7 %
Lymphs Abs: 1.2 10*3/uL (ref 0.7–4.0)
MCH: 24.7 pg — ABNORMAL LOW (ref 26.0–34.0)
MCHC: 32.2 g/dL (ref 30.0–36.0)
MCV: 76.8 fL — ABNORMAL LOW (ref 80.0–100.0)
Monocytes Absolute: 1.4 10*3/uL — ABNORMAL HIGH (ref 0.1–1.0)
Monocytes Relative: 8 %
Neutro Abs: 15.3 10*3/uL — ABNORMAL HIGH (ref 1.7–7.7)
Neutrophils Relative %: 84 %
Platelet Count: 216 10*3/uL (ref 150–400)
RBC: 4.49 MIL/uL (ref 3.87–5.11)
RDW: 25.8 % — ABNORMAL HIGH (ref 11.5–15.5)
WBC Count: 18.1 10*3/uL — ABNORMAL HIGH (ref 4.0–10.5)
nRBC: 0 % (ref 0.0–0.2)

## 2021-04-20 LAB — CMP (CANCER CENTER ONLY)
ALT: 12 U/L (ref 0–44)
AST: 16 U/L (ref 15–41)
Albumin: 2.9 g/dL — ABNORMAL LOW (ref 3.5–5.0)
Alkaline Phosphatase: 272 U/L — ABNORMAL HIGH (ref 38–126)
Anion gap: 9 (ref 5–15)
BUN: 14 mg/dL (ref 8–23)
CO2: 26 mmol/L (ref 22–32)
Calcium: 8.3 mg/dL — ABNORMAL LOW (ref 8.9–10.3)
Chloride: 99 mmol/L (ref 98–111)
Creatinine: 0.58 mg/dL (ref 0.44–1.00)
GFR, Estimated: >60 mL/min (ref >60)
Glucose, Bld: 118 mg/dL — ABNORMAL HIGH (ref 70–99)
Potassium: 3 mmol/L — ABNORMAL LOW (ref 3.5–5.1)
Sodium: 134 mmol/L — ABNORMAL LOW (ref 135–145)
Total Bilirubin: 0.4 mg/dL (ref 0.3–1.2)
Total Protein: 5.4 g/dL — ABNORMAL LOW (ref 6.5–8.1)

## 2021-04-20 LAB — CEA (IN HOUSE-CHCC): CEA (CHCC-In House): 16.01 ng/mL — ABNORMAL HIGH (ref 0.00–5.00)

## 2021-04-20 MED ORDER — CIPROFLOXACIN HCL 500 MG PO TABS: 500.0000 mg | ORAL_TABLET | Freq: Two times a day (BID) | ORAL | 0 refills | Status: AC

## 2021-04-20 MED ORDER — SODIUM CHLORIDE 0.9% FLUSH
10.0000 mL | Freq: Once | INTRAVENOUS | Status: AC
  Administered 2021-04-20: 10 mL via INTRAVENOUS

## 2021-04-20 MED ORDER — HEPARIN SOD (PORK) LOCK FLUSH 100 UNIT/ML IV SOLN
500.0000 [IU] | Freq: Once | INTRAVENOUS | Status: AC
  Administered 2021-04-20: 500 [IU] via INTRAVENOUS

## 2021-04-20 MED ORDER — SODIUM CHLORIDE 0.9% FLUSH
10.0000 mL | Freq: Once | INTRAVENOUS | Status: AC
  Administered 2021-04-20: 10 mL

## 2021-04-20 MED ORDER — METRONIDAZOLE 500 MG PO TABS: 500.0000 mg | ORAL_TABLET | Freq: Three times a day (TID) | ORAL | 0 refills | Status: AC

## 2021-04-20 NOTE — Progress Notes (Signed)
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    2) 12/28/2020: CT abdomen/pelvis: Short segment colonic wall thickening at the hepatic flexure with pericolonic stranding and nodularity, concerning for primary colonic neoplasm. Recommend correlation with colonoscopy.  Findings of metastatic disease including multifocal omental, mesenteric, and peritoneal/retroperitoneal soft tissue implants, portocaval and retroperitoneal lymphadenopathy and multiple hepatic metastases. Question lucent lesion involving the S2 vertebral body with soft tissue density extending into the adjacent canal and partially expanded left S2 neural foramina. Recommend dedicated MRI of the sacrum for further characterization. Subpleural 0.7 cm nodule in the lingula is indeterminate. Further evaluation with dedicated CT chest is recommended.  A 1.0 cm lesion in the pancreatic neck without pancreatic ductal dilatation. This could be further characterized with MRI of the abdomen.   3) 01/04/2021: Establish care at [facility].   4) 01/06/2021: Underwent colonoscopy with Dr. Kathreen Devoid [facility]).  Findings revealed a circumferential, near obstructing colon mass concerning for malignancy.  Pathology revealed superficial fragments of colonic mucosa with extensive high-grade dysplasia.  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  No suspicious osseous lesion.  6) 01/11/2021: CT chest: Multiple small pulmonary densities in the bilateral lower lobes of indeterminate etiology.  Ill-defined hypodense lesion in the liver measuring at least 5.5  x 4.0 cm as well as few small hypodense lesions concerning for malignant process.  No mediastinal lymphadenopathy.  No suspicious osseous lesions  7) 01/17/2021: Underwent port placement and liver biopsy.  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  8) 01/26/2021: intended start of FOLFOX +Bevacizumab chemotherapy. Held due to Bilirubin 9.0, AST 252, ALT 282. Admitted to [facility] hospital.   9) 02/09/2021: Cycle 1 Day 1 of FOLFOX + Bevacizumab  10) 02/23/2021: Cycle 2 Day 1 of FOLFOX + Bevacizumab  11) 03/09/2021: Cycle 3 Day 1 of FOLFOX + Bevacizumab  12) 03/23/2021: Cycle 4 Day 1 of FOLFOX + Bevacizumab  13) 04/06/2021: Cycle 5 Day 1 of FOLFOX + Bevacizumab 14) 04/18/2021: CT chest abdomen pelvis shows concern for progression of disease.  Interval History:  Norma Morales returns for follow-up for metastatic colon cancer.  Her last visit was on 04/06/2021 for cycle 5 of FOLFOX.  She presents today for cycle 6 of treatment.  On exam today, [name] is accompanied by her husband and daughters.  She notes she has been feeling very unwell in the interim since her last visit.  She has been having nausea, vomiting, and diarrhea.  This is mostly been over the last 2 weeks but worsened over the last week.  She notes the diarrhea is quite watery and profuse and occurs about 4-6 times per day.  She notes that she is "spurting water".  She notes that she has not been having any frank abdominal pain.  She is also not having any fevers, chills, sweats.  A full 10 point ROS is listed below.  She is willing and able to proceed with chemotherapy treatment today.  The bulk of our visit focused on the results of the CT scan which unfortunately showed colitis as well as progression of disease.  The patient was eager to discontinue chemotherapy and noted  that she may not want to pursue another line of treatment.  MEDICAL HISTORY:  Past Medical History:  Diagnosis Date   Hyperlipidemia     met colon ca to liver 12/2020   colon    SURGICAL HISTORY: Past Surgical History:  Procedure Laterality Date   BILIARY STENT PLACEMENT N/A 01/27/2021   Procedure: BILIARY STENT PLACEMENT;  Surgeon: Clarene Essex, MD;  Location: [facility];  Service: Endoscopy;  Laterality: N/A;   ERCP N/A 01/27/2021   Procedure: ENDOSCOPIC RETROGRADE CHOLANGIOPANCREATOGRAPHY (ERCP);  Surgeon: Clarene Essex, MD;  Location: [facility];  Service: Endoscopy;  Laterality: N/A;   IR IMAGING GUIDED PORT INSERTION  01/17/2021   IR US GUIDE BX ASP/DRAIN  01/17/2021   SPHINCTEROTOMY  01/27/2021   Procedure: Joan Mayans;  Surgeon: Clarene Essex, MD;  Location: [facility];  Service: Endoscopy;;   TONSILLECTOMY      SOCIAL HISTORY: Social History   Socioeconomic History   Marital status: Married    Spouse name: Not on file   Number of children: Not on file   Years of education: Not on file   Highest education level: Not on file  Occupational History   Not on file  Tobacco Use   Smoking status: Never   Smokeless tobacco: Never  Substance and Sexual Activity   Alcohol use: Never   Drug use: Never   Sexual activity: Not on file  Other Topics Concern   Not on file  Social History Narrative   Not on file   Social Determinants of Health   Financial Resource Strain: Not on file  Food Insecurity: Not on file  Transportation Needs: Not on file  Physical Activity: Not on file  Stress: Not on file  Social Connections: Not on file  Intimate Partner Violence: Not on file    FAMILY HISTORY: No family history on file.  ALLERGIES:  is allergic to aspirin.  MEDICATIONS:  Current Outpatient Medications  Medication Sig Dispense Refill   ciprofloxacin (CIPRO) 500 MG tablet Take 1 tablet (500 mg total) by mouth 2 (two) times daily for 7 days. 14 tablet 0   loperamide (IMODIUM) 2 MG capsule Take 2 mg by mouth as needed for diarrhea or loose stools.     metroNIDAZOLE (FLAGYL) 500 MG tablet Take 1 tablet  (500 mg total) by mouth 3 (three) times daily for 7 days. 21 tablet 0   alendronate (FOSAMAX) 70 MG tablet Take 70 mg by mouth every Sunday.     atenolol (TENORMIN) 25 MG tablet Take 25 mg by mouth at bedtime.     ezetimibe (ZETIA) 10 MG tablet Take 10 mg by mouth at bedtime.     KLOR-CON M20 20 MEQ tablet TAKE 1 TABLET (20 MEQ TOTAL) BY MOUTH ONCE FOR 1 DOSE. 30 tablet 1   loratadine (CLARITIN) 10 MG tablet Take 10 mg by mouth daily. For 5-7 days after neulasta injection     NON FORMULARY Take 30 mLs by mouth See admin instructions. Source of Life Gold Multivitamin Liquid-/Healthy Immune System & Well-Being (includes Vitamins D3, B12, K2 & Over 120 Whole Food Nutrients)- Drink 30 ml's by mouth once a day     ondansetron (ZOFRAN) 8 MG tablet Take 1 tablet (8 mg total) by mouth every 8 (eight) hours as needed for nausea or vomiting. 60 tablet 3   oxyCODONE (ROXICODONE) 5 MG immediate release tablet Take 1 tablet (5 mg total) by mouth every 8 (eight) hours as needed. 90 tablet 0   prochlorperazine (COMPAZINE) 10  MG tablet Take 1 tablet (10 mg total) by mouth every 6 (six) hours as needed for nausea or vomiting. 60 tablet 3   senna (SENOKOT) 8.6 MG TABS tablet Take 1 tablet (8.6 mg total) by mouth daily as needed for mild constipation. 30 tablet 0   No current facility-administered medications for this visit.    REVIEW OF SYSTEMS:   Constitutional: ( - ) fevers, ( - )  chills , ( - ) night sweats Eyes: ( - ) blurriness of vision, ( - ) double vision, ( - ) watery eyes Ears, nose, mouth, throat, and face: ( - ) mucositis, ( - ) sore throat Respiratory: ( - ) cough, ( - ) dyspnea, ( - ) wheezes Cardiovascular: ( - ) palpitation, ( - ) chest discomfort, ( - ) lower extremity swelling Gastrointestinal:  ( - ) nausea, ( - ) heartburn, ( - ) change in bowel habits Skin: ( - ) abnormal skin rashes Lymphatics: ( - ) new lymphadenopathy, ( - ) easy bruising Neurological: ( - ) numbness, ( - ) tingling, (  - ) new weaknesses Behavioral/Psych: ( - ) mood change, ( - ) new changes  All other systems were reviewed with the patient and are negative.  PHYSICAL EXAMINATION: ECOG PERFORMANCE STATUS: 1 - Symptomatic but completely ambulatory  Vitals:   04/20/21 1151  BP: 119/68  Pulse: 83  Resp: 16  Temp: (!) 97.1 F (36.2 C)  SpO2: 100%    Filed Weights   04/20/21 1151  Weight: 119 lb 1.6 oz (54 kg)    GENERAL: well appearing Caucasian female in NAD  SKIN:  rashes or significant lesions. Jaundice.  EYES: conjunctiva are pink and non-injected, sclera icteric LUNGS: clear to auscultation and percussion with normal breathing effort HEART: regular rate & rhythm and no murmurs and no lower extremity edema Musculoskeletal: no cyanosis of digits and no clubbing  PSYCH: alert & oriented x 3, fluent speech NEURO: no focal motor/sensory deficits  LABORATORY DATA:  I have reviewed the data as listed CBC Latest Ref Rng & Units 04/20/2021 04/06/2021 03/23/2021  WBC 4.0 - 10.5 K/uL 18.1(H) 13.3(H) 11.2(H)  Hemoglobin 12.0 - 15.0 g/dL 11.1(L) 10.2(L) 9.6(L)  Hematocrit 36.0 - 46.0 % 34.5(L) 32.4(L) 30.6(L)  Platelets 150 - 400 K/uL 216 227 191    CMP Latest Ref Rng & Units 04/20/2021 04/06/2021 03/23/2021  Glucose 70 - 99 mg/dL 118(H) 108(H) 112(H)  BUN 8 - 23 mg/dL $Remove'14 10 8  'silphsF$ Creatinine 0.44 - 1.00 mg/dL 0.58 0.54 0.46  Sodium 135 - 145 mmol/L 134(L) 136 135  Potassium 3.5 - 5.1 mmol/L 3.0(L) 3.8 3.4(L)  Chloride 98 - 111 mmol/L 99 102 101  CO2 22 - 32 mmol/L $RemoveB'26 29 29  'JvGKCVli$ Calcium 8.9 - 10.3 mg/dL 8.3(L) 8.1(L) 8.4(L)  Total Protein 6.5 - 8.1 g/dL 5.4(L) 5.3(L) 5.8(L)  Total Bilirubin 0.3 - 1.2 mg/dL 0.4 0.4 0.5  Alkaline Phos 38 - 126 U/L 272(H) 244(H) 183(H)  AST 15 - 41 U/L $Remo'16 19 22  'CQWAT$ ALT 0 - 44 U/L $Remo'12 11 12    'FbcGS$ PATHOLOGY: SURGICAL PATHOLOGY  CASE: [id number]  A. LIVER, RIGHT, MASS, BIOPSY:  - Metastatic adenocarcinoma consistent with colorectal origin.   COMMENT:  Sections demonstrate  metastatic moderately differentiated adenocarcinoma  with dirty necrosis and extracellular mucin involving hepatic  parenchyma. A limited panel of immunohistochemical stains was performed  and the adenocarcinoma is positive for cytokeratin 20 and CDX-2 while  negative for cytokeratin 7. The  morphologic findings in conjunction with  the pattern of immunohistochemical staining is consistent with  metastatic adenocarcinoma of colorectal origin.   RADIOGRAPHIC STUDIES: I have personally reviewed the radiological images as listed and agreed with the findings in the report. No results found.  ASSESSMENT & PLAN Tamecca Artiga is a 76 year old female who returns for follow-up for metastatic colon cancer.    The mainstay treatment will be chemotherapy with the rationale to prolong life and help improve cancer related symptoms.  This is unfortunately not a curable disease.  The treatment regimen includes infusional 5-FU, oxaliplatin and bevacizumab given once every 2 weeks.  The dose, frequency and common side effects were discussed with the patient.  Common side effects include fatigue, appetite loss, nausea, diarrhea, cold sensitivity, neuropathy, poor wound healing and increased risk of bleeding.  We will plan to obtain repeat imaging in 2 to 3 months to assess treatment response.  Additionally, we have additional biomarkers including KRAS WT, NRAS WT, BRAF mutation positive and MSS.   # Colitis- acute -- Patient has been having severe nausea, vomiting, and diarrhea in the interim since her last visit.  Has been particularly bad in the last week --CT imaging shows concern for colitis --We will provide patient with brief course of Cipro and Flagyl in order to treat the colitis --Encourage hydration. --Recommend avoiding Imodium in the interim --We will have patient return to clinic in 2 weeks to reassess.  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 --CT scan on 04/18/2021 showed concern for progression of disease. Plan: --HOLD FOLFOX plus Bevacizumab -- CT scan unfortunately shows progression of disease. --Patient would like time to consider whether or not she wants to proceed with further chemotherapy versus comfort based care --RTC in 2 weeks to assure she is recovering and to determine next course of action.  # Elevated Bilirubin/LFTs, normalized -- Patient was admitted and underwent ERCP with biliary stent placement. --Solid improvement in her LFTs. Currently normalized with elevated Alk phos at 244.  --Okay to proceed with chemotherapy today.  #RUQ abdominal pain: --Likely secondary to liver metastases --Currently on oxycodone 5 mg q 8 hours with improvement of pain. Refill sent.  --Advised patient to be aggressive with her bowel regimen to minimize constipation.   #Supportive Care -- chemotherapy education complete -- port placed -- zofran 8mg  q8H PRN and compazine 10mg  PO q6H for nausea -- EMLA cream for port -- oxycodone 24m PO q8H PRN for pain control (as above)  No orders of the defined types were placed in this encounter.  All questions were answered. The patient knows to call the clinic with any problems, questions or concerns.  I have spent a total of 30 minutes minutes of face-to-face and non-face-to-face time, preparing to see the patient, obtaining and/or reviewing separately obtained history, performing a medically appropriate examination, counseling and educating the patient, ordering medications, referring and communicating with other health care professionals, documenting clinical information in the electronic health record, and  care coordination.   Ledell Peoples, MD Department of Hematology/Oncology [place] [facility] Phone: [phone] Pager: [phone] Email: [email or web address]

## 2021-04-20 NOTE — Progress Notes (Signed)
Spoke with infusion RN. Patient did not receive treatment today. Will reschedule nutrition follow-up as able.

## 2021-04-22 ENCOUNTER — Inpatient Hospital Stay: Payer: Medicare Other

## 2021-04-28 ENCOUNTER — Telehealth: Payer: Self-pay | Admitting: *Deleted

## 2021-04-28 NOTE — Telephone Encounter (Signed)
Received call from pt. She states she needs a refill of her oxycodone, last filled on 03/27/21 for # 90 tabs. ?Pt states her diarrhea has resolved but now she hasn't had a BM in 5 days. Sghe took 1 senna s last night and can feel her stomach cramping but no BM. Advised that as she takes pain meds and colitis seems to be resolved, she needs to increase her senna s. Advised to take another one now and if ni BM later in the day, she should take 1-2 tonight and continue with nightly senna. Advised to call back if no relief. Advised to increase her fluid intake as well ?

## 2021-04-29 ENCOUNTER — Other Ambulatory Visit: Payer: Self-pay | Admitting: Oncology

## 2021-04-29 MED ORDER — OXYCODONE HCL 5 MG PO TABS: 5.0000 mg | ORAL_TABLET | Freq: Three times a day (TID) | ORAL | 0 refills | Status: AC | PRN

## 2021-05-02 ENCOUNTER — Inpatient Hospital Stay: Payer: Medicare Other | Attending: Physician Assistant

## 2021-05-02 ENCOUNTER — Inpatient Hospital Stay: Payer: Medicare Other | Admitting: Nutrition

## 2021-05-02 ENCOUNTER — Other Ambulatory Visit: Payer: Self-pay

## 2021-05-02 ENCOUNTER — Inpatient Hospital Stay (HOSPITAL_BASED_OUTPATIENT_CLINIC_OR_DEPARTMENT_OTHER): Payer: Medicare Other | Admitting: Hematology and Oncology

## 2021-05-02 ENCOUNTER — Inpatient Hospital Stay: Payer: Medicare Other

## 2021-05-02 VITALS — BP 100/54 | HR 66 | Temp 98.0°F | Resp 16 | Wt 116.9 lb

## 2021-05-02 VITALS — BP 95/48 | HR 55 | Resp 16

## 2021-05-02 DIAGNOSIS — R112 Nausea with vomiting, unspecified: Secondary | ICD-10-CM

## 2021-05-02 DIAGNOSIS — C189 Malignant neoplasm of colon, unspecified: Secondary | ICD-10-CM | POA: Diagnosis not present

## 2021-05-02 DIAGNOSIS — K5900 Constipation, unspecified: Secondary | ICD-10-CM | POA: Diagnosis not present

## 2021-05-02 DIAGNOSIS — C799 Secondary malignant neoplasm of unspecified site: Secondary | ICD-10-CM | POA: Diagnosis not present

## 2021-05-02 DIAGNOSIS — Z95828 Presence of other vascular implants and grafts: Secondary | ICD-10-CM

## 2021-05-02 DIAGNOSIS — C787 Secondary malignant neoplasm of liver and intrahepatic bile duct: Secondary | ICD-10-CM | POA: Diagnosis not present

## 2021-05-02 DIAGNOSIS — R1011 Right upper quadrant pain: Secondary | ICD-10-CM | POA: Diagnosis not present

## 2021-05-02 DIAGNOSIS — C183 Malignant neoplasm of hepatic flexure: Secondary | ICD-10-CM | POA: Diagnosis not present

## 2021-05-02 DIAGNOSIS — R748 Abnormal levels of other serum enzymes: Secondary | ICD-10-CM | POA: Insufficient documentation

## 2021-05-02 LAB — CMP (CANCER CENTER ONLY)
ALT: 12 U/L (ref 0–44)
AST: 29 U/L (ref 15–41)
Albumin: 2.9 g/dL — ABNORMAL LOW (ref 3.5–5.0)
Alkaline Phosphatase: 467 U/L — ABNORMAL HIGH (ref 38–126)
Anion gap: 6 (ref 5–15)
BUN: 23 mg/dL (ref 8–23)
CO2: 31 mmol/L (ref 22–32)
Calcium: 8.7 mg/dL — ABNORMAL LOW (ref 8.9–10.3)
Chloride: 93 mmol/L — ABNORMAL LOW (ref 98–111)
Creatinine: 1.1 mg/dL — ABNORMAL HIGH (ref 0.44–1.00)
GFR, Estimated: 53 mL/min — ABNORMAL LOW (ref >60)
Glucose, Bld: 76 mg/dL (ref 70–99)
Potassium: 3.9 mmol/L (ref 3.5–5.1)
Sodium: 130 mmol/L — ABNORMAL LOW (ref 135–145)
Total Bilirubin: 0.9 mg/dL (ref 0.3–1.2)
Total Protein: 5.6 g/dL — ABNORMAL LOW (ref 6.5–8.1)

## 2021-05-02 LAB — CBC WITH DIFFERENTIAL (CANCER CENTER ONLY)
Abs Immature Granulocytes: 0.09 10*3/uL — ABNORMAL HIGH (ref 0.00–0.07)
Basophils Absolute: 0.1 10*3/uL (ref 0.0–0.1)
Basophils Relative: 1 %
Eosinophils Absolute: 0.1 10*3/uL (ref 0.0–0.5)
Eosinophils Relative: 1 %
HCT: 32.8 % — ABNORMAL LOW (ref 36.0–46.0)
Hemoglobin: 10.6 g/dL — ABNORMAL LOW (ref 12.0–15.0)
Immature Granulocytes: 1 %
Lymphocytes Relative: 5 %
Lymphs Abs: 0.8 10*3/uL (ref 0.7–4.0)
MCH: 26 pg (ref 26.0–34.0)
MCHC: 32.3 g/dL (ref 30.0–36.0)
MCV: 80.4 fL (ref 80.0–100.0)
Monocytes Absolute: 1.6 10*3/uL — ABNORMAL HIGH (ref 0.1–1.0)
Monocytes Relative: 11 %
Neutro Abs: 12.8 10*3/uL — ABNORMAL HIGH (ref 1.7–7.7)
Neutrophils Relative %: 81 %
Platelet Count: 314 10*3/uL (ref 150–400)
RBC: 4.08 MIL/uL (ref 3.87–5.11)
RDW: 28.1 % — ABNORMAL HIGH (ref 11.5–15.5)
WBC Count: 15.5 10*3/uL — ABNORMAL HIGH (ref 4.0–10.5)
nRBC: 0 % (ref 0.0–0.2)

## 2021-05-02 MED ORDER — SODIUM CHLORIDE 0.9 % IV SOLN: 8.0000 mg | Freq: Once | INTRAVENOUS | Status: DC

## 2021-05-02 MED ORDER — HEPARIN SOD (PORK) LOCK FLUSH 100 UNIT/ML IV SOLN: 500.0000 [IU] | Freq: Once | INTRAVENOUS | Status: DC

## 2021-05-02 MED ORDER — HEPARIN SOD (PORK) LOCK FLUSH 100 UNIT/ML IV SOLN
500.0000 [IU] | Freq: Once | INTRAVENOUS | Status: AC
  Administered 2021-05-02: 500 [IU] via INTRAVENOUS

## 2021-05-02 MED ORDER — SODIUM CHLORIDE 0.9% FLUSH
10.0000 mL | Freq: Once | INTRAVENOUS | Status: AC
  Administered 2021-05-02: 10 mL via INTRAVENOUS

## 2021-05-02 MED ORDER — ONDANSETRON HCL 4 MG/2ML IJ SOLN
8.0000 mg | Freq: Once | INTRAMUSCULAR | Status: AC
  Administered 2021-05-02: 8 mg via INTRAVENOUS
  Filled 2021-05-02: qty 4

## 2021-05-02 MED ORDER — SODIUM CHLORIDE 0.9 % IV SOLN: Freq: Once | INTRAVENOUS | Status: AC

## 2021-05-02 MED ORDER — HEPARIN SOD (PORK) LOCK FLUSH 100 UNIT/ML IV SOLN
500.0000 [IU] | Freq: Once | INTRAVENOUS | Status: AC
  Administered 2021-05-02: 500 [IU]

## 2021-05-02 MED ORDER — SODIUM CHLORIDE 0.9% FLUSH
10.0000 mL | Freq: Once | INTRAVENOUS | Status: AC
  Administered 2021-05-02: 10 mL

## 2021-05-02 NOTE — Progress Notes (Signed)
Brief nutrition follow up completed with patient and family. Patient diagnosed with metastatic colon cancer. Treatment has been on hold due to progression of cancer on her last CT scan. ? ?Patient has been unable to eat or drink much. She is weak and has low energy. She has been having nausea, vomiting and constipation. She was previously drinking Ensure Complete and likes the chocolate flavor. She does not have any more at home. ? ?Weight decreased to 116.9 pounds from 126 pounds on Feb 9. ?Labs reviewed. ? ?Hospice referral was placed today. ? ?Nutrition Diagnosis of unintended weight loss continues. ? ?Intervention: ?Provided additional samples of Ensure Plus and Ensure Complete at family request. Also provided coupons. ?Encouraged family to contact me if they need additional samples or coupons.  ?No further appointments scheduled. RD available as needed. ? ?

## 2021-05-02 NOTE — Progress Notes (Signed)
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    2) 12/28/2020: CT abdomen/pelvis: Short segment colonic wall thickening at the hepatic flexure with pericolonic stranding and nodularity, concerning for primary colonic neoplasm. Recommend correlation with colonoscopy.  Findings of metastatic disease including multifocal omental, mesenteric, and peritoneal/retroperitoneal soft tissue implants, portocaval and retroperitoneal lymphadenopathy and multiple hepatic metastases. Question lucent lesion involving the S2 vertebral body with soft tissue density extending into the adjacent canal and partially expanded left S2 neural foramina. Recommend dedicated MRI of the sacrum for further characterization. Subpleural 0.7 cm nodule in the lingula is indeterminate. Further evaluation with dedicated CT chest is recommended.  A 1.0 cm lesion in the pancreatic neck without pancreatic ductal dilatation. This could be further characterized with MRI of the abdomen.   3) 01/04/2021: Establish care at [facility].   4) 01/06/2021: Underwent colonoscopy with Dr. Kathreen Devoid [facility]).  Findings revealed a circumferential, near obstructing colon mass concerning for malignancy.  Pathology revealed superficial fragments of colonic mucosa with extensive high-grade dysplasia.  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  No suspicious osseous lesion.  6) 01/11/2021: CT chest: Multiple small pulmonary densities in the bilateral lower lobes of indeterminate etiology.  Ill-defined hypodense lesion in the liver measuring at least 5.5  x 4.0 cm as well as few small hypodense lesions concerning for malignant process.  No mediastinal lymphadenopathy.  No suspicious osseous lesions  7) 01/17/2021: Underwent port placement and liver biopsy.  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  8) 01/26/2021: intended start of FOLFOX +Bevacizumab chemotherapy. Held due to Bilirubin 9.0, AST 252, ALT 282. Admitted to [facility] hospital.   9) 02/09/2021: Cycle 1 Day 1 of FOLFOX + Bevacizumab  10) 02/23/2021: Cycle 2 Day 1 of FOLFOX + Bevacizumab  11) 03/09/2021: Cycle 3 Day 1 of FOLFOX + Bevacizumab  12) 03/23/2021: Cycle 4 Day 1 of FOLFOX + Bevacizumab  13) 04/06/2021: Cycle 5 Day 1 of FOLFOX + Bevacizumab 14) 04/18/2021: CT chest abdomen pelvis shows concern for progression of disease.  Interval History:  Norma Morales returns for follow-up for metastatic colon cancer.  Her last visit was on 04/20/2020.  Since that time treatment has been held due to progression of cancer on her last CT scan.  On exam today, Norma Morales is accompanied by her husband and daughters.  She notes she has been doing very poorly and feeling "lousy" in the interim since her last visit.  She notes that she is becoming weak and her energy levels are extremely low.  She is having difficulty walking even with support.  She notes that she has been having severe episodes of vomiting and that is consistently brown vomitus.  She notes that she she is able to keep down water on occasion but for the most part has been having a lot of difficulty with p.o. intake.  Her Zofran and Compazine have not been helping.  She notes that unfortunately her constipation is flaring again as well.   She has completed the antibiotics for colitis and the diarrhea has resolved, but unfortunately she is now back to having constipation, cramps, and no bowel movements.  She is been taking Senokot as with little to no movement and is weary about taking MiraLAX due to her nausea  and vomiting.  She is also not having any fevers, chills, sweats.  A full 10 point ROS is listed below.  She is willing and able to proceed with chemotherapy treatment today.  The bulk of our visit focused on the need to establish with hospice care and our concern that she is having rapid decline.  She and her family are agreeable to establishing with hospice care.  We began the process today.  MEDICAL HISTORY:  Past Medical History:  Diagnosis Date   Hyperlipidemia    met colon ca to liver 12/2020   colon    SURGICAL HISTORY: Past Surgical History:  Procedure Laterality Date   BILIARY STENT PLACEMENT N/A 01/27/2021   Procedure: BILIARY STENT PLACEMENT;  Surgeon: Clarene Essex, MD;  Location: [facility];  Service: Endoscopy;  Laterality: N/A;   ERCP N/A 01/27/2021   Procedure: ENDOSCOPIC RETROGRADE CHOLANGIOPANCREATOGRAPHY (ERCP);  Surgeon: Clarene Essex, MD;  Location: [facility];  Service: Endoscopy;  Laterality: N/A;   IR IMAGING GUIDED PORT INSERTION  01/17/2021   IR US GUIDE BX ASP/DRAIN  01/17/2021   SPHINCTEROTOMY  01/27/2021   Procedure: Joan Mayans;  Surgeon: Clarene Essex, MD;  Location: [facility];  Service: Endoscopy;;   TONSILLECTOMY      SOCIAL HISTORY: Social History   Socioeconomic History   Marital status: Married    Spouse name: Not on file   Number of children: Not on file   Years of education: Not on file   Highest education level: Not on file  Occupational History   Not on file  Tobacco Use   Smoking status: Never   Smokeless tobacco: Never  Substance and Sexual Activity   Alcohol use: Never   Drug use: Never   Sexual activity: Not on file  Other Topics Concern   Not on file  Social History Narrative   Not on file   Social Determinants of Health   Financial Resource Strain: Not on file  Food Insecurity: Not on file  Transportation Needs: Not on file  Physical Activity: Not on file  Stress: Not on file  Social Connections: Not on file   Intimate Partner Violence: Not on file    FAMILY HISTORY: No family history on file.  ALLERGIES:  is allergic to aspirin.  MEDICATIONS:  Current Outpatient Medications  Medication Sig Dispense Refill   alendronate (FOSAMAX) 70 MG tablet Take 70 mg by mouth every Sunday.     atenolol (TENORMIN) 25 MG tablet Take 25 mg by mouth at bedtime.     ezetimibe (ZETIA) 10 MG tablet Take 10 mg by mouth at bedtime.     KLOR-CON M20 20 MEQ tablet TAKE 1 TABLET (20 MEQ TOTAL) BY MOUTH ONCE FOR 1 DOSE. 30 tablet 1   loperamide (IMODIUM) 2 MG capsule Take 2 mg by mouth as needed for diarrhea or loose stools.     loratadine (CLARITIN) 10 MG tablet Take 10 mg by mouth daily. For 5-7 days after neulasta injection     NON FORMULARY Take 30 mLs by mouth See admin instructions. Source of Life Gold Multivitamin Liquid-/Healthy Immune System & Well-Being (includes Vitamins D3, B12, K2 & Over 120 Whole Food Nutrients)- Drink 30 ml's by mouth once a day     ondansetron (ZOFRAN) 8 MG tablet Take 1 tablet (8 mg total) by mouth every 8 (eight) hours as needed for nausea or  vomiting. 60 tablet 3   oxyCODONE (ROXICODONE) 5 MG immediate release tablet Take 1 tablet (5 mg total) by mouth every 8 (eight) hours as needed. 60 tablet 0   prochlorperazine (COMPAZINE) 10 MG tablet Take 1 tablet (10 mg total) by mouth every 6 (six) hours as needed for nausea or vomiting. 60 tablet 3   senna (SENOKOT) 8.6 MG TABS tablet Take 1 tablet (8.6 mg total) by mouth daily as needed for mild constipation. 30 tablet 0   No current facility-administered medications for this visit.   Facility-Administered Medications Ordered in Other Visits  Medication Dose Route Frequency Provider Last Rate Last Admin   0.9 %  sodium chloride infusion   Intravenous Once Orson Slick, MD 500 mL/hr at 05/02/21 1241 New Bag at 05/02/21 1241    REVIEW OF SYSTEMS:   Constitutional: ( - ) fevers, ( - )  chills , ( - ) night sweats Eyes: ( - )  blurriness of vision, ( - ) double vision, ( - ) watery eyes Ears, nose, mouth, throat, and face: ( - ) mucositis, ( - ) sore throat Respiratory: ( - ) cough, ( - ) dyspnea, ( - ) wheezes Cardiovascular: ( - ) palpitation, ( - ) chest discomfort, ( - ) lower extremity swelling Gastrointestinal:  ( - ) nausea, ( - ) heartburn, ( - ) change in bowel habits Skin: ( - ) abnormal skin rashes Lymphatics: ( - ) new lymphadenopathy, ( - ) easy bruising Neurological: ( - ) numbness, ( - ) tingling, ( - ) new weaknesses Behavioral/Psych: ( - ) mood change, ( - ) new changes  All other systems were reviewed with the patient and are negative.  PHYSICAL EXAMINATION: ECOG PERFORMANCE STATUS: 1 - Symptomatic but completely ambulatory  Vitals:   05/02/21 1112  BP: (!) 100/54  Pulse: 66  Resp: 16  Temp: 98 F (36.7 C)  SpO2: 96%    Filed Weights   05/02/21 1112  Weight: 116 lb 14.4 oz (53 kg)    GENERAL: well appearing Caucasian female in NAD  SKIN:  rashes or significant lesions. Jaundice.  EYES: conjunctiva are pink and non-injected, sclera icteric LUNGS: clear to auscultation and percussion with normal breathing effort HEART: regular rate & rhythm and no murmurs and no lower extremity edema Musculoskeletal: no cyanosis of digits and no clubbing  PSYCH: alert & oriented x 3, fluent speech NEURO: no focal motor/sensory deficits  LABORATORY DATA:  I have reviewed the data as listed CBC Latest Ref Rng & Units 05/02/2021 04/20/2021 04/06/2021  WBC 4.0 - 10.5 K/uL 15.5(H) 18.1(H) 13.3(H)  Hemoglobin 12.0 - 15.0 g/dL 10.6(L) 11.1(L) 10.2(L)  Hematocrit 36.0 - 46.0 % 32.8(L) 34.5(L) 32.4(L)  Platelets 150 - 400 K/uL 314 216 227    CMP Latest Ref Rng & Units 05/02/2021 04/20/2021 04/06/2021  Glucose 70 - 99 mg/dL 76 118(H) 108(H)  BUN 8 - 23 mg/dL _0 Creatinine 0.44 - 1.00 mg/dL 1.10(H) 0.58 0.54  Sodium 135 - 145 mmol/L 130(L) 134(L) 136  Potassium 3.5 - 5.1 mmol/L 3.9 3.0(L) 3.8   Chloride 98 - 111 mmol/L 93(L) 99 102  CO2 22 - 32 mmol/L _1 Calcium 8.9 - 10.3 mg/dL 8.7(L) 8.3(L) 8.1(L)  Total Protein 6.5 - 8.1 g/dL 5.6(L) 5.4(L) 5.3(L)  Total Bilirubin 0.3 - 1.2 mg/dL 0.9 0.4 0.4  Alkaline Phos 38 - 126 U/L 467(H) 272(H) 244(H)  AST 15 - 41 U/L 29 16  19  ALT 0 - 44 U/L _0 PATHOLOGY: SURGICAL PATHOLOGY  CASE: [id number]  A. LIVER, RIGHT, MASS, BIOPSY:  - Metastatic adenocarcinoma consistent with colorectal origin.   COMMENT:  Sections demonstrate metastatic moderately differentiated adenocarcinoma  with dirty necrosis and extracellular mucin involving hepatic  parenchyma. A limited panel of immunohistochemical stains was performed  and the adenocarcinoma is positive for cytokeratin 20 and CDX-2 while  negative for cytokeratin 7. The morphologic findings in conjunction with  the pattern of immunohistochemical staining is consistent with  metastatic adenocarcinoma of colorectal origin.   RADIOGRAPHIC STUDIES: I have personally reviewed the radiological images as listed and agreed with the findings in the report. CT CHEST ABDOMEN PELVIS W CONTRAST  Result Date: 04/20/2021 CLINICAL DATA:  Colon cancer, assess treatment response in the setting of metastatic colorectal neoplasm. EXAM: CT CHEST, ABDOMEN, AND PELVIS WITH CONTRAST TECHNIQUE: Multidetector CT imaging of the chest, abdomen and pelvis was performed following the standard protocol during bolus administration of intravenous contrast. RADIATION DOSE REDUCTION: This exam was performed according to the departmental dose-optimization program which includes automated exposure control, adjustment of the mA and/or kV according to patient size and/or use of iterative reconstruction technique. CONTRAST:  165m OMNIPAQUE IOHEXOL 300 MG/ML  SOLN COMPARISON:  Comparison is made with January 18, 2021 and MRI of January 19, 2021. FINDINGS: CT CHEST FINDINGS Cardiovascular: Calcified and noncalcified  atheromatous plaque of the thoracic aorta. No aneurysmal dilation. Heart size is normal without signs of pericardial effusion. Central pulmonary vasculature is normal caliber. A small adherent thrombus is noted along the medial aspect of the patient's Port-A-Cath measuring approximately 1 cm greatest axial dimension (image 24/2) approximately 8 mm greatest craniocaudal dimension. Tip of the Port-A-Cath is in the RIGHT atrium. Clot is in the SVC. Mediastinum/Nodes: No thoracic inlet adenopathy. No axillary lymphadenopathy. Esophagus grossly normal. No hilar lymphadenopathy. Lungs/Pleura: Patchy nodules at the LEFT lung base without substantial change. Largest measuring up to 1 cm. (Image 121/6) Small nodule in the RIGHT lower lobe in the medial RIGHT lower lobe (image 129/6) likewise unchanged at 6 mm. The scattered nodules elsewhere again are stable and there is no sign of consolidation or evidence of pleural effusion. Airways are patent. Musculoskeletal: See below for full musculoskeletal details. CT ABDOMEN PELVIS FINDINGS Hepatobiliary: Enlarging more to necrotic appearing mass in the posterior RIGHT hepatic lobe (image 55/2) 5.3 x 5.6 cm. No gas within the lesion. Other low-density areas in the liver show similar slight enlargement, for instance in the posterior RIGHT hemi liver along the medial aspect of the RIGHT hemi liver a 3.6 by 1.6 cm area previously was composed of 2-3 discrete areas now confluent. This previously measured approximately 3.1 cm greatest axial dimension in total. No definite new lesions. Extensive pneumobilia with capacious appearing/dilated appearing biliary tree following stent placement. Metallic stent in the biliary tree. Dilation of the biliary tree is similar. Pancreas: Pancreas with mild atrophy, no signs of adjacent inflammation. Spleen: Normal. Adrenals/Urinary Tract: Adrenal glands are unremarkable. Symmetric renal enhancement. No sign of hydronephrosis. No suspicious renal  lesion or perinephric stranding. Urinary bladder is grossly unremarkable. Stomach/Bowel: No sign of bowel obstruction. Small bowel without signs of inflammation. The appendix is normal. Rectosigmoid thickening and mucosal hyperenhancement, generalized thickening with mural stratification less pronounced thickening is present throughout the remainder of the colon area from the distal transverse colon through the rectum is most affected. Mixed stool, gas and contrast in the ascending colon no signs of  pneumatosis. No pneumoperitoneum. Known colonic neoplasm is not as conspicuous as on previous imaging and nodularity about the hepatic flexure of the colon is improved and in most areas resolved (image 78/2) area measuring approximately 4 mm greatest thickness previously measured approximately 8 mm. Small ileocolic lymph node 9 mm on the current study (image 80/2) previously 9 mm but with less pronounced enhancement currently than on previous imaging. Vascular/Lymphatic: Aortic atherosclerosis. No sign of aneurysm. Smooth contour of the IVC. There is no gastrohepatic or hepatoduodenal ligament lymphadenopathy. No retroperitoneal or mesenteric lymphadenopathy. No pelvic sidewall lymphadenopathy. Reproductive: Unremarkable. Other: No ascites. Musculoskeletal: No acute bone finding. No destructive bone process. Spinal degenerative changes. IMPRESSION: 1. Signs of colitis more pronounced in the mid to distal colon. Findings are most suggestive of an infectious colitis but remain nonspecific. Correlate with any risk factors for infectious colitis such as C difficile. 2. Distension of the biliary tree is similar to imaging from November of 2022 but is associated with pneumobilia following stent placement. Correlate with laboratory values to determine whether this persistent distension may be of clinical significance, favored to represent capacious system post ERCP and stent placement with expected pneumobilia. 3. Enlarging  lesions in the liver now appear more necrotic than on previous imaging studies. 4. Known colonic neoplasm is not as conspicuous as on previous imaging and nodular areas in the vicinity of the tumor appear diminished. 5. Basilar nodularity at the lung bases, may be infectious or inflammatory or metastatic. Suggest close attention on follow-up. 6. Thrombus associated with the patient's Port-A-Cath in the SVC as discussed. 7. Aortic atherosclerosis. 8.  * onc * Aortic Atherosclerosis (ICD10-I70.0). These results were called by telephone at the time of interpretation on 04/20/2021 at 1:13 pm to provider [facility] , who verbally acknowledged these results. Electronically Signed   By: Zetta Bills M.D.   On: 04/20/2021 13:12    ASSESSMENT & PLAN Norma Morales is a 76 year old female who returns for follow-up for metastatic colon cancer.    The mainstay treatment will be chemotherapy with the rationale to prolong life and help improve cancer related symptoms.  This is unfortunately not a curable disease.  The treatment regimen includes infusional 5-FU, oxaliplatin and bevacizumab given once every 2 weeks.  The dose, frequency and common side effects were discussed with the patient.  Common side effects include fatigue, appetite loss, nausea, diarrhea, cold sensitivity, neuropathy, poor wound healing and increased risk of bleeding.  We will plan to obtain repeat imaging in 2 to 3 months to assess treatment response.  Additionally, we have additional biomarkers including KRAS WT, NRAS WT, BRAF mutation positive and MSS.   # Colitis- resolved -- Patient had been having severe nausea, vomiting, and diarrhea in the interim since her last visit.  Has been particularly bad in the last week --CT imaging shows concern for colitis --We will provide patient with brief course of Cipro and Flagyl in order to treat the colitis -- Symptoms have improved after completion of antibiotic course.  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 --CT scan on 04/18/2021 showed concern for progression of disease. Plan: --HOLD FOLFOX plus Bevacizumab -- CT scan unfortunately shows progression of disease. -- Recommend transitioning to comfort based care.  Referral placed to hospice  today. --RTC in 4 weeks to assure she is recovering and to determine next course of action.  # Elevated Bilirubin/LFTs, normalized # Elevated Alk Phos -- Patient was admitted and underwent ERCP with biliary stent placement. --Solid improvement in her LFTs. Currently normalized with elevated Alk phos at 467  --Okay to proceed with chemotherapy today.  #RUQ abdominal pain: --Likely secondary to liver metastases --Currently on oxycodone 5 mg q 8 hours with improvement of pain. Refill sent.  --Advised patient to be aggressive with her bowel regimen to minimize constipation.   #Supportive Care -- chemotherapy education complete -- port placed -- zofran 10m q8H PRN and compazine 134mPO q6H for nausea -- EMLA cream for port -- oxycodone 32m27m q8H PRN for pain control (as above)  No orders of the defined types were placed in this encounter.  All questions were answered. The patient knows to call the clinic with any problems, questions or concerns.  I have spent a total of 30 minutes minutes of face-to-face and non-face-to-face time, preparing to see the patient, obtaining and/or reviewing separately obtained history, performing a medically appropriate examination, counseling and educating the patient, ordering medications, referring and communicating with other health care professionals, documenting clinical information in the electronic health record, and  care coordination.   JohLedell PeoplesD Department of Hematology/Oncology [place] [facility] Centerone: [phone]: [phone]: [email or web address]

## 2021-05-02 NOTE — Progress Notes (Signed)
Hospice referral called into [place]. The facility will be reaching out to the family later today.  ?

## 2021-05-02 NOTE — Patient Instructions (Signed)

## 2021-05-03 ENCOUNTER — Telehealth: Payer: Self-pay | Admitting: Hematology and Oncology

## 2021-05-03 NOTE — Telephone Encounter (Signed)
Scheduled per 3/7 los, pt has been called and confirmed  ?

## 2021-05-04 ENCOUNTER — Inpatient Hospital Stay: Payer: Medicare Other | Admitting: Dietician

## 2021-05-04 ENCOUNTER — Inpatient Hospital Stay: Payer: Medicare Other

## 2021-05-04 ENCOUNTER — Inpatient Hospital Stay: Payer: Medicare Other | Admitting: Hematology and Oncology

## 2021-05-06 ENCOUNTER — Inpatient Hospital Stay: Payer: Medicare Other

## 2021-05-16 ENCOUNTER — Telehealth: Payer: Self-pay | Admitting: Physician Assistant

## 2021-05-16 NOTE — Telephone Encounter (Signed)
I called the patient this morning. She was scheduled to see me on 05/18/21. It looks like this was an old appointment that had note been removed. The patient did inform me that she has transitioned to hospice. They have come out and seen her several times. She will not be coming in on 05/18/21 for her appointment. However, she would like to keep the appointment with Dr. Lorenso Courier on 05/29/21 and will call us based on how she feels if she does not feel like she needs to be seen. She states she has actually been feeling better recently since stopping treatment.  ?

## 2021-05-18 ENCOUNTER — Inpatient Hospital Stay: Payer: Medicare Other

## 2021-05-18 ENCOUNTER — Inpatient Hospital Stay: Payer: Medicare Other | Admitting: Physician Assistant

## 2021-05-20 ENCOUNTER — Inpatient Hospital Stay: Payer: Medicare Other

## 2021-05-29 ENCOUNTER — Inpatient Hospital Stay: Payer: Medicare Other

## 2021-05-29 ENCOUNTER — Telehealth: Payer: Self-pay

## 2021-05-29 ENCOUNTER — Inpatient Hospital Stay: Payer: Medicare Other | Attending: Physician Assistant | Admitting: Hematology and Oncology

## 2021-05-29 ENCOUNTER — Other Ambulatory Visit: Payer: Self-pay

## 2021-05-29 VITALS — BP 121/66 | HR 68 | Temp 98.1°F | Resp 18 | Wt 121.9 lb

## 2021-05-29 DIAGNOSIS — R112 Nausea with vomiting, unspecified: Secondary | ICD-10-CM | POA: Diagnosis not present

## 2021-05-29 DIAGNOSIS — Z95828 Presence of other vascular implants and grafts: Secondary | ICD-10-CM

## 2021-05-29 DIAGNOSIS — C189 Malignant neoplasm of colon, unspecified: Secondary | ICD-10-CM

## 2021-05-29 DIAGNOSIS — R748 Abnormal levels of other serum enzymes: Secondary | ICD-10-CM | POA: Diagnosis not present

## 2021-05-29 DIAGNOSIS — G893 Neoplasm related pain (acute) (chronic): Secondary | ICD-10-CM | POA: Diagnosis not present

## 2021-05-29 DIAGNOSIS — R17 Unspecified jaundice: Secondary | ICD-10-CM

## 2021-05-29 DIAGNOSIS — C787 Secondary malignant neoplasm of liver and intrahepatic bile duct: Secondary | ICD-10-CM | POA: Insufficient documentation

## 2021-05-29 DIAGNOSIS — Z79899 Other long term (current) drug therapy: Secondary | ICD-10-CM | POA: Diagnosis not present

## 2021-05-29 DIAGNOSIS — C183 Malignant neoplasm of hepatic flexure: Secondary | ICD-10-CM | POA: Diagnosis present

## 2021-05-29 LAB — CBC WITH DIFFERENTIAL (CANCER CENTER ONLY)
Abs Immature Granulocytes: 0.11 10*3/uL — ABNORMAL HIGH (ref 0.00–0.07)
Basophils Absolute: 0 10*3/uL (ref 0.0–0.1)
Basophils Relative: 0 %
Eosinophils Absolute: 0 10*3/uL (ref 0.0–0.5)
Eosinophils Relative: 0 %
HCT: 27.9 % — ABNORMAL LOW (ref 36.0–46.0)
Hemoglobin: 9.4 g/dL — ABNORMAL LOW (ref 12.0–15.0)
Immature Granulocytes: 1 %
Lymphocytes Relative: 2 %
Lymphs Abs: 0.3 10*3/uL — ABNORMAL LOW (ref 0.7–4.0)
MCH: 28.1 pg (ref 26.0–34.0)
MCHC: 33.7 g/dL (ref 30.0–36.0)
MCV: 83.3 fL (ref 80.0–100.0)
Monocytes Absolute: 0.5 10*3/uL (ref 0.1–1.0)
Monocytes Relative: 4 %
Neutro Abs: 10.6 10*3/uL — ABNORMAL HIGH (ref 1.7–7.7)
Neutrophils Relative %: 93 %
Platelet Count: 94 10*3/uL — ABNORMAL LOW (ref 150–400)
RBC: 3.35 MIL/uL — ABNORMAL LOW (ref 3.87–5.11)
RDW: 20 % — ABNORMAL HIGH (ref 11.5–15.5)
WBC Count: 11.4 10*3/uL — ABNORMAL HIGH (ref 4.0–10.5)
nRBC: 0 % (ref 0.0–0.2)

## 2021-05-29 LAB — CMP (CANCER CENTER ONLY)
ALT: 46 U/L — ABNORMAL HIGH (ref 0–44)
AST: 44 U/L — ABNORMAL HIGH (ref 15–41)
Albumin: 2.5 g/dL — ABNORMAL LOW (ref 3.5–5.0)
Alkaline Phosphatase: 610 U/L — ABNORMAL HIGH (ref 38–126)
Anion gap: 9 (ref 5–15)
BUN: 18 mg/dL (ref 8–23)
CO2: 26 mmol/L (ref 22–32)
Calcium: 8.1 mg/dL — ABNORMAL LOW (ref 8.9–10.3)
Chloride: 92 mmol/L — ABNORMAL LOW (ref 98–111)
Creatinine: 0.79 mg/dL (ref 0.44–1.00)
GFR, Estimated: >60 mL/min (ref >60)
Glucose, Bld: 140 mg/dL — ABNORMAL HIGH (ref 70–99)
Potassium: 4.1 mmol/L (ref 3.5–5.1)
Sodium: 127 mmol/L — ABNORMAL LOW (ref 135–145)
Total Bilirubin: 9 mg/dL (ref 0.3–1.2)
Total Protein: 5.6 g/dL — ABNORMAL LOW (ref 6.5–8.1)

## 2021-05-29 LAB — CEA (IN HOUSE-CHCC): CEA (CHCC-In House): 36.45 ng/mL — ABNORMAL HIGH (ref 0.00–5.00)

## 2021-05-29 MED ORDER — HEPARIN SOD (PORK) LOCK FLUSH 100 UNIT/ML IV SOLN
500.0000 [IU] | Freq: Once | INTRAVENOUS | Status: AC
  Administered 2021-05-29: 500 [IU]

## 2021-05-29 MED ORDER — SODIUM CHLORIDE 0.9% FLUSH
10.0000 mL | Freq: Once | INTRAVENOUS | Status: AC
  Administered 2021-05-29: 10 mL

## 2021-05-29 NOTE — Telephone Encounter (Signed)
CRITICAL VALUE STICKER ? ?CRITICAL VALUE: Total Bili = 9.0 ? ?RECEIVER (on-site recipient of call): Yetta Glassman, CMA ? ?DATE & TIME NOTIFIED: 05/29/21 at 2:51pm ? ?MESSENGER (representative from lab): Heather ? ?MD NOTIFIED: Lorenso Courier ? ?TIME OF NOTIFICATION: 05/29/21 at 2:53pm ? ?RESPONSE: Notification given to Joesphine Bare., RN for follow-up with pt and provider. ? ?

## 2021-05-29 NOTE — Progress Notes (Signed)
?[place] ?[phone]   [phone] ? ?PROGRESS NOTE ? ?Patient Care Team: ?Jene Every, MD as PCP - General (Family Medicine) ? 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  ?  ?2) 12/28/2020: CT abdomen/pelvis: Short segment colonic wall thickening at the hepatic flexure with pericolonic stranding and nodularity, concerning for primary colonic neoplasm. Recommend correlation with colonoscopy.  Findings of metastatic disease including multifocal omental, mesenteric, and peritoneal/retroperitoneal soft tissue implants, portocaval and retroperitoneal lymphadenopathy and multiple hepatic metastases. Question lucent lesion involving the S2 vertebral body with soft tissue density extending into the adjacent canal and partially expanded left S2 neural foramina. Recommend dedicated MRI of the sacrum for further characterization. Subpleural 0.7 cm nodule in the lingula is indeterminate. Further evaluation with dedicated CT chest is recommended.  A 1.0 cm lesion in the pancreatic neck without pancreatic ductal dilatation. This could be further characterized with MRI of the abdomen. ?  ?3) 01/04/2021: Establish care at [facility].  ? ?4) 01/06/2021: Underwent colonoscopy with Dr. Kathreen Devoid [facility]).  Findings revealed a circumferential, near obstructing colon mass concerning for malignancy.  Pathology revealed superficial fragments of colonic mucosa with extensive high-grade dysplasia. ? 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  No suspicious osseous lesion. ? ?6) 01/11/2021: CT chest: Multiple small pulmonary densities in the bilateral lower lobes of indeterminate etiology.  Ill-defined hypodense lesion in the liver measuring at least 5.5  x 4.0 cm as well as few small hypodense lesions concerning for malignant process.  No mediastinal lymphadenopathy.  No suspicious osseous lesions ? ?7) 01/17/2021: Underwent port placement and liver biopsy.  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 ? ?8) 01/26/2021: intended start of FOLFOX +Bevacizumab chemotherapy. Held due to Bilirubin 9.0, AST 252, ALT 282. Admitted to [facility] hospital.  ? ?9) 02/09/2021: Cycle 1 Day 1 of FOLFOX + Bevacizumab  ?10) 02/23/2021: Cycle 2 Day 1 of FOLFOX + Bevacizumab  ?11) 03/09/2021: Cycle 3 Day 1 of FOLFOX + Bevacizumab  ?12) 03/23/2021: Cycle 4 Day 1 of FOLFOX + Bevacizumab  ?13) 04/06/2021: Cycle 5 Day 1 of FOLFOX + Bevacizumab ?14) 04/18/2021: CT chest abdomen pelvis shows concern for progression of disease. ? ?Interval History:  ?Norma Morales returns for follow-up for metastatic colon cancer.  Her last visit was on 05/02/2021.  Since that time she has continued on hospice care.  ? ?On exam today, Norma Morales is accompanied by her husband.  She reports that her constipation is currently under good control.  She notes that she has been taking Senokot as been helping with this.  She notes overall she has "been okay".  She does that she is sleeping quite a lot.  Unfortunately she has been having episodes of shaking where she "could not get warm".  She took some Tylenol which did not help to relieve this.  Her fatigue has been pretty severe and her appetite has been very poor though she has been eating ice cream.  She notes no swelling in her lower extremities and reports that her pain is currently a 0 out of 10 in severity.  She notes that he does have some cramps in her abdomen while in bed.  She is not having any episodes of lightheadedness or dizziness.  She currently has a good supply of her  pain medications.  She notes that she does not wish to return to clinic and we support this decision.  We are happy to be available for her in the event that  she would need anything.  A full 10 point ROS is listed below. ? ? ?MEDICAL HISTORY:  ?Past Medical History:  ?Diagnosis Date  ? Hyperlipidemia   ? met colon ca to liver 12/2020  ? colon  ? ? ?SURGICAL HISTORY: ?Past Surgical History:  ?Procedure Laterality Date  ? BILIARY STENT PLACEMENT N/A 01/27/2021  ? Procedure: BILIARY STENT PLACEMENT;  Surgeon: Clarene Essex, MD;  Location: [facility]OS[facility];  Service: Endoscopy;  Laterality: N/A;  ? ERCP N/A 01/27/2021  ? Procedure: ENDOSCOPIC RETROGRADE CHOLANGIOPANCREATOGRAPHY (ERCP);  Surgeon: Clarene Essex, MD;  Location: [facility];  Service: Endoscopy;  Laterality: N/A;  ? IR IMAGING GUIDED PORT INSERTION  01/17/2021  ? IR US GUIDE BX ASP/DRAIN  01/17/2021  ? SPHINCTEROTOMY  01/27/2021  ? Procedure: SPHINCTEROTOMY;  Surgeon: Clarene Essex, MD;  Location: [facility];  Service: Endoscopy;;  ? TONSILLECTOMY    ? ? ?SOCIAL HISTORY: ?Social History  ? ?Socioeconomic History  ? Marital status: Married  ?  Spouse name: Not on file  ? Number of children: Not on file  ? Years of education: Not on file  ? Highest education level: Not on file  ?Occupational History  ? Not on file  ?Tobacco Use  ? Smoking status: Never  ? Smokeless tobacco: Never  ?Substance and Sexual Activity  ? Alcohol use: Never  ? Drug use: Never  ? Sexual activity: Not on file  ?Other Topics Concern  ? Not on file  ?Social History Narrative  ? Not on file  ? ?Social Determinants of Health  ? ?Financial Resource Strain: Not on file  ?Food Insecurity: Not on file  ?Transportation Needs: Not on file  ?Physical Activity: Not on file  ?Stress: Not on file  ?Social Connections: Not on file  ?Intimate Partner Violence: Not on file  ? ? ?FAMILY HISTORY: ?No family history on file. ? ?ALLERGIES:  is allergic to aspirin. ? ?MEDICATIONS:  ?Current Outpatient Medications  ?Medication Sig Dispense Refill  ? atenolol (TENORMIN) 25 MG tablet Take 25 mg by mouth at bedtime.    ? loperamide (IMODIUM) 2 MG capsule Take 2 mg by mouth as  needed for diarrhea or loose stools.    ? ondansetron (ZOFRAN) 8 MG tablet Take 1 tablet (8 mg total) by mouth every 8 (eight) hours as needed for nausea or vomiting. 60 tablet 3  ? oxyCODONE (ROXICODONE) 5 MG immediate release tablet Take 1 tablet (5 mg total) by mouth every 8 (eight) hours as needed. 60 tablet 0  ? prochlorperazine (COMPAZINE) 10 MG tablet Take 1 tablet (10 mg total) by mouth every 6 (six) hours as needed for nausea or vomiting. 60 tablet 3  ? senna (SENOKOT) 8.6 MG TABS tablet Take 1 tablet (8.6 mg total) by mouth daily as needed for mild constipation. 30 tablet 0  ? ?No current facility-administered medications for this visit.  ? ? ?REVIEW OF SYSTEMS:   ?Constitutional: ( - ) fevers, ( - )  chills , ( - ) night sweats ?Eyes: ( - ) blurriness of vision, ( - ) double vision, ( - ) watery eyes ?Ears, nose, mouth, throat, and face: ( - ) mucositis, ( - ) sore throat ?Respiratory: ( - ) cough, ( - ) dyspnea, ( - ) wheezes ?Cardiovascular: ( - ) palpitation, ( - )  chest discomfort, ( - ) lower extremity swelling ?Gastrointestinal:  ( - ) nausea, ( - ) heartburn, ( - ) change in bowel habits ?Skin: ( - ) abnormal skin rashes ?Lymphatics: ( - ) new lymphadenopathy, ( - ) easy bruising ?Neurological: ( - ) numbness, ( - ) tingling, ( - ) new weaknesses ?Behavioral/Psych: ( - ) mood change, ( - ) new changes  ?All other systems were reviewed with the patient and are negative. ? ?PHYSICAL EXAMINATION: ?ECOG PERFORMANCE STATUS: 1 - Symptomatic but completely ambulatory ? ?Vitals:  ? 05/29/21 1420  ?BP: 121/66  ?Pulse: 68  ?Resp: 18  ?Temp: 98.1 ?F (36.7 ?C)  ?SpO2: 100%  ? ? ?Filed Weights  ? 05/29/21 1420  ?Weight: 121 lb 14.4 oz (55.3 kg)  ? ? ?GENERAL: well appearing Caucasian female in NAD  ?SKIN:  rashes or significant lesions. Jaundice.  ?EYES: conjunctiva are pink and non-injected, sclera icteric ?LUNGS: clear to auscultation and percussion with normal breathing effort ?HEART: regular rate & rhythm  and no murmurs and no lower extremity edema ?Musculoskeletal: no cyanosis of digits and no clubbing  ?PSYCH: alert & oriented x 3, fluent speech ?NEURO: no focal motor/sensory deficits ? ?LABORATORY DAT

## 2021-06-04 ENCOUNTER — Encounter: Payer: Self-pay | Admitting: Hematology and Oncology

## 2021-06-15 ENCOUNTER — Encounter: Payer: Self-pay | Admitting: Hematology and Oncology

## 2021-08-26 DEATH — deceased

## 2022-07-19 NOTE — Progress Notes (Signed)
 Abstraction Result Flowsheet Data    This patient's last AWV date: [facility] Last Medicare Wellness Visit Date: Not Found  This patients last WCC/CPE date: : Not Found      Reason for Encounter  Reason for Encounter: Outreach  Primary Reason for Outreach: AWV  Text Message: No  MyChart Message: No  Outreach Call Outcome: No voicemail available
